# Patient Record
Sex: Female | Born: 1950
Health system: Southern US, Community
[De-identification: ages and names within clinical notes are randomized; demographics above are authoritative.]

## PROBLEM LIST (undated history)

## (undated) DIAGNOSIS — E785 Hyperlipidemia, unspecified: Secondary | ICD-10-CM

## (undated) DIAGNOSIS — M1990 Unspecified osteoarthritis, unspecified site: Secondary | ICD-10-CM

## (undated) DIAGNOSIS — C801 Malignant (primary) neoplasm, unspecified: Secondary | ICD-10-CM

## (undated) DIAGNOSIS — I10 Essential (primary) hypertension: Secondary | ICD-10-CM

## (undated) DIAGNOSIS — R079 Chest pain, unspecified: Principal | ICD-10-CM

## (undated) DIAGNOSIS — J189 Pneumonia, unspecified organism: Secondary | ICD-10-CM

## (undated) HISTORY — PX: ELBOW SURGERY: SHX618

## (undated) HISTORY — DX: Chest pain, unspecified: R07.9

## (undated) HISTORY — PX: BREAST BIOPSY: SHX20

## (undated) HISTORY — PX: APPENDECTOMY: SHX54

## (undated) HISTORY — PX: COLONOSCOPY: SHX174

## (undated) HISTORY — PX: WISDOM TOOTH EXTRACTION: SHX21

## (undated) HISTORY — PX: FOOT SURGERY: SHX648

---
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
## 1998-07-30 ENCOUNTER — Other Ambulatory Visit: Admission: RE | Admit: 1998-07-30 | Discharge: 1998-07-30 | Payer: Self-pay | Admitting: Gynecology

## 1998-08-17 ENCOUNTER — Other Ambulatory Visit: Admission: RE | Admit: 1998-08-17 | Discharge: 1998-08-17 | Payer: Self-pay | Admitting: Gynecology

## 1999-11-19 ENCOUNTER — Other Ambulatory Visit: Admission: RE | Admit: 1999-11-19 | Discharge: 1999-11-19 | Payer: Self-pay | Admitting: Gynecology

## 2000-01-27 ENCOUNTER — Encounter (INDEPENDENT_AMBULATORY_CARE_PROVIDER_SITE_OTHER): Payer: Self-pay | Admitting: *Deleted

## 2000-01-27 ENCOUNTER — Ambulatory Visit (HOSPITAL_BASED_OUTPATIENT_CLINIC_OR_DEPARTMENT_OTHER): Admission: RE | Admit: 2000-01-27 | Discharge: 2000-01-28 | Payer: Self-pay | Admitting: Orthopedic Surgery

## 2000-04-13 ENCOUNTER — Encounter: Admission: RE | Admit: 2000-04-13 | Discharge: 2000-04-13 | Payer: Self-pay | Admitting: Gynecology

## 2000-04-13 ENCOUNTER — Encounter: Payer: Self-pay | Admitting: Gynecology

## 2000-06-01 ENCOUNTER — Other Ambulatory Visit: Admission: RE | Admit: 2000-06-01 | Discharge: 2000-06-01 | Payer: Self-pay | Admitting: Gynecology

## 2001-01-21 ENCOUNTER — Other Ambulatory Visit: Admission: RE | Admit: 2001-01-21 | Discharge: 2001-01-21 | Payer: Self-pay | Admitting: Gynecology

## 2001-02-05 ENCOUNTER — Other Ambulatory Visit: Admission: RE | Admit: 2001-02-05 | Discharge: 2001-02-05 | Payer: Self-pay | Admitting: Gynecology

## 2001-02-05 ENCOUNTER — Encounter (INDEPENDENT_AMBULATORY_CARE_PROVIDER_SITE_OTHER): Payer: Self-pay

## 2001-04-19 ENCOUNTER — Encounter: Payer: Self-pay | Admitting: Gynecology

## 2001-04-19 ENCOUNTER — Encounter: Admission: RE | Admit: 2001-04-19 | Discharge: 2001-04-19 | Payer: Self-pay | Admitting: Gynecology

## 2002-04-20 ENCOUNTER — Encounter: Admission: RE | Admit: 2002-04-20 | Discharge: 2002-04-20 | Payer: Self-pay | Admitting: Gynecology

## 2002-04-20 ENCOUNTER — Encounter: Payer: Self-pay | Admitting: Gynecology

## 2002-05-03 ENCOUNTER — Other Ambulatory Visit: Admission: RE | Admit: 2002-05-03 | Discharge: 2002-05-03 | Payer: Self-pay | Admitting: Gynecology

## 2002-09-22 ENCOUNTER — Ambulatory Visit (HOSPITAL_COMMUNITY): Admission: RE | Admit: 2002-09-22 | Discharge: 2002-09-22 | Payer: Self-pay | Admitting: *Deleted

## 2003-05-09 ENCOUNTER — Encounter: Payer: Self-pay | Admitting: Gynecology

## 2003-05-09 ENCOUNTER — Encounter: Admission: RE | Admit: 2003-05-09 | Discharge: 2003-05-09 | Payer: Self-pay | Admitting: Gynecology

## 2003-07-13 ENCOUNTER — Other Ambulatory Visit: Admission: RE | Admit: 2003-07-13 | Discharge: 2003-07-13 | Payer: Self-pay | Admitting: Gynecology

## 2004-05-27 ENCOUNTER — Encounter: Admission: RE | Admit: 2004-05-27 | Discharge: 2004-05-27 | Payer: Self-pay | Admitting: Gynecology

## 2004-07-30 ENCOUNTER — Other Ambulatory Visit: Admission: RE | Admit: 2004-07-30 | Discharge: 2004-07-30 | Payer: Self-pay | Admitting: Gynecology

## 2005-06-02 ENCOUNTER — Encounter: Admission: RE | Admit: 2005-06-02 | Discharge: 2005-06-02 | Payer: Self-pay | Admitting: Gynecology

## 2005-06-12 ENCOUNTER — Encounter: Admission: RE | Admit: 2005-06-12 | Discharge: 2005-06-12 | Payer: Self-pay | Admitting: Gynecology

## 2006-06-18 ENCOUNTER — Encounter: Admission: RE | Admit: 2006-06-18 | Discharge: 2006-06-18 | Payer: Self-pay | Admitting: Gynecology

## 2006-09-04 ENCOUNTER — Emergency Department (HOSPITAL_COMMUNITY): Admission: EM | Admit: 2006-09-04 | Discharge: 2006-09-04 | Payer: Self-pay | Admitting: *Deleted

## 2007-06-30 ENCOUNTER — Encounter: Admission: RE | Admit: 2007-06-30 | Discharge: 2007-06-30 | Payer: Self-pay | Admitting: Orthopedic Surgery

## 2007-07-20 ENCOUNTER — Encounter: Admission: RE | Admit: 2007-07-20 | Discharge: 2007-07-20 | Payer: Self-pay | Admitting: Gynecology

## 2007-10-11 ENCOUNTER — Encounter: Admission: RE | Admit: 2007-10-11 | Discharge: 2007-10-11 | Payer: Self-pay | Admitting: Family Medicine

## 2007-10-18 ENCOUNTER — Encounter: Admission: RE | Admit: 2007-10-18 | Discharge: 2007-10-18 | Payer: Self-pay | Admitting: Family Medicine

## 2008-01-17 ENCOUNTER — Encounter: Admission: RE | Admit: 2008-01-17 | Discharge: 2008-01-17 | Payer: Self-pay | Admitting: Gastroenterology

## 2008-02-28 ENCOUNTER — Ambulatory Visit (HOSPITAL_COMMUNITY): Admission: RE | Admit: 2008-02-28 | Discharge: 2008-02-28 | Payer: Self-pay | Admitting: General Surgery

## 2008-02-28 ENCOUNTER — Encounter (INDEPENDENT_AMBULATORY_CARE_PROVIDER_SITE_OTHER): Payer: Self-pay | Admitting: General Surgery

## 2008-07-20 ENCOUNTER — Encounter: Admission: RE | Admit: 2008-07-20 | Discharge: 2008-07-20 | Payer: Self-pay | Admitting: Gynecology

## 2009-07-23 ENCOUNTER — Encounter: Admission: RE | Admit: 2009-07-23 | Discharge: 2009-07-23 | Payer: Self-pay | Admitting: Gynecology

## 2010-07-31 ENCOUNTER — Encounter: Admission: RE | Admit: 2010-07-31 | Discharge: 2010-07-31 | Payer: Self-pay | Admitting: Gynecology

## 2011-01-12 ENCOUNTER — Encounter: Payer: Self-pay | Admitting: Gynecology

## 2011-05-06 NOTE — Op Note (Signed)
Wall, Claire                ACCOUNT NO.:  [id number]   MEDICAL RECORD NO.:  [id number]          PATIENT TYPE:  AMB   LOCATION:  SDS                          FACILITY:  [facility]   PHYSICIAN:  Gabrielle Dare. Janee Morn, M.D.DATE OF BIRTH:  [birth year]   DATE OF PROCEDURE:  02/28/2008  DATE OF DISCHARGE:                               OPERATIVE REPORT   PREOPERATIVE DIAGNOSIS:  Appendix mucocele.   POSTOPERATIVE DIAGNOSIS:  Appendix mucocele.   PROCEDURE:  Laparoscopic appendectomy.   SURGEON:  Gabrielle Dare. Janee Morn, M.D.   ANESTHESIA:  General.   HISTORY OF PRESENT ILLNESS:  [name] is a 60 year old female who was  noted to have a cystic mass in her appendix on the CT scan.  This  persisted on a follow-up scan, and recommendation for appendectomy was  made.  She presents today to undergo elective laparoscopic appendectomy.   PROCEDURE IN DETAIL:  Informed consent was obtained.  Patient was  identified in the preoperative holding area, and she received  intravenous antibiotics.  She was brought to the operating room.  General anesthesia was administered.  Her abdomen was prepped and draped  in a sterile fashion.  The infraumbilical region was infiltrated with  0.25% Marcaine with epinephrine.  An infraumbilical incision was made.  Subcutaneous tissues were dissected down, revealing the anterior fascia.  This was divided sharply along the midline, and the peritoneal cavity  was entered under direct vision without difficulty.  A 0 Vicryl purse-  string suture was placed around the fascial opening.  The Hasson trocar  was inserted into the abdomen, and the abdomen was insufflated with  carbon dioxide in a standard fashion.  Under direct vision, a 12 mm left  lower quadrant and a 5 mm right mid abdominal port were placed.  The  0.25% Marcaine with epinephrine was used at all port sites.  Laparoscopic exploration revealed the appendix to not be significantly  inflamed.  The base was dissected  initially and then divided with the  endoscopic GIA stapler with the vascular load, achieving an excellent  closure.  The mesoappendix was then gradually divided with the harmonic  scalpel, achieving excellent hemostasis.  The appendix was quite long  and extended up along the right gutter.  It was carefully dissected away  from surrounding structures, and the mesentery was divided sequentially  with the harmonic scalpel.  The tip was all the way up in the right  upper quadrant.  The appendix was removed in one piece and placed in an  EndoCatch bag and taken out of the abdomen via the left lower quadrant  port site.  It was sent to pathology.  The abdomen was copiously  irrigated.  Hemostasis was insured.  The staple line on the cecum was  inspected, and it appeared completely intact.  The remainder of the  irrigation fluid was evacuated, and it was clear.  No other significant  abnormalities were seen in the right colon.  The ports were then removed  under direct vision.  The pneumoperitoneum was released.  The Hasson  trocar was removed from the abdomen.  The infraumbilical fascia was  closed by time and 0 Vicryl pressuring suture with care not to trap any  intra-abdominal contents.  All three wounds were copiously irrigated.  The left lower quadrant and infraumbilical wound were closed  with a running 4-0 Vicryl subcuticular stitch, then Dermabond was placed  on all three wounds.  Sponge, needle, and instrument counts were all  correct.  The patient tolerated the procedure well without apparent  complication and was taken to the recovery room in stable condition.      Gabrielle Dare Janee Morn, M.D.  Electronically Signed     BET/MEDQ  D:  02/28/2008  T:  02/29/2008  Job:  [id number]   cc:   Shirley Friar, MD  Anna Genre Little, M.D.

## 2011-05-09 NOTE — Op Note (Signed)
. [facility]  Patient:    Claire Wall Visit Number: [id number] MRN: [id number]          Service Type: DSU Location: [facility] Attending Physician:  Marlowe Kays Page Proc. Date: 01/27/00 Admit Date:  01/27/2000                             Operative Report  PREOPERATIVE DIAGNOSIS:  Chronic lateral epicondylitis of left elbow.  POSTOPERATIVE DIAGNOSIS:  Chronic lateral epicondylitis of left elbow.  OPERATION:  Reconstruction of common extensor tendon with partial lateral epicondylectomy, left elbow.  SURGEON:  Illene Labrador. Aplington, M.D.  ASSISTANT:  Nurse.  ANESTHESIA:  General.  JUSTIFICATION FOR PROCEDURE:  She had a similar problem on the right elbow years ago which I repaired, and she has done well with it.  She has had progressive pain over the lateral left elbow, refractory to nonsurgical treatment with x-rays being unremarkable, and she is here today for a similar procedure on the left.  See operative description below for pathology.  DESCRIPTION OF PROCEDURE:  After satisfactory general anesthesia, pneumatic tourniquet and Betadine prep from wrist ________ was draped as a sterile field. I marked out a curved incision midway between the lateral epicondyle and the olecranon.  The incision was carried down through the fascia at this level and he common extensor tendon exposed.  Externally, there did not appear to be anything unusual except for several bleeding vessels consistent with chronic inflammation. First, the cutting cautery and then with a 15 knife blade, I made a horizontal,  U-type incision, dissecting the common extensor tendon off the lateral epicondyle. I got close to the elbow joint.  There was a significant gap in the common extensor tendon with glistening chronic fibrinous changes.  All of this was consistent with a pain pattern.  I stripped the tendon back to the elbow joint which was examined and was found  to be normal.  With sharp dissection, I then dissected out all the chronically damaged common extensor tendon and sent it to pathology, leaving healthy glistening tendon remaining.  With the 5 inch curved osteotome, I then performed a partial lateral epicondylectomy, getting a nice raw bleeding bone surface, to which I could attach the debrided common extensor tendon.  I made two parallel drill holes proximally at the lateral epicondyle which remained, and then brought a #1 Vicryl suture through the top hole posteriorly and proximally, up through the hole through the raw bone, and then rolled it through the common extensor tendon, and then brought it back out through the second inferior two holes.  With some traction with a hemostat on the suture and the elbow flexed, I then brought the common extensor tendon over the raw bone, and repaired the wings of the U with multiple interrupted #1 Vicryl, giving a nice secure reattachment.  I then tied the suture through bone.  The wound was irrigated with sterile saline, as it had been throughout the case, and I then reapproximated the fascia with interrupted 3-0 Vicryl.  All soft tissues were infiltrated with 0.5% plain Marcaine.  She was given 30 mg of Toradol IV.  I then continued the closure subcutaneously with 3-0 Vicryl and the skin with Steri-Strips.  A dry sterile dressing, elbow pad with a long arm splint and cast were applied.  She tolerated the procedure well and was taken to the recovery room in satisfactory condition with no known complications.  Attending Physician:  Joaquin Courts DD:  01/27/00 TD:  01/28/00 Job: [id number]

## 2011-07-09 ENCOUNTER — Other Ambulatory Visit: Payer: Self-pay | Admitting: Gynecology

## 2011-07-09 DIAGNOSIS — Z1231 Encounter for screening mammogram for malignant neoplasm of breast: Secondary | ICD-10-CM

## 2011-08-08 ENCOUNTER — Ambulatory Visit
Admission: RE | Admit: 2011-08-08 | Discharge: 2011-08-08 | Disposition: A | Discharge status: Home / Self Care | Payer: Managed Care, Other (non HMO) | Source: Ambulatory Visit | Attending: Gynecology | Admitting: Gynecology

## 2011-08-08 DIAGNOSIS — Z1231 Encounter for screening mammogram for malignant neoplasm of breast: Secondary | ICD-10-CM

## 2011-09-15 LAB — BASIC METABOLIC PANEL
BUN: 11
CO2: 30
Creatinine, Ser: 0.69
GFR calc non Af Amer: >60
Potassium: 5.1

## 2011-09-15 LAB — CBC
Hemoglobin: 14
MCHC: 34
RBC: 4.69
RDW: 14
WBC: 6.7

## 2011-10-20 ENCOUNTER — Other Ambulatory Visit: Payer: Self-pay | Admitting: Gynecology

## 2012-09-23 ENCOUNTER — Other Ambulatory Visit: Payer: Self-pay | Admitting: Gynecology

## 2012-09-23 DIAGNOSIS — Z1231 Encounter for screening mammogram for malignant neoplasm of breast: Secondary | ICD-10-CM

## 2012-10-18 ENCOUNTER — Ambulatory Visit
Admission: RE | Admit: 2012-10-18 | Discharge: 2012-10-18 | Disposition: A | Discharge status: Home / Self Care | Payer: BC Managed Care – PPO | Source: Ambulatory Visit | Attending: Gynecology | Admitting: Gynecology

## 2012-10-18 DIAGNOSIS — Z1231 Encounter for screening mammogram for malignant neoplasm of breast: Secondary | ICD-10-CM

## 2013-03-21 ENCOUNTER — Other Ambulatory Visit: Payer: Self-pay | Admitting: Gastroenterology

## 2013-11-16 ENCOUNTER — Other Ambulatory Visit: Payer: Self-pay

## 2013-11-16 DIAGNOSIS — Z1231 Encounter for screening mammogram for malignant neoplasm of breast: Secondary | ICD-10-CM

## 2013-12-22 DIAGNOSIS — R079 Chest pain, unspecified: Secondary | ICD-10-CM

## 2013-12-22 HISTORY — DX: Chest pain, unspecified: R07.9

## 2013-12-26 ENCOUNTER — Ambulatory Visit
Admission: RE | Admit: 2013-12-26 | Discharge: 2013-12-26 | Disposition: A | Discharge status: Home / Self Care | Payer: BC Managed Care – PPO | Source: Ambulatory Visit

## 2013-12-26 DIAGNOSIS — Z1231 Encounter for screening mammogram for malignant neoplasm of breast: Secondary | ICD-10-CM

## 2014-02-22 ENCOUNTER — Encounter (HOSPITAL_COMMUNITY): Payer: Self-pay | Admitting: Emergency Medicine

## 2014-02-22 ENCOUNTER — Emergency Department (HOSPITAL_COMMUNITY)
Admission: EM | Admit: 2014-02-22 | Discharge: 2014-02-22 | Disposition: A | Discharge status: Home / Self Care | Payer: BC Managed Care – PPO | Attending: Emergency Medicine | Admitting: Emergency Medicine

## 2014-02-22 ENCOUNTER — Emergency Department (HOSPITAL_COMMUNITY): Payer: BC Managed Care – PPO

## 2014-02-22 DIAGNOSIS — R072 Precordial pain: Secondary | ICD-10-CM | POA: Insufficient documentation

## 2014-02-22 DIAGNOSIS — I10 Essential (primary) hypertension: Secondary | ICD-10-CM | POA: Insufficient documentation

## 2014-02-22 DIAGNOSIS — R51 Headache: Secondary | ICD-10-CM | POA: Insufficient documentation

## 2014-02-22 DIAGNOSIS — R079 Chest pain, unspecified: Secondary | ICD-10-CM

## 2014-02-22 DIAGNOSIS — Z888 Allergy status to other drugs, medicaments and biological substances status: Secondary | ICD-10-CM | POA: Insufficient documentation

## 2014-02-22 DIAGNOSIS — Z79899 Other long term (current) drug therapy: Secondary | ICD-10-CM | POA: Insufficient documentation

## 2014-02-22 DIAGNOSIS — R209 Unspecified disturbances of skin sensation: Secondary | ICD-10-CM | POA: Insufficient documentation

## 2014-02-22 HISTORY — DX: Essential (primary) hypertension: I10

## 2014-02-22 LAB — I-STAT TROPONIN, ED: Troponin i, poc: 0.01 ng/mL (ref 0.00–0.08)

## 2014-02-22 LAB — CBC
HEMATOCRIT: 37.6 % (ref 36.0–46.0)
Hemoglobin: 12.8 g/dL (ref 12.0–15.0)
MCH: 30.3 pg (ref 26.0–34.0)
MCHC: 34 g/dL (ref 30.0–36.0)
MCV: 89.1 fL (ref 78.0–100.0)
Platelets: 194 10*3/uL (ref 150–400)
RBC: 4.22 MIL/uL (ref 3.87–5.11)
RDW: 13.3 % (ref 11.5–15.5)
WBC: 4.4 10*3/uL (ref 4.0–10.5)

## 2014-02-22 LAB — BASIC METABOLIC PANEL
BUN: 13 mg/dL (ref 6–23)
CHLORIDE: 105 meq/L (ref 96–112)
CO2: 24 mEq/L (ref 19–32)
Calcium: 9.5 mg/dL (ref 8.4–10.5)
Creatinine, Ser: 0.55 mg/dL (ref 0.50–1.10)
GFR calc Af Amer: >90 mL/min (ref >90)
GFR calc non Af Amer: >90 mL/min (ref >90)
GLUCOSE: 102 mg/dL — AB (ref 70–99)
POTASSIUM: 4.2 meq/L (ref 3.7–5.3)
SODIUM: 141 meq/L (ref 137–147)

## 2014-02-22 LAB — TROPONIN I

## 2014-02-22 MED ORDER — ONDANSETRON HCL 4 MG/2ML IJ SOLN: 4.0000 mg | Freq: Once | INTRAMUSCULAR | Status: DC

## 2014-02-22 MED ORDER — MORPHINE SULFATE 2 MG/ML IJ SOLN: 2.0000 mg | Freq: Once | INTRAMUSCULAR | Status: DC

## 2014-02-22 MED ORDER — SODIUM CHLORIDE 0.9 % IV BOLUS (SEPSIS)
1000.0000 mL | Freq: Once | INTRAVENOUS | Status: AC
Start: 2014-02-22 — End: 2014-02-22
  Administered 2014-02-22: 1000 mL via INTRAVENOUS

## 2014-02-22 MED ORDER — MORPHINE SULFATE 2 MG/ML IJ SOLN
2.0000 mg | Freq: Once | INTRAMUSCULAR | Status: AC
  Administered 2014-02-22: 2 mg via INTRAVENOUS
  Filled 2014-02-22: qty 1

## 2014-02-22 MED ORDER — NITROGLYCERIN 0.4 MG SL SUBL: 0.4000 mg | SUBLINGUAL_TABLET | SUBLINGUAL | Status: DC | PRN

## 2014-02-22 MED ORDER — ONDANSETRON HCL 4 MG/2ML IJ SOLN
4.0000 mg | Freq: Once | INTRAMUSCULAR | Status: AC
Start: 2014-02-22 — End: 2014-02-22
  Administered 2014-02-22: 4 mg via INTRAVENOUS
  Filled 2014-02-22: qty 2

## 2014-02-22 MED ORDER — ASPIRIN 325 MG PO TABS
325.0000 mg | ORAL_TABLET | ORAL | Status: AC
  Administered 2014-02-22: 325 mg via ORAL
  Filled 2014-02-22: qty 1

## 2014-02-22 NOTE — ED Notes (Signed)
Vital signs stable. 

## 2014-02-22 NOTE — Discharge Instructions (Signed)
Please call your doctor for a followup appointment within 24-48 hours. When you talk to your doctor please let them know that you were seen in the emergency department and have them acquire all of your records so that they can discuss the findings with you and formulate a treatment plan to fully care for your new and ongoing problems. Please call and set-up an appointment with your primary care provider to be re-assessed within the nect 24-48 hours Please call and set-up an appointment with Cardiology - will most likely need to get echocardiogram performed Please rest and stay hydrated Can take baby aspirin, 81 mg, daily Please continue to monitor symptoms closely and if symptoms are to worsen or change (fever greater than 101, chills, chest pain, shortness of breath, difficulty breathing, neck pain, pain radiating down the left arm,, sweating, dizziness, fainting) please report back to emergency apartment immediately   Chest Pain (Nonspecific) It is often hard to give a specific diagnosis for the cause of chest pain. There is always a chance that your pain could be related to something serious, such as a heart attack or a blood clot in the lungs. You need to follow up with your caregiver for further evaluation. CAUSES   Heartburn.  Pneumonia or bronchitis.  Anxiety or stress.  Inflammation around your heart (pericarditis) or lung (pleuritis or pleurisy).  A blood clot in the lung.  A collapsed lung (pneumothorax). It can develop suddenly on its own (spontaneous pneumothorax) or from injury (trauma) to the chest.  Shingles infection (herpes zoster virus). The chest wall is composed of bones, muscles, and cartilage. Any of these can be the source of the pain.  The bones can be bruised by injury.  The muscles or cartilage can be strained by coughing or overwork.  The cartilage can be affected by inflammation and become sore (costochondritis). DIAGNOSIS  Lab tests or other studies, such  as X-rays, electrocardiography, stress testing, or cardiac imaging, may be needed to find the cause of your pain.  TREATMENT   Treatment depends on what may be causing your chest pain. Treatment may include:  Acid blockers for heartburn.  Anti-inflammatory medicine.  Pain medicine for inflammatory conditions.  Antibiotics if an infection is present.  You may be advised to change lifestyle habits. This includes stopping smoking and avoiding alcohol, caffeine, and chocolate.  You may be advised to keep your head raised (elevated) when sleeping. This reduces the chance of acid going backward from your stomach into your esophagus.  Most of the time, nonspecific chest pain will improve within 2 to 3 days with rest and mild pain medicine. HOME CARE INSTRUCTIONS   If antibiotics were prescribed, take your antibiotics as directed. Finish them even if you start to feel better.  For the next few days, avoid physical activities that bring on chest pain. Continue physical activities as directed.  Do not smoke.  Avoid drinking alcohol.  Only take over-the-counter or prescription medicine for pain, discomfort, or fever as directed by your caregiver.  Follow your caregiver's suggestions for further testing if your chest pain does not go away.  Keep any follow-up appointments you made. If you do not go to an appointment, you could develop lasting (chronic) problems with pain. If there is any problem keeping an appointment, you must call to reschedule. SEEK MEDICAL CARE IF:   You think you are having problems from the medicine you are taking. Read your medicine instructions carefully.  Your chest pain does not go away,  even after treatment.  You develop a rash with blisters on your chest. SEEK IMMEDIATE MEDICAL CARE IF:   You have increased chest pain or pain that spreads to your arm, neck, jaw, back, or abdomen.  You develop shortness of breath, an increasing cough, or you are coughing up  blood.  You have severe back or abdominal pain, feel nauseous, or vomit.  You develop severe weakness, fainting, or chills.  You have a fever. THIS IS AN EMERGENCY. Do not wait to see if the pain will go away. Get medical help at once. Call your local emergency services (911 in [place] Do not drive yourself to the hospital. MAKE SURE YOU:   Understand these instructions.  Will watch your condition.  Will get help right away if you are not doing well or get worse. Document Released: 09/17/2005 Document Revised: 03/01/2012 Document Reviewed: 07/13/2008 [facility] Patient Information 2014 [place].

## 2014-02-22 NOTE — ED Notes (Signed)
Marissa, PA at bedside. 

## 2014-02-22 NOTE — ED Provider Notes (Signed)
CSN: [id number]     Arrival date & time 02/22/14  4008 History   First MD Initiated Contact with Patient 02/22/14 [phone]     Chief Complaint  Patient presents with  . Chest Pain     (Consider location/radiation/quality/duration/timing/severity/associated sxs/prior Treatment) The history is provided by the patient. No language interpreter was used.  Claire Wall is a 63 year old female with past medical history of hypertension, breast biopsy of the left breast with negative findings of cancer presenting to the ED with abrupt onset of chest pain at approximately 4:45 AM this morning. Stated that the pain with a stabbing sensation to the Center the chest is constant. Stated that the pain has now been alleviated with the use of aspirin, is now described as a dull sensation that worsens with deep inhalation. Stated that when she takes a deep breath in the pain as a stabbing sensation. Stated that she had a headache with the onset of the chest pain that has now been alleviated. Denied radiation. Stated that she has family history of cardiac issues-father had many cardiac issues with CABG x3. Denied shortness of breath, difficulty breathing, nausea, vomiting, diarrhea, syncope, visual distortions, dizziness, diaphoresis. Denied smoking. Reported history of birth control many years ago. PCP Dr. Rex Kras  Past Medical History  Diagnosis Date  . Hypertension    Past Surgical History  Procedure Laterality Date  . Appendectomy    . Breast biopsy Left   . Elbow surgery     No family history on file. History  Substance Use Topics  . Smoking status: Never Smoker   . Smokeless tobacco: Not on file  . Alcohol Use: Yes   OB History   Grav Para Term Preterm Abortions TAB SAB Ect Mult Living                 Review of Systems  Constitutional: Negative for fever and chills.  Respiratory: Negative for chest tightness and shortness of breath.   Cardiovascular: Positive for chest pain.  Gastrointestinal:  Negative for nausea, vomiting, abdominal pain and diarrhea.  Neurological: Positive for headaches. Negative for syncope and weakness.  All other systems reviewed and are negative.      Allergies  Codeine  Home Medications   Current Outpatient Rx  Name  Route  Sig  Dispense  Refill  . ramipril (ALTACE) 10 MG capsule   Oral   Take 10 mg by mouth daily.          . simvastatin (ZOCOR) 20 MG tablet   Oral   Take 20 mg by mouth daily.           BP 120/74  Pulse 65  Temp(Src) 97.7 F (36.5 C) (Oral)  Resp 17  SpO2 97% Physical Exam  Nursing note and vitals reviewed. Constitutional: She is oriented to person, place, and time. She appears well-developed and well-nourished. No distress.  HENT:  Head: Normocephalic.  Mouth/Throat: Oropharynx is clear and moist. No oropharyngeal exudate.  Eyes: Conjunctivae and EOM are normal. Pupils are equal, round, and reactive to light. Right eye exhibits no discharge. Left eye exhibits no discharge.  Neck: Normal range of motion. Neck supple. No tracheal deviation present.  Cardiovascular: Normal rate, regular rhythm and normal heart sounds.  Exam reveals no friction rub.   No murmur heard. Pulses:      Radial pulses are 2+ on the right side, and 2+ on the left side.       Dorsalis pedis pulses are 2+ on  the right side, and 2+ on the left side.  Negative swelling or pitting edema localized to the lower extremities bilaterally  Pulmonary/Chest: Effort normal and breath sounds normal. No respiratory distress. She has no wheezes. She has no rales. She exhibits tenderness (Pain reproducible upon palpation to the sternum.).    Musculoskeletal: Normal range of motion.  Full ROM to upper and lower extremities without difficulty noted, negative ataxia noted.  Lymphadenopathy:    She has no cervical adenopathy.  Neurological: She is alert and oriented to person, place, and time. No cranial nerve deficit. She exhibits normal muscle tone.  Coordination normal.  Cranial nerves III-XII grossly intact Strength 5+/5+ to upper and lower extremities bilaterally with resistance applied, equal distribution noted.  Skin: Skin is warm and dry. No rash noted. She is not diaphoretic. No erythema.  Psychiatric: She has a normal mood and affect. Her behavior is normal. Thought content normal.    ED Course  Procedures (including critical care time)  11:27 AM This provider re-assessed the patient. Patient reported that she is feeling well and ready to go home. Patient is chest pain free.  Discussed labs, imaging and EKG in great detail with attending physician who agreed to patient being discharged.  Results for orders placed during the hospital encounter of 02/22/14  CBC      Result Value Ref Range   WBC 4.4  4.0 - 10.5 K/uL   RBC 4.22  3.87 - 5.11 MIL/uL   Hemoglobin 12.8  12.0 - 15.0 g/dL   HCT 37.6  36.0 - 46.0 %   MCV 89.1  78.0 - 100.0 fL   MCH 30.3  26.0 - 34.0 pg   MCHC 34.0  30.0 - 36.0 g/dL   RDW 13.3  11.5 - 15.5 %   Platelets 194  150 - 400 K/uL  BASIC METABOLIC PANEL      Result Value Ref Range   Sodium 141  137 - 147 mEq/L   Potassium 4.2  3.7 - 5.3 mEq/L   Chloride 105  96 - 112 mEq/L   CO2 24  19 - 32 mEq/L   Glucose, Bld 102 (*) 70 - 99 mg/dL   BUN 13  6 - 23 mg/dL   Creatinine, Ser 0.55  0.50 - 1.10 mg/dL   Calcium 9.5  8.4 - 10.5 mg/dL   GFR calc non Af Amer >90  >90 mL/min   GFR calc Af Amer >90  >90 mL/min  TROPONIN I      Result Value Ref Range   Troponin I <0.30  <0.30 ng/mL  TROPONIN I      Result Value Ref Range   Troponin I <0.30  <0.30 ng/mL  I-STAT TROPOININ, ED      Result Value Ref Range   Troponin i, poc 0.01  0.00 - 0.08 ng/mL   Comment 3            Dg Chest 2 View  02/22/2014   CLINICAL DATA:  Chest pain and shortness of breath  EXAM: CHEST  2 VIEW  COMPARISON:  09/04/2006  FINDINGS: Normal heart size and mediastinal contours. No acute infiltrate or edema. No effusion or pneumothorax. No  acute osseous findings.  IMPRESSION: No active cardiopulmonary disease.   Electronically Signed   By: Jorje Guild M.D.   On: 02/22/2014 06:48   Labs Review Labs Reviewed  BASIC METABOLIC PANEL - Abnormal; Notable for the following:    Glucose, Bld 102 (*)    All  other components within normal limits  CBC  TROPONIN I  TROPONIN I  I-STAT TROPOININ, ED   Imaging Review Dg Chest 2 View  02/22/2014   CLINICAL DATA:  Chest pain and shortness of breath  EXAM: CHEST  2 VIEW  COMPARISON:  09/04/2006  FINDINGS: Normal heart size and mediastinal contours. No acute infiltrate or edema. No effusion or pneumothorax. No acute osseous findings.  IMPRESSION: No active cardiopulmonary disease.   Electronically Signed   By: Jorje Guild M.D.   On: 02/22/2014 06:48     EKG Interpretation   Date/Time:  Wednesday February 22 2014 06:19:58 EST Ventricular Rate:  68 PR Interval:  148 QRS Duration: 92 QT Interval:  412 QTC Calculation: 438 R Axis:   84 Text Interpretation:  Normal sinus rhythm Normal ECG No significant change  since last tracing Confirmed by HARRISON  MD, FORREST ([id number]) on 02/22/2014  7:05:46 AM      MDM   Final diagnoses:  Chest pain   Medications  aspirin tablet 325 mg (325 mg Oral Given 02/22/14 0702)  sodium chloride 0.9 % bolus 1,000 mL (1,000 mLs Intravenous New Bag/Given 02/22/14 0835)  morphine 2 MG/ML injection 2 mg (2 mg Intravenous Given 02/22/14 0842)  ondansetron (ZOFRAN) injection 4 mg (4 mg Intravenous Given 02/22/14 0843)   Filed Vitals:   02/22/14 0833 02/22/14 0900 02/22/14 0915 02/22/14 1005  BP: 116/76 134/67 127/61 120/74  Pulse: 62 65 59 65  Temp:      TempSrc:      Resp: 12 14 11 17   SpO2: 99% 99% 99% 97%    Patient presenting to the emergency department with abrupt onset of chest pain approximately 4:45 AM this morning. Stated that the pain is localized to the Center of her chest without radiation described as a stabbing sensation that has been  alleviated once aspirin was administered. Stated there is a mild dull aching sensation underlying. Reported pain worse with deep inhalation described as a stabbing sensation. Alert and oriented. GCS 15. Patient found sitting comfortably upright in bed. Heart rate and rhythm normal. Radial and DP pulses 2+ bilaterally. Negative swelling or pitting edema localized to bilateral lower extremities. Lungs clear to auscultation to upper and lower lobes bilaterally. Pain reproducible upon palpation to the sternum of the chest. Full range of motion to upper and lower extremities bilaterally. Strength intact with equal distribution. EKG noted normal sinus rhythm with a heart rate of 60 beats per minute-negative ischemic findings noted. First troponin negative elevation. Second troponin negative elevation. CBC negative findings. BMP negative findings. Chest x-ray negative for acute cardiopulmonary disease. Well's score low - doubt PE. HEART score 2. Pain reproducible upon palpation to the center of the chest - appears to be muscular in nature. Patient chest pain free. Discussed case with attending physician who agreed to discharge plan. Patient stable, afebrile. Discharged patient. Referred patient to primary care provider and cardiology - discussed Holter monitor and echocardiogram. Discussed with patient to rest and stay hydrated. Recommended baby aspirin daily. Discussed with patient to closely monitor symptoms and if symptoms are to worsen or change to report back to the ED - strict return instructions given.  Patient agreed to plan of care, understood, all questions answered.    Jamse Mead, PA-C 02/22/14 2119  Jamse Mead, PA-C 02/22/14 2137

## 2014-02-22 NOTE — ED Notes (Signed)
Pt reports she woke up this morning at 5am with non-radiating chest pain in middle of chest, HA and SOB.

## 2014-02-23 NOTE — ED Provider Notes (Signed)
Medical screening examination/treatment/procedure(s) were performed by non-physician practitioner and as supervising physician I was immediately available for consultation/collaboration.   EKG Interpretation   Date/Time:  Wednesday February 22 2014 06:19:58 EST Ventricular Rate:  68 PR Interval:  148 QRS Duration: 92 QT Interval:  412 QTC Calculation: 438 R Axis:   84 Text Interpretation:  Normal sinus rhythm Normal ECG No significant change  since last tracing Confirmed by HARRISON  MD, FORREST ([id number]) on 02/22/2014  7:05:46 AM      Rolland Porter, MD, Alanson Aly, MD 02/23/14 1536

## 2014-03-15 ENCOUNTER — Ambulatory Visit (INDEPENDENT_AMBULATORY_CARE_PROVIDER_SITE_OTHER): Payer: BC Managed Care – PPO | Admitting: Cardiovascular Disease

## 2014-03-15 ENCOUNTER — Encounter: Payer: Self-pay | Admitting: *Deleted

## 2014-03-15 ENCOUNTER — Telehealth: Payer: Self-pay | Admitting: Cardiology

## 2014-03-15 VITALS — BP 140/82 | HR 68 | Ht 69.0 in | Wt 176.4 lb

## 2014-03-15 DIAGNOSIS — R079 Chest pain, unspecified: Secondary | ICD-10-CM

## 2014-03-15 DIAGNOSIS — I10 Essential (primary) hypertension: Secondary | ICD-10-CM

## 2014-03-15 DIAGNOSIS — R0602 Shortness of breath: Secondary | ICD-10-CM

## 2014-03-15 DIAGNOSIS — M79609 Pain in unspecified limb: Secondary | ICD-10-CM

## 2014-03-15 DIAGNOSIS — M79606 Pain in leg, unspecified: Secondary | ICD-10-CM

## 2014-03-15 LAB — D-DIMER, QUANTITATIVE: D-Dimer, Quant: 0.27 ug/mL-FEU (ref 0.00–0.48)

## 2014-03-15 NOTE — Patient Instructions (Addendum)
Your physician recommends that you schedule a follow-up appointment in: AS NEEDED  Your physician recommends that you continue on your current medications as directed. Please refer to the Current Medication list given to you today. Your physician has requested that you have a stress echocardiogram. For further information please visit [email or web address]. Please follow instruction sheet as given.  Your physician recommends that you return for lab work in:   [facility] has requested that you have a lower or upper extremity venous duplex. This test is an ultrasound of the veins in the legs or arms. It looks at venous blood flow that carries blood from the heart to the legs or arms. Allow one hour for a Lower Venous exam. Allow thirty minutes for an Upper Venous exam. There are no restrictions or special instructions.  LOWER

## 2014-03-15 NOTE — Assessment & Plan Note (Signed)
Well controlled.  Continue current medications and low sodium Dash type diet.    

## 2014-03-15 NOTE — Assessment & Plan Note (Signed)
Etiology not clear.  She travels a lot and had flown to [place] the week before and [facility] after ER visit  Check d dimer and LE venous duplex  F/U stress echo r/o CAD

## 2014-03-15 NOTE — Telephone Encounter (Signed)
Pt's ddimer was normal at 0.257  Pt notified

## 2014-03-15 NOTE — Progress Notes (Signed)
Patient ID: Claire Wall, female   DOB: 1951-02-24, 63 y.o.   MRN: [id number] Claire Wall is a 63 year old female with past medical history of hypertension, breast biopsy of the left breast with negative findings of cancer presenting to the ED on 02/22/14  with abrupt onset of chest pain at approximately 4:45 AM  Stated that the pain with a stabbing sensation to the Center the chest is constant. Stated that the pain has now been alleviated with the use of aspirin, is now described as a dull sensation that worsens with deep inhalation. Stated that when she takes a deep breath in the pain as a stabbing sensation. Stated that she had a headache with the onset of the chest pain that has now been alleviated. Denied radiation. Stated that she has family history of cardiac issues-father had many cardiac issues with CABG x3. Denied shortness of breath, difficulty breathing, nausea, vomiting, diarrhea, syncope, visual distortions, dizziness, diaphoresis. Denied smoking. Reported history of birth control many years ago.  R/O  CXR NAD ECG normal    ROS: Denies fever, malais, weight loss, blurry vision, decreased visual acuity, cough, sputum, SOB, hemoptysis, pleuritic pain, palpitaitons, heartburn, abdominal pain, melena, lower extremity edema, claudication, or rash.  All other systems reviewed and negative  General: Affect appropriate Healthy:  appears stated age 63: normal Neck supple with no adenopathy JVP normal no bruits no thyromegaly Lungs clear with no wheezing and good diaphragmatic motion Heart:  S1/S2 no murmur, no rub, gallop or click PMI normal Abdomen: benighn, BS positve, no tenderness, no AAA no bruit.  No HSM or HJR Distal pulses intact with no bruits No edema Neuro non-focal Skin warm and dry No muscular weakness   Current Outpatient Prescriptions  Medication Sig Dispense Refill  . aspirin 81 MG tablet Take 81 mg by mouth daily.      . Calcium Carbonate-Vit D-Min (CALCIUM  1200 PO) Take by mouth.      . Coenzyme Q10 (CO Q-10 PO) Take by mouth.      . Omega-3 Fatty Acids (FISH OIL TRIPLE STRENGTH) 1400 MG CAPS Take by mouth daily.      . ramipril (ALTACE) 10 MG capsule Take 10 mg by mouth daily.       . simvastatin (ZOCOR) 20 MG tablet Take 20 mg by mouth daily.       . traZODone (DESYREL) 100 MG tablet Take 100 mg by mouth.       . Vitamin D, Ergocalciferol, (DRISDOL) 50000 UNITS CAPS capsule Take 50,000 Units by mouth every 7 (seven) days.       No current facility-administered medications for this visit.    Allergies  Codeine  Electrocardiogram:  3/5  SR rate 68 normal   Assessment and Plan

## 2014-03-16 ENCOUNTER — Ambulatory Visit (HOSPITAL_COMMUNITY): Payer: BC Managed Care – PPO | Attending: Internal Medicine | Admitting: *Deleted

## 2014-03-16 ENCOUNTER — Encounter: Payer: Self-pay | Admitting: Internal Medicine

## 2014-03-16 DIAGNOSIS — R609 Edema, unspecified: Secondary | ICD-10-CM

## 2014-03-16 DIAGNOSIS — M79609 Pain in unspecified limb: Secondary | ICD-10-CM | POA: Insufficient documentation

## 2014-03-16 DIAGNOSIS — M79606 Pain in leg, unspecified: Secondary | ICD-10-CM

## 2014-03-16 NOTE — Progress Notes (Signed)
Lower extremity venous duplex bilateral complete

## 2014-03-20 ENCOUNTER — Telehealth: Payer: Self-pay | Admitting: Cardiovascular Disease

## 2014-03-20 ENCOUNTER — Telehealth: Payer: Self-pay | Admitting: *Deleted

## 2014-03-20 NOTE — Telephone Encounter (Signed)
         Claire Wall ','<More Detail >>       Josue Hector, MD       Sent: Fri March 17, 2014 11:11 PM    To: Richmond Campbell, LPN                   Message     Normal d dimer        ----- Message -----    From: Richmond Campbell, LPN    Sent: 06/10/3558 3:43 PM    To: Josue Hector, MD                                  PT  AWARE OF  D DIMER RESULTS./CY

## 2014-03-20 NOTE — Telephone Encounter (Signed)
New message  ° ° °Returning call back to nurse.  °

## 2014-03-21 NOTE — Telephone Encounter (Signed)
PT  AWARE OF LOWER  EXTREMITY   DUPLEX  RESULTS .Claire Wall

## 2014-04-07 ENCOUNTER — Ambulatory Visit (HOSPITAL_COMMUNITY): Payer: BC Managed Care – PPO | Attending: Cardiology | Admitting: Radiology

## 2014-04-07 DIAGNOSIS — R072 Precordial pain: Secondary | ICD-10-CM

## 2014-04-07 DIAGNOSIS — R079 Chest pain, unspecified: Secondary | ICD-10-CM

## 2014-04-07 NOTE — Progress Notes (Signed)
Stress Echocardiogram performed.  

## 2014-04-20 ENCOUNTER — Telehealth: Payer: Self-pay | Admitting: Cardiovascular Disease

## 2014-04-20 NOTE — Progress Notes (Signed)
Quick Note:  Patient notified of test results from recent Stress Echo. Results per Dr. Johnsie Cancel, "normal stress echo". Patient verbalized understanding and appreciation for call back. ______

## 2014-04-20 NOTE — Telephone Encounter (Signed)
New message     Returning a nurses call to get test results.  She called last week.

## 2014-04-20 NOTE — Telephone Encounter (Signed)
Patient notified of test results from recent Stress Echo. Results per Dr. Johnsie Cancel, "normal stress echo".  Patient verbalized understanding and appreciation for call back.

## 2014-05-17 ENCOUNTER — Ambulatory Visit (INDEPENDENT_AMBULATORY_CARE_PROVIDER_SITE_OTHER): Payer: BC Managed Care – PPO | Admitting: Podiatry

## 2014-05-17 ENCOUNTER — Ambulatory Visit (INDEPENDENT_AMBULATORY_CARE_PROVIDER_SITE_OTHER): Payer: BC Managed Care – PPO

## 2014-05-17 ENCOUNTER — Encounter: Payer: Self-pay | Admitting: Podiatry

## 2014-05-17 VITALS — BP 130/77 | HR 63 | Resp 16 | Ht 70.0 in | Wt 170.0 lb

## 2014-05-17 DIAGNOSIS — M779 Enthesopathy, unspecified: Secondary | ICD-10-CM

## 2014-05-17 DIAGNOSIS — M2040 Other hammer toe(s) (acquired), unspecified foot: Secondary | ICD-10-CM

## 2014-05-17 DIAGNOSIS — M2010 Hallux valgus (acquired), unspecified foot: Secondary | ICD-10-CM

## 2014-05-17 MED ORDER — TRIAMCINOLONE ACETONIDE 10 MG/ML IJ SUSP
10.0000 mg | Freq: Once | INTRAMUSCULAR | Status: AC
  Administered 2014-05-17: 10 mg

## 2014-05-17 NOTE — Progress Notes (Signed)
Subjective:     Patient ID: Claire Wall, female   DOB: 01-27-1951, 63 y.o.   MRN: [id number]  Foot Pain   patient presents stating I have a lot of pain on her my left foot and and [place] and I cannot walk   Review of Systems  All other systems reviewed and are negative.      Objective:   Physical Exam  Nursing note and vitals reviewed. Constitutional: She is oriented to person, place, and time.  Cardiovascular: Intact distal pulses.   Musculoskeletal: Normal range of motion.  Neurological: She is oriented to person, place, and time.  Skin: Skin is warm.   neurovascular status intact with muscle strength adequate and range of motion of the subtalar and midtarsal joint within normal limits. Patient is found to have normal Fill time to the toes and is noted to have a mild diminishment of the arch height. Large hyperostosis medial aspect first metatarsal head is noted with pain and inflammation around the second MPJ of the left foot upon pressure     Assessment:     Capsulitis second MPJ was structural bunion and digital deformity as part of the problem    Plan:     H&P and x-rays reviewed. Today I did a proximal nerve block aspirated the second MPJ left I put a small amount of dexamethasone Kenalog and applied thick plantar pad to take pressure off the joint surface. Reappoint for consideration of orthotics in several weeks

## 2014-05-17 NOTE — Progress Notes (Signed)
   Subjective:    Patient ID: Claire Wall, female    DOB: 1951/08/11, 63 y.o.   MRN: [id number]  HPI Comments: i have pain in my left foot under the toes. i have had this pain since the middle of march 2015. It came up suddenly and its gotten better. It did hurt at night and was painful to walk. i went to [facility] family [facility] and i had some blood test for gout or some other conditions and x-rays and they showed arthritis and i was told to take advil for 2 weeks. Shoes hurt, hurts more at night and it drawls and cramping feeling.  Foot Pain      Review of Systems  Musculoskeletal:       Joint pain  All other systems reviewed and are negative.      Objective:   Physical Exam        Assessment & Plan:

## 2014-06-01 ENCOUNTER — Encounter: Payer: Self-pay | Admitting: Podiatry

## 2014-06-01 ENCOUNTER — Ambulatory Visit (INDEPENDENT_AMBULATORY_CARE_PROVIDER_SITE_OTHER): Payer: BC Managed Care – PPO | Admitting: Podiatry

## 2014-06-01 ENCOUNTER — Ambulatory Visit (INDEPENDENT_AMBULATORY_CARE_PROVIDER_SITE_OTHER): Payer: BC Managed Care – PPO

## 2014-06-01 VITALS — BP 133/69 | HR 66 | Resp 16 | Ht 69.5 in | Wt 172.0 lb

## 2014-06-01 DIAGNOSIS — M2010 Hallux valgus (acquired), unspecified foot: Secondary | ICD-10-CM

## 2014-06-01 DIAGNOSIS — R229 Localized swelling, mass and lump, unspecified: Secondary | ICD-10-CM

## 2014-06-01 DIAGNOSIS — IMO0002 Reserved for concepts with insufficient information to code with codable children: Secondary | ICD-10-CM

## 2014-06-01 NOTE — Progress Notes (Signed)
   Subjective:    Patient ID: Claire Wall, female    DOB: [birth year], 63 y.o.   MRN: [id number]  HPI Comments: Pt states she noticed this firm lump on the anterior foot on 05/25/2014, denies pain.     Review of Systems  All other systems reviewed and are negative.      Objective:   Physical Exam        Assessment & Plan:

## 2014-06-01 NOTE — Progress Notes (Signed)
Subjective:     Patient ID: Claire Wall, female   DOB: 31-May-1951, 63 y.o.   MRN: [id number]  HPI patient presents stating that she developed a lump on her right foot that she wanted to have tracked and her joint filled better on the left but her structural bunion deformity has been bothering her quite a bit more and she knows she's going to to have it fixed   Review of Systems     Objective:   Physical Exam Neurovascular status intact with range of motion adequate subtalar midtarsal joint and hyperostosis with redness first metatarsal head left with pain and I noted on the dorsum of the right foot there is irritation around the ankle with a small nodule which measures about 5 x 5 mm    Assessment:     Structural HAV deformity left present with pain and probable small nodule which may be a cyst formation dorsum right foot    Plan:     For the right foot we will use hot compresses and if it does not go away he may need to consider excision and we discussed bone correction of the left bunion deformity teary at patient wants to have it fixed at this time I allowed her to read a consent form for correction going over all possible complications that can occur with surgery such as this and the fact that total recovery. We'll take 6 months to one year. Patient wants surgery signed consent form after extensive reviewed and is scheduled for outpatient surgery and today I also dispensed air fracture walker with instructions on usage

## 2014-06-01 NOTE — Patient Instructions (Signed)
Pre-Operative Instructions  Congratulations, you have decided to take an important step to improving your quality of life.  You can be assured that the doctors of [facility] will be with you every step of the way.  1. Plan to be at the surgery center/hospital at least 1 (one) hour prior to your scheduled time unless otherwise directed by the surgical center/hospital staff.  You must have a responsible adult accompany you, remain during the surgery and drive you home.  Make sure you have directions to the surgical center/hospital and know how to get there on time. 2. For hospital based surgery you will need to obtain a history and physical form from your family physician within 1 month prior to the date of surgery- we will give you a form for you primary physician.  3. We make every effort to accommodate the date you request for surgery.  There are however, times where surgery dates or times have to be moved.  We will contact you as soon as possible if a change in schedule is required.   4. No Aspirin/Ibuprofen for one week before surgery.  If you are on aspirin, any non-steroidal anti-inflammatory medications (Mobic, Aleve, Ibuprofen) you should stop taking it 7 days prior to your surgery.  You make take Tylenol  For pain prior to surgery.  5. Medications- If you are taking daily heart and blood pressure medications, seizure, reflux, allergy, asthma, anxiety, pain or diabetes medications, make sure the surgery center/hospital is aware before the day of surgery so they may notify you which medications to take or avoid the day of surgery. 6. No food or drink after midnight the night before surgery unless directed otherwise by surgical center/hospital staff. 7. No alcoholic beverages 24 hours prior to surgery.  No smoking 24 hours prior to or 24 hours after surgery. 8. Wear loose pants or shorts- loose enough to fit over bandages, boots, and casts. 9. No slip on shoes, sneakers are best. 10. Bring  your boot with you to the surgery center/hospital.  Also bring crutches or a walker if your physician has prescribed it for you.  If you do not have this equipment, it will be provided for you after surgery. 11. If you have not been contracted by the surgery center/hospital by the day before your surgery, call to confirm the date and time of your surgery. 12. Leave-time from work may vary depending on the type of surgery you have.  Appropriate arrangements should be made prior to surgery with your employer. 13. Prescriptions will be provided immediately following surgery by your doctor.  Have these filled as soon as possible after surgery and take the medication as directed. 14. Remove nail polish on the operative foot. 15. Wash the night before surgery.  The night before surgery wash the foot and leg well with the antibacterial soap provided and water paying special attention to beneath the toenails and in between the toes.  Rinse thoroughly with water and dry well with a towel.  Perform this wash unless told not to do so by your physician.  Enclosed: 1 Ice pack (please put in freezer the night before surgery)   1 Hibiclens skin cleaner   Pre-op Instructions  If you have any questions regarding the instructions, do not hesitate to call our office.  [place]: [address] , [place] [address] [phone]  [place]: [address] [place], [place] [address] [phone]  [place]: [address]  [place],  [address] [phone]  Dr. Richard   Tuchman DPM, Dr. Norman Regal DPM Dr. Richard Sikora DPM, Dr. M. Todd Hyatt DPM, Dr. Kathryn Egerton DPM 

## 2014-07-04 ENCOUNTER — Telehealth: Payer: Self-pay | Admitting: *Deleted

## 2014-07-04 NOTE — Telephone Encounter (Signed)
Calling for 2 reasons.  First reason, I saw him in June.  I have a cyst on top of my foot.  He told me to put heat on it daily.  I been doing so and I see no change in the cyst.  It is painful when I wear a shoes.  Should I schedule an appointment for him to take a look at it or whatever he wants to do.  He mentioned something about if it hasn't gone before my foot surgery on the 28th he may have to do something.  My second reason is I'm scheduled for foot surgery on the 28th.  I don't know the time.  I've completed all the forms on-line.  Give me a call.  I called and left her a message to call and schedule an appointment with Dr. Paulla Dolly in regards to the cyst.  As far as surgery is concerned the surgical center normally calls a day or two before and will let you know exactly what time to get there.  Call me if you have any further questions.

## 2014-07-10 ENCOUNTER — Encounter: Payer: Self-pay | Admitting: Podiatry

## 2014-07-10 ENCOUNTER — Ambulatory Visit (INDEPENDENT_AMBULATORY_CARE_PROVIDER_SITE_OTHER): Payer: BC Managed Care – PPO | Admitting: Podiatry

## 2014-07-10 VITALS — BP 123/68 | HR 65 | Resp 16

## 2014-07-10 DIAGNOSIS — D492 Neoplasm of unspecified behavior of bone, soft tissue, and skin: Secondary | ICD-10-CM

## 2014-07-10 DIAGNOSIS — M6740 Ganglion, unspecified site: Secondary | ICD-10-CM

## 2014-07-10 NOTE — Patient Instructions (Signed)
Pre-Operative Instructions  Congratulations, you have decided to take an important step to improving your quality of life.  You can be assured that the doctors of [facility] will be with you every step of the way.  1. Plan to be at the surgery center/hospital at least 1 (one) hour prior to your scheduled time unless otherwise directed by the surgical center/hospital staff.  You must have a responsible adult accompany you, remain during the surgery and drive you home.  Make sure you have directions to the surgical center/hospital and know how to get there on time. 2. For hospital based surgery you will need to obtain a history and physical form from your family physician within 1 month prior to the date of surgery- we will give you a form for you primary physician.  3. We make every effort to accommodate the date you request for surgery.  There are however, times where surgery dates or times have to be moved.  We will contact you as soon as possible if a change in schedule is required.   4. No Aspirin/Ibuprofen for one week before surgery.  If you are on aspirin, any non-steroidal anti-inflammatory medications (Mobic, Aleve, Ibuprofen) you should stop taking it 7 days prior to your surgery.  You make take Tylenol  For pain prior to surgery.  5. Medications- If you are taking daily heart and blood pressure medications, seizure, reflux, allergy, asthma, anxiety, pain or diabetes medications, make sure the surgery center/hospital is aware before the day of surgery so they may notify you which medications to take or avoid the day of surgery. 6. No food or drink after midnight the night before surgery unless directed otherwise by surgical center/hospital staff. 7. No alcoholic beverages 24 hours prior to surgery.  No smoking 24 hours prior to or 24 hours after surgery. 8. Wear loose pants or shorts- loose enough to fit over bandages, boots, and casts. 9. No slip on shoes, sneakers are best. 10. Bring  your boot with you to the surgery center/hospital.  Also bring crutches or a walker if your physician has prescribed it for you.  If you do not have this equipment, it will be provided for you after surgery. 11. If you have not been contracted by the surgery center/hospital by the day before your surgery, call to confirm the date and time of your surgery. 12. Leave-time from work may vary depending on the type of surgery you have.  Appropriate arrangements should be made prior to surgery with your employer. 13. Prescriptions will be provided immediately following surgery by your doctor.  Have these filled as soon as possible after surgery and take the medication as directed. 14. Remove nail polish on the operative foot. 15. Wash the night before surgery.  The night before surgery wash the foot and leg well with the antibacterial soap provided and water paying special attention to beneath the toenails and in between the toes.  Rinse thoroughly with water and dry well with a towel.  Perform this wash unless told not to do so by your physician.  Enclosed: 1 Ice pack (please put in freezer the night before surgery)   1 Hibiclens skin cleaner   Pre-op Instructions  If you have any questions regarding the instructions, do not hesitate to call our office.  [place]: [address] [place], [place] [address] [phone]  [place]: [address] [place], [place] [address] [phone]  [place]: [address]  [place], [place] [address] [phone]  Dr. Richard   Tuchman DPM, Dr. Norman Regal DPM Dr. Richard Sikora DPM, Dr. M. Todd Hyatt DPM, Dr. Kathryn Egerton DPM 

## 2014-07-12 NOTE — Progress Notes (Signed)
Subjective:     Patient ID: Claire Wall, female   DOB: [birth year], 63 y.o.   MRN: [id number]  HPI patient presents with mass on the dorsum of the right ankle that has been starting to bother her more and is scheduled for structural bunion correction in the next week   Review of Systems     Objective:   Physical Exam Neurovascular status intact with no other health history changes with structural bunion deformity left it's painful and red and is due to be fixed any lesion on the right medial ankle around the anterior tibial tendon it's painful when pressed and appears to be within subcutaneous tissue    Assessment:     Possible ganglionic versus other type of mass dorsal right and HAV deformity left    Plan:     Proximal nerve block accomplished right and I was able to get out a small amount of fluid and then went ahead and I applied compression and a small amount steroid to shrink. If it does not drink and still bothers her we will consider excision when I see her next week for surgery

## 2014-07-18 DIAGNOSIS — M2010 Hallux valgus (acquired), unspecified foot: Secondary | ICD-10-CM

## 2014-07-18 DIAGNOSIS — M6740 Ganglion, unspecified site: Secondary | ICD-10-CM

## 2014-07-21 ENCOUNTER — Telehealth: Payer: Self-pay

## 2014-07-21 NOTE — Telephone Encounter (Signed)
Spoke with pt regarding post operative status. She states that she is doing well, and her pain has improved, she had no questions or concerns, advised to ice and elevated, remain in boot and keep sterile dressing dry

## 2014-07-24 ENCOUNTER — Encounter: Payer: Self-pay | Admitting: Podiatry

## 2014-07-24 ENCOUNTER — Ambulatory Visit (INDEPENDENT_AMBULATORY_CARE_PROVIDER_SITE_OTHER): Payer: BC Managed Care – PPO | Admitting: Podiatry

## 2014-07-24 ENCOUNTER — Ambulatory Visit (INDEPENDENT_AMBULATORY_CARE_PROVIDER_SITE_OTHER): Payer: BC Managed Care – PPO

## 2014-07-24 VITALS — BP 138/76 | HR 78 | Resp 12

## 2014-07-24 DIAGNOSIS — M2012 Hallux valgus (acquired), left foot: Secondary | ICD-10-CM

## 2014-07-24 DIAGNOSIS — M2010 Hallux valgus (acquired), unspecified foot: Secondary | ICD-10-CM

## 2014-07-24 NOTE — Patient Instructions (Signed)
In a seated position okay to get wound site shower wet  Continue wearing boot on left leg foot

## 2014-07-25 NOTE — Progress Notes (Signed)
Patient ID: Claire Wall, female   DOB: 05-Apr-1951, 63 y.o.   MRN: [id number]  Subjective: Patient presents postop left Liane Comber bunionectomy and excision of soft tissue mass right ankle on 07/13/2014. Patient having minimal discomfort at this time. She was complaining of irritation from the surgical shoe on the right ankle area and discontinue wearing surgical shoe on right and is wearing a crock-like shoe on the right. Also, patient stopped wearing the Cam Walker boot on the left and is wearing a surgical shoe on the left.  Objective: Orientated x3 white female No edema or calf tenderness bilaterally The surgical incision right dorsal foot ankle area approximated with minimal edema, no drainage Surgical incision on left approximated with minimal edema, some ecchymosis noted in the forearm her foot area. Satisfactory alignment of left hallux  X-ray examination left foot  [place] bunionectomy postop with retained internal cross K wire fixation in satisfactory alignment  Radiographic impression:  Satisfactory alignment postop left [place] bunionectomy  Assessment: No evidence of DVT bilaterally No clinical sign of infection bilaterally Noncompliance as she stop wearing surgical shoe on the right and Cam Walker boot on the left Satisfactory appearance of surgical sites bilaterally  Plan: Reapply light dry sterile compression dressing bilaterally Patient advised to wear Cam Walker boot on the left at all times when walking Okay to wear soft shoe on theright as the surgical shoe is irritating the surgical wound   Patient allowed to get both surgical sites the shower wet in a seated position only remove boot several times a day and dorsi and plantar flex left foot ankle when seated  Report x2 weeks for followup with Dr. Paulla Dolly or sooner if patient has concern

## 2014-08-07 ENCOUNTER — Ambulatory Visit (INDEPENDENT_AMBULATORY_CARE_PROVIDER_SITE_OTHER): Payer: BC Managed Care – PPO

## 2014-08-07 ENCOUNTER — Ambulatory Visit (INDEPENDENT_AMBULATORY_CARE_PROVIDER_SITE_OTHER): Payer: BC Managed Care – PPO | Admitting: Podiatry

## 2014-08-07 ENCOUNTER — Encounter: Payer: BC Managed Care – PPO | Admitting: Podiatry

## 2014-08-07 VITALS — BP 122/78 | HR 75 | Resp 17

## 2014-08-07 DIAGNOSIS — Z9889 Other specified postprocedural states: Secondary | ICD-10-CM

## 2014-08-07 NOTE — Progress Notes (Signed)
   Subjective:    Patient ID: Claire Wall, female    DOB: 06/28/1951, 63 y.o.   MRN: [id number]  HPI Pt presents as routine post op left [facility] 07/18/14, xrays done, c/o minor pain, noted both surgical wound aligned and approximated, some erythema and mild swelling on right and left foot. Right foot had cyst removed.   Review of Systems     Objective:   Physical Exam        Assessment & Plan:

## 2014-08-07 NOTE — Progress Notes (Signed)
Subjective:     Patient ID: Claire Wall, female   DOB: 16-Nov-1951, 63 y.o.   MRN: [id number]  HPI patient presents stating my left foot is doing well my right foot is doing well with minimal swelling edema and able to walk distances   Review of Systems     Objective:   Physical Exam Neurovascular status intact negative Homans sign noted with well-healing surgical site first metatarsal left and, right foot    Assessment:     Well-healing HAV site left and well-healing ganglionic cyst site right    Plan:     H&P reviewed x-ray reviewed and allow patient to return to normal activity. Patient will be seen back for [place] to recheck again in 6 weeks earlier if necessary

## 2014-08-24 ENCOUNTER — Encounter: Payer: Self-pay | Admitting: Podiatry

## 2014-09-18 ENCOUNTER — Encounter: Payer: BC Managed Care – PPO | Admitting: Podiatry

## 2014-09-20 ENCOUNTER — Ambulatory Visit (INDEPENDENT_AMBULATORY_CARE_PROVIDER_SITE_OTHER): Payer: BC Managed Care – PPO

## 2014-09-20 ENCOUNTER — Ambulatory Visit (INDEPENDENT_AMBULATORY_CARE_PROVIDER_SITE_OTHER): Payer: BC Managed Care – PPO | Admitting: Podiatry

## 2014-09-20 VITALS — BP 128/69 | HR 68 | Resp 16

## 2014-09-20 DIAGNOSIS — M21619 Bunion of unspecified foot: Secondary | ICD-10-CM

## 2014-09-20 DIAGNOSIS — M21612 Bunion of left foot: Secondary | ICD-10-CM

## 2014-09-20 NOTE — Progress Notes (Signed)
Subjective:     Patient ID: Claire Wall, female   DOB: [birth year], 63 y.o.   MRN: [id number]  HPI I just wanted to make sure my left foot was doing okay.    Review of Systems     Objective:   Physical Exam Neurovascular status intact negative Homans sign was noted and a very light discoloration occurring in the forefoot left that is not specific to one area with no drainage the incision site wound edges well coapted and good alignment of the hallux second toe    Assessment:     Appears to be more of a bruising pattern left foot versus any other issue    Plan:     Instructed if any drainage should occur any increase in redness swelling or pain to let us know immediately if not keep regular appointment in several weeks and also reviewed x-rays

## 2014-10-30 ENCOUNTER — Ambulatory Visit (INDEPENDENT_AMBULATORY_CARE_PROVIDER_SITE_OTHER): Payer: BC Managed Care – PPO | Admitting: Podiatry

## 2014-10-30 ENCOUNTER — Ambulatory Visit (INDEPENDENT_AMBULATORY_CARE_PROVIDER_SITE_OTHER): Payer: BC Managed Care – PPO

## 2014-10-30 VITALS — BP 143/74 | HR 77 | Resp 16

## 2014-10-30 DIAGNOSIS — Z9889 Other specified postprocedural states: Secondary | ICD-10-CM

## 2014-10-30 NOTE — Progress Notes (Signed)
Subjective:     Patient ID: Claire Wall, female   DOB: 26-Nov-1951, 63 y.o.   MRN: [id number]  HPIpatient states I'm doing very well with my foot and walking without discomfort   Review of Systems     Objective:   Physical Exam Neurovascular status intact with well healing surgical site left with good range of motion first MPJ and no crepitus    Assessment:     Doing well postoperatively left foot    Plan:     Reviewed x-ray and allow patient to return to normal activities at this time

## 2014-12-20 ENCOUNTER — Other Ambulatory Visit: Payer: Self-pay

## 2014-12-20 DIAGNOSIS — Z1231 Encounter for screening mammogram for malignant neoplasm of breast: Secondary | ICD-10-CM

## 2014-12-28 ENCOUNTER — Ambulatory Visit
Admission: RE | Admit: 2014-12-28 | Discharge: 2014-12-28 | Disposition: A | Discharge status: Home / Self Care | Payer: 59 | Source: Ambulatory Visit

## 2014-12-28 DIAGNOSIS — Z1231 Encounter for screening mammogram for malignant neoplasm of breast: Secondary | ICD-10-CM

## 2015-12-25 ENCOUNTER — Ambulatory Visit (INDEPENDENT_AMBULATORY_CARE_PROVIDER_SITE_OTHER): Payer: Managed Care, Other (non HMO) | Admitting: Podiatry

## 2015-12-25 ENCOUNTER — Ambulatory Visit (INDEPENDENT_AMBULATORY_CARE_PROVIDER_SITE_OTHER): Payer: Managed Care, Other (non HMO)

## 2015-12-25 ENCOUNTER — Encounter: Payer: Self-pay | Admitting: Podiatry

## 2015-12-25 VITALS — BP 125/68 | HR 81 | Resp 16

## 2015-12-25 DIAGNOSIS — M79672 Pain in left foot: Secondary | ICD-10-CM

## 2015-12-25 DIAGNOSIS — T148XXA Other injury of unspecified body region, initial encounter: Secondary | ICD-10-CM

## 2015-12-25 DIAGNOSIS — T148 Other injury of unspecified body region: Secondary | ICD-10-CM | POA: Diagnosis not present

## 2016-01-11 ENCOUNTER — Encounter: Payer: Self-pay | Admitting: Podiatry

## 2016-01-11 ENCOUNTER — Ambulatory Visit (INDEPENDENT_AMBULATORY_CARE_PROVIDER_SITE_OTHER): Payer: Managed Care, Other (non HMO) | Admitting: Podiatry

## 2016-01-11 VITALS — BP 119/77 | HR 74 | Resp 12

## 2016-01-11 DIAGNOSIS — T148 Other injury of unspecified body region: Secondary | ICD-10-CM | POA: Diagnosis not present

## 2016-01-11 DIAGNOSIS — M722 Plantar fascial fibromatosis: Secondary | ICD-10-CM | POA: Diagnosis not present

## 2016-01-11 DIAGNOSIS — T148XXA Other injury of unspecified body region, initial encounter: Secondary | ICD-10-CM

## 2016-01-11 MED ORDER — TRIAMCINOLONE ACETONIDE 10 MG/ML IJ SUSP
10.0000 mg | Freq: Once | INTRAMUSCULAR | Status: AC
  Administered 2016-01-11: 10 mg

## 2016-01-11 NOTE — Progress Notes (Signed)
Subjective:     Patient ID: Claire Wall, female   DOB: 1951/04/17, 65 y.o.   MRN: [id number]  HPI patient states my heel seems to be still quite sore and the boot has helped some in the arch but I'm getting a lot of discomfort in the heel   Review of Systems     Objective:   Physical Exam  Neurovascular status intact no other health history changes and negative Homans sign noted with what appears to be an intact plantar tendon with possible disruption of several fibers but for the most part intact with inflammation and pain around the medial band and into the calcaneus    Assessment:     Probable plantar fasciitis left acute in nature with possible tear of a small group fibers of the plantar fascia    Plan:     Explained treatment and condition and at this time did careful medial injection at the insertion 3 mg Kenalog 5 mg Xylocaine and dispensed fascial brace with instructions. Reappoint 3 weeks or earlier and patient will have reduced activity for that period of time

## 2016-01-14 NOTE — Progress Notes (Signed)
Subjective:     Patient ID: Claire Wall, female   DOB: February 23, 1951, 64 y.o.   MRN: [id number]  HPI patient states that I heard a popping noise in my left heel and arch and I was concerned I may have torn something   Review of Systems     Objective:   Physical Exam Neurovascular status intact no changes in health history with discomfort in the plantar arch left and plantar heel with possible disruption of medial fibers upon palpation    Assessment:     Possibility for modest tear of the plantar fascial left versus possibility for acute plantar fasciitis    Plan:     Explain both conditions and at this time I went ahead and I applied an air fracture walker to completely immobilize. Reappoint to recheck

## 2016-01-23 ENCOUNTER — Encounter: Payer: Self-pay | Admitting: Podiatry

## 2016-01-23 ENCOUNTER — Ambulatory Visit (INDEPENDENT_AMBULATORY_CARE_PROVIDER_SITE_OTHER): Payer: Managed Care, Other (non HMO) | Admitting: Podiatry

## 2016-01-23 VITALS — BP 141/80 | HR 66 | Resp 16

## 2016-01-23 DIAGNOSIS — M722 Plantar fascial fibromatosis: Secondary | ICD-10-CM | POA: Diagnosis not present

## 2016-01-23 DIAGNOSIS — T148 Other injury of unspecified body region: Secondary | ICD-10-CM | POA: Diagnosis not present

## 2016-01-23 DIAGNOSIS — T148XXA Other injury of unspecified body region, initial encounter: Secondary | ICD-10-CM

## 2016-01-23 NOTE — Progress Notes (Signed)
Subjective:     Patient ID: Claire Wall, female   DOB: 10-14-1951, 65 y.o.   MRN: [id number]  HPI patient states I'm doing better but I admit I have not been wearing my boot   Review of Systems     Objective:   Physical Exam  her vascular status intact with possible disruption of some fibers left plantar fascia with mild to moderate discomfort distal to the insertion to the calcaneus    Assessment:      possibility for tear of the plantar fascial left    Plan:      went ahead today and I advised on wearing the boot for the next several weeks with ice therapy and reappoint to recheck as needed

## 2016-01-29 ENCOUNTER — Other Ambulatory Visit: Payer: Self-pay

## 2016-01-29 DIAGNOSIS — Z1231 Encounter for screening mammogram for malignant neoplasm of breast: Secondary | ICD-10-CM

## 2016-02-19 ENCOUNTER — Ambulatory Visit: Payer: 59

## 2016-02-19 ENCOUNTER — Ambulatory Visit
Admission: RE | Admit: 2016-02-19 | Discharge: 2016-02-19 | Disposition: A | Discharge status: Home / Self Care | Payer: 59 | Source: Ambulatory Visit

## 2016-02-19 DIAGNOSIS — Z1231 Encounter for screening mammogram for malignant neoplasm of breast: Secondary | ICD-10-CM

## 2016-03-24 DIAGNOSIS — R7301 Impaired fasting glucose: Secondary | ICD-10-CM | POA: Diagnosis not present

## 2016-03-24 DIAGNOSIS — J329 Chronic sinusitis, unspecified: Secondary | ICD-10-CM | POA: Diagnosis not present

## 2016-03-24 DIAGNOSIS — Z0000 Encounter for general adult medical examination without abnormal findings: Secondary | ICD-10-CM | POA: Diagnosis not present

## 2016-03-24 DIAGNOSIS — I10 Essential (primary) hypertension: Secondary | ICD-10-CM | POA: Diagnosis not present

## 2016-03-24 DIAGNOSIS — Z86010 Personal history of colonic polyps: Secondary | ICD-10-CM | POA: Diagnosis not present

## 2016-03-24 DIAGNOSIS — E785 Hyperlipidemia, unspecified: Secondary | ICD-10-CM | POA: Diagnosis not present

## 2016-03-24 DIAGNOSIS — R899 Unspecified abnormal finding in specimens from other organs, systems and tissues: Secondary | ICD-10-CM | POA: Diagnosis not present

## 2016-06-05 DIAGNOSIS — L438 Other lichen planus: Secondary | ICD-10-CM | POA: Diagnosis not present

## 2016-06-05 DIAGNOSIS — L570 Actinic keratosis: Secondary | ICD-10-CM | POA: Diagnosis not present

## 2016-06-05 DIAGNOSIS — L812 Freckles: Secondary | ICD-10-CM | POA: Diagnosis not present

## 2016-06-05 DIAGNOSIS — D1801 Hemangioma of skin and subcutaneous tissue: Secondary | ICD-10-CM | POA: Diagnosis not present

## 2016-06-05 DIAGNOSIS — D485 Neoplasm of uncertain behavior of skin: Secondary | ICD-10-CM | POA: Diagnosis not present

## 2016-06-05 DIAGNOSIS — C44629 Squamous cell carcinoma of skin of left upper limb, including shoulder: Secondary | ICD-10-CM | POA: Diagnosis not present

## 2016-06-05 DIAGNOSIS — Z85828 Personal history of other malignant neoplasm of skin: Secondary | ICD-10-CM | POA: Diagnosis not present

## 2016-06-05 DIAGNOSIS — C44729 Squamous cell carcinoma of skin of left lower limb, including hip: Secondary | ICD-10-CM | POA: Diagnosis not present

## 2016-06-05 DIAGNOSIS — L821 Other seborrheic keratosis: Secondary | ICD-10-CM | POA: Diagnosis not present

## 2016-07-04 DIAGNOSIS — M5412 Radiculopathy, cervical region: Secondary | ICD-10-CM | POA: Diagnosis not present

## 2016-07-04 DIAGNOSIS — M9901 Segmental and somatic dysfunction of cervical region: Secondary | ICD-10-CM | POA: Diagnosis not present

## 2016-07-15 DIAGNOSIS — M9901 Segmental and somatic dysfunction of cervical region: Secondary | ICD-10-CM | POA: Diagnosis not present

## 2016-07-15 DIAGNOSIS — M5412 Radiculopathy, cervical region: Secondary | ICD-10-CM | POA: Diagnosis not present

## 2016-07-22 DIAGNOSIS — M9901 Segmental and somatic dysfunction of cervical region: Secondary | ICD-10-CM | POA: Diagnosis not present

## 2016-07-22 DIAGNOSIS — M5412 Radiculopathy, cervical region: Secondary | ICD-10-CM | POA: Diagnosis not present

## 2016-08-14 DIAGNOSIS — R7301 Impaired fasting glucose: Secondary | ICD-10-CM | POA: Diagnosis not present

## 2016-12-12 DIAGNOSIS — Z85828 Personal history of other malignant neoplasm of skin: Secondary | ICD-10-CM | POA: Diagnosis not present

## 2016-12-12 DIAGNOSIS — L820 Inflamed seborrheic keratosis: Secondary | ICD-10-CM | POA: Diagnosis not present

## 2016-12-12 DIAGNOSIS — D1801 Hemangioma of skin and subcutaneous tissue: Secondary | ICD-10-CM | POA: Diagnosis not present

## 2016-12-12 DIAGNOSIS — L812 Freckles: Secondary | ICD-10-CM | POA: Diagnosis not present

## 2016-12-12 DIAGNOSIS — L853 Xerosis cutis: Secondary | ICD-10-CM | POA: Diagnosis not present

## 2016-12-12 DIAGNOSIS — L821 Other seborrheic keratosis: Secondary | ICD-10-CM | POA: Diagnosis not present

## 2016-12-12 DIAGNOSIS — L570 Actinic keratosis: Secondary | ICD-10-CM | POA: Diagnosis not present

## 2016-12-12 DIAGNOSIS — L309 Dermatitis, unspecified: Secondary | ICD-10-CM | POA: Diagnosis not present

## 2016-12-22 HISTORY — PX: OTHER SURGICAL HISTORY: SHX169

## 2017-02-13 ENCOUNTER — Other Ambulatory Visit: Payer: Self-pay | Admitting: Family Medicine

## 2017-02-13 DIAGNOSIS — Z1231 Encounter for screening mammogram for malignant neoplasm of breast: Secondary | ICD-10-CM

## 2017-02-23 DIAGNOSIS — I10 Essential (primary) hypertension: Secondary | ICD-10-CM | POA: Diagnosis not present

## 2017-02-26 DIAGNOSIS — Z23 Encounter for immunization: Secondary | ICD-10-CM | POA: Diagnosis not present

## 2017-02-26 DIAGNOSIS — Z86010 Personal history of colonic polyps: Secondary | ICD-10-CM | POA: Diagnosis not present

## 2017-02-26 DIAGNOSIS — R7301 Impaired fasting glucose: Secondary | ICD-10-CM | POA: Diagnosis not present

## 2017-02-26 DIAGNOSIS — E785 Hyperlipidemia, unspecified: Secondary | ICD-10-CM | POA: Diagnosis not present

## 2017-02-26 DIAGNOSIS — Z0000 Encounter for general adult medical examination without abnormal findings: Secondary | ICD-10-CM | POA: Diagnosis not present

## 2017-02-26 DIAGNOSIS — I10 Essential (primary) hypertension: Secondary | ICD-10-CM | POA: Diagnosis not present

## 2017-03-10 ENCOUNTER — Ambulatory Visit
Admission: RE | Admit: 2017-03-10 | Discharge: 2017-03-10 | Disposition: A | Discharge status: Home / Self Care | Payer: PPO | Source: Ambulatory Visit | Attending: Family Medicine | Admitting: Family Medicine

## 2017-03-10 DIAGNOSIS — Z1231 Encounter for screening mammogram for malignant neoplasm of breast: Secondary | ICD-10-CM

## 2017-03-25 ENCOUNTER — Other Ambulatory Visit: Payer: Self-pay | Admitting: Obstetrics & Gynecology

## 2017-03-25 DIAGNOSIS — Z01419 Encounter for gynecological examination (general) (routine) without abnormal findings: Secondary | ICD-10-CM | POA: Diagnosis not present

## 2017-03-25 DIAGNOSIS — E785 Hyperlipidemia, unspecified: Secondary | ICD-10-CM | POA: Insufficient documentation

## 2017-03-25 DIAGNOSIS — Z124 Encounter for screening for malignant neoplasm of cervix: Secondary | ICD-10-CM | POA: Diagnosis not present

## 2017-03-25 DIAGNOSIS — Z6824 Body mass index (BMI) 24.0-24.9, adult: Secondary | ICD-10-CM | POA: Diagnosis not present

## 2017-03-29 LAB — CYTOLOGY - PAP

## 2017-06-10 DIAGNOSIS — N393 Stress incontinence (female) (male): Secondary | ICD-10-CM | POA: Diagnosis not present

## 2017-06-10 DIAGNOSIS — N952 Postmenopausal atrophic vaginitis: Secondary | ICD-10-CM | POA: Diagnosis not present

## 2017-06-12 DIAGNOSIS — L812 Freckles: Secondary | ICD-10-CM | POA: Diagnosis not present

## 2017-06-12 DIAGNOSIS — D1801 Hemangioma of skin and subcutaneous tissue: Secondary | ICD-10-CM | POA: Diagnosis not present

## 2017-06-12 DIAGNOSIS — M791 Myalgia: Secondary | ICD-10-CM | POA: Diagnosis not present

## 2017-06-12 DIAGNOSIS — Z85828 Personal history of other malignant neoplasm of skin: Secondary | ICD-10-CM | POA: Diagnosis not present

## 2017-06-12 DIAGNOSIS — M9905 Segmental and somatic dysfunction of pelvic region: Secondary | ICD-10-CM | POA: Diagnosis not present

## 2017-06-12 DIAGNOSIS — L821 Other seborrheic keratosis: Secondary | ICD-10-CM | POA: Diagnosis not present

## 2017-06-12 DIAGNOSIS — L720 Epidermal cyst: Secondary | ICD-10-CM | POA: Diagnosis not present

## 2017-06-12 DIAGNOSIS — D2272 Melanocytic nevi of left lower limb, including hip: Secondary | ICD-10-CM | POA: Diagnosis not present

## 2017-06-12 DIAGNOSIS — D2271 Melanocytic nevi of right lower limb, including hip: Secondary | ICD-10-CM | POA: Diagnosis not present

## 2017-06-12 DIAGNOSIS — L918 Other hypertrophic disorders of the skin: Secondary | ICD-10-CM | POA: Diagnosis not present

## 2017-06-12 DIAGNOSIS — M9902 Segmental and somatic dysfunction of thoracic region: Secondary | ICD-10-CM | POA: Diagnosis not present

## 2017-06-12 DIAGNOSIS — M9903 Segmental and somatic dysfunction of lumbar region: Secondary | ICD-10-CM | POA: Diagnosis not present

## 2017-06-18 DIAGNOSIS — M9902 Segmental and somatic dysfunction of thoracic region: Secondary | ICD-10-CM | POA: Diagnosis not present

## 2017-06-18 DIAGNOSIS — M9905 Segmental and somatic dysfunction of pelvic region: Secondary | ICD-10-CM | POA: Diagnosis not present

## 2017-06-18 DIAGNOSIS — M791 Myalgia: Secondary | ICD-10-CM | POA: Diagnosis not present

## 2017-06-18 DIAGNOSIS — M9903 Segmental and somatic dysfunction of lumbar region: Secondary | ICD-10-CM | POA: Diagnosis not present

## 2017-07-02 DIAGNOSIS — M9903 Segmental and somatic dysfunction of lumbar region: Secondary | ICD-10-CM | POA: Diagnosis not present

## 2017-07-02 DIAGNOSIS — M9905 Segmental and somatic dysfunction of pelvic region: Secondary | ICD-10-CM | POA: Diagnosis not present

## 2017-07-02 DIAGNOSIS — M9902 Segmental and somatic dysfunction of thoracic region: Secondary | ICD-10-CM | POA: Diagnosis not present

## 2017-07-02 DIAGNOSIS — M791 Myalgia: Secondary | ICD-10-CM | POA: Diagnosis not present

## 2017-07-21 DIAGNOSIS — M9905 Segmental and somatic dysfunction of pelvic region: Secondary | ICD-10-CM | POA: Diagnosis not present

## 2017-07-21 DIAGNOSIS — M9902 Segmental and somatic dysfunction of thoracic region: Secondary | ICD-10-CM | POA: Diagnosis not present

## 2017-07-21 DIAGNOSIS — M791 Myalgia: Secondary | ICD-10-CM | POA: Diagnosis not present

## 2017-07-21 DIAGNOSIS — M9903 Segmental and somatic dysfunction of lumbar region: Secondary | ICD-10-CM | POA: Diagnosis not present

## 2017-08-04 DIAGNOSIS — M791 Myalgia: Secondary | ICD-10-CM | POA: Diagnosis not present

## 2017-08-04 DIAGNOSIS — M9902 Segmental and somatic dysfunction of thoracic region: Secondary | ICD-10-CM | POA: Diagnosis not present

## 2017-08-04 DIAGNOSIS — M9905 Segmental and somatic dysfunction of pelvic region: Secondary | ICD-10-CM | POA: Diagnosis not present

## 2017-08-04 DIAGNOSIS — M9903 Segmental and somatic dysfunction of lumbar region: Secondary | ICD-10-CM | POA: Diagnosis not present

## 2017-08-25 DIAGNOSIS — M791 Myalgia: Secondary | ICD-10-CM | POA: Diagnosis not present

## 2017-08-25 DIAGNOSIS — M9902 Segmental and somatic dysfunction of thoracic region: Secondary | ICD-10-CM | POA: Diagnosis not present

## 2017-08-25 DIAGNOSIS — M9903 Segmental and somatic dysfunction of lumbar region: Secondary | ICD-10-CM | POA: Diagnosis not present

## 2017-08-25 DIAGNOSIS — M9905 Segmental and somatic dysfunction of pelvic region: Secondary | ICD-10-CM | POA: Diagnosis not present

## 2017-09-08 DIAGNOSIS — C44729 Squamous cell carcinoma of skin of left lower limb, including hip: Secondary | ICD-10-CM | POA: Diagnosis not present

## 2017-09-08 DIAGNOSIS — D485 Neoplasm of uncertain behavior of skin: Secondary | ICD-10-CM | POA: Diagnosis not present

## 2017-09-08 DIAGNOSIS — Z85828 Personal history of other malignant neoplasm of skin: Secondary | ICD-10-CM | POA: Diagnosis not present

## 2017-10-01 DIAGNOSIS — M9905 Segmental and somatic dysfunction of pelvic region: Secondary | ICD-10-CM | POA: Diagnosis not present

## 2017-10-01 DIAGNOSIS — M791 Myalgia, unspecified site: Secondary | ICD-10-CM | POA: Diagnosis not present

## 2017-10-01 DIAGNOSIS — M9902 Segmental and somatic dysfunction of thoracic region: Secondary | ICD-10-CM | POA: Diagnosis not present

## 2017-10-01 DIAGNOSIS — M9903 Segmental and somatic dysfunction of lumbar region: Secondary | ICD-10-CM | POA: Diagnosis not present

## 2017-10-08 DIAGNOSIS — N393 Stress incontinence (female) (male): Secondary | ICD-10-CM | POA: Diagnosis not present

## 2017-10-08 DIAGNOSIS — I10 Essential (primary) hypertension: Secondary | ICD-10-CM | POA: Diagnosis not present

## 2017-10-08 DIAGNOSIS — Z79899 Other long term (current) drug therapy: Secondary | ICD-10-CM | POA: Diagnosis not present

## 2017-10-08 DIAGNOSIS — E785 Hyperlipidemia, unspecified: Secondary | ICD-10-CM | POA: Diagnosis not present

## 2017-10-08 DIAGNOSIS — Z7982 Long term (current) use of aspirin: Secondary | ICD-10-CM | POA: Diagnosis not present

## 2017-10-08 DIAGNOSIS — Z888 Allergy status to other drugs, medicaments and biological substances status: Secondary | ICD-10-CM | POA: Diagnosis not present

## 2017-10-08 DIAGNOSIS — E7800 Pure hypercholesterolemia, unspecified: Secondary | ICD-10-CM | POA: Diagnosis not present

## 2017-10-08 DIAGNOSIS — Z885 Allergy status to narcotic agent status: Secondary | ICD-10-CM | POA: Diagnosis not present

## 2017-10-08 DIAGNOSIS — Z85828 Personal history of other malignant neoplasm of skin: Secondary | ICD-10-CM | POA: Diagnosis not present

## 2017-10-08 DIAGNOSIS — N342 Other urethritis: Secondary | ICD-10-CM | POA: Diagnosis not present

## 2017-10-08 DIAGNOSIS — N898 Other specified noninflammatory disorders of vagina: Secondary | ICD-10-CM | POA: Diagnosis not present

## 2017-10-08 DIAGNOSIS — N952 Postmenopausal atrophic vaginitis: Secondary | ICD-10-CM | POA: Diagnosis not present

## 2017-10-08 DIAGNOSIS — N3642 Intrinsic sphincter deficiency (ISD): Secondary | ICD-10-CM | POA: Diagnosis not present

## 2017-10-29 DIAGNOSIS — M9902 Segmental and somatic dysfunction of thoracic region: Secondary | ICD-10-CM | POA: Diagnosis not present

## 2017-10-29 DIAGNOSIS — M791 Myalgia, unspecified site: Secondary | ICD-10-CM | POA: Diagnosis not present

## 2017-10-29 DIAGNOSIS — M9905 Segmental and somatic dysfunction of pelvic region: Secondary | ICD-10-CM | POA: Diagnosis not present

## 2017-10-29 DIAGNOSIS — M9903 Segmental and somatic dysfunction of lumbar region: Secondary | ICD-10-CM | POA: Diagnosis not present

## 2017-11-18 DIAGNOSIS — Z9889 Other specified postprocedural states: Secondary | ICD-10-CM | POA: Diagnosis not present

## 2017-12-01 DIAGNOSIS — M25511 Pain in right shoulder: Secondary | ICD-10-CM | POA: Diagnosis not present

## 2017-12-03 DIAGNOSIS — M9903 Segmental and somatic dysfunction of lumbar region: Secondary | ICD-10-CM | POA: Diagnosis not present

## 2017-12-03 DIAGNOSIS — M9905 Segmental and somatic dysfunction of pelvic region: Secondary | ICD-10-CM | POA: Diagnosis not present

## 2017-12-03 DIAGNOSIS — M791 Myalgia, unspecified site: Secondary | ICD-10-CM | POA: Diagnosis not present

## 2017-12-03 DIAGNOSIS — M9902 Segmental and somatic dysfunction of thoracic region: Secondary | ICD-10-CM | POA: Diagnosis not present

## 2017-12-08 DIAGNOSIS — M25611 Stiffness of right shoulder, not elsewhere classified: Secondary | ICD-10-CM | POA: Diagnosis not present

## 2017-12-08 DIAGNOSIS — M6281 Muscle weakness (generalized): Secondary | ICD-10-CM | POA: Diagnosis not present

## 2017-12-08 DIAGNOSIS — M25511 Pain in right shoulder: Secondary | ICD-10-CM | POA: Diagnosis not present

## 2017-12-10 DIAGNOSIS — M25611 Stiffness of right shoulder, not elsewhere classified: Secondary | ICD-10-CM | POA: Diagnosis not present

## 2017-12-10 DIAGNOSIS — M25511 Pain in right shoulder: Secondary | ICD-10-CM | POA: Diagnosis not present

## 2017-12-10 DIAGNOSIS — M6281 Muscle weakness (generalized): Secondary | ICD-10-CM | POA: Diagnosis not present

## 2017-12-16 DIAGNOSIS — M25511 Pain in right shoulder: Secondary | ICD-10-CM | POA: Diagnosis not present

## 2017-12-16 DIAGNOSIS — M6281 Muscle weakness (generalized): Secondary | ICD-10-CM | POA: Diagnosis not present

## 2017-12-16 DIAGNOSIS — M25611 Stiffness of right shoulder, not elsewhere classified: Secondary | ICD-10-CM | POA: Diagnosis not present

## 2017-12-18 DIAGNOSIS — M25511 Pain in right shoulder: Secondary | ICD-10-CM | POA: Diagnosis not present

## 2017-12-18 DIAGNOSIS — M25611 Stiffness of right shoulder, not elsewhere classified: Secondary | ICD-10-CM | POA: Diagnosis not present

## 2017-12-18 DIAGNOSIS — L570 Actinic keratosis: Secondary | ICD-10-CM | POA: Diagnosis not present

## 2017-12-18 DIAGNOSIS — L821 Other seborrheic keratosis: Secondary | ICD-10-CM | POA: Diagnosis not present

## 2017-12-18 DIAGNOSIS — Z85828 Personal history of other malignant neoplasm of skin: Secondary | ICD-10-CM | POA: Diagnosis not present

## 2017-12-18 DIAGNOSIS — D225 Melanocytic nevi of trunk: Secondary | ICD-10-CM | POA: Diagnosis not present

## 2017-12-18 DIAGNOSIS — L812 Freckles: Secondary | ICD-10-CM | POA: Diagnosis not present

## 2017-12-18 DIAGNOSIS — M6281 Muscle weakness (generalized): Secondary | ICD-10-CM | POA: Diagnosis not present

## 2017-12-21 DIAGNOSIS — M25511 Pain in right shoulder: Secondary | ICD-10-CM | POA: Diagnosis not present

## 2017-12-21 DIAGNOSIS — M6281 Muscle weakness (generalized): Secondary | ICD-10-CM | POA: Diagnosis not present

## 2017-12-21 DIAGNOSIS — M25611 Stiffness of right shoulder, not elsewhere classified: Secondary | ICD-10-CM | POA: Diagnosis not present

## 2017-12-24 DIAGNOSIS — M6281 Muscle weakness (generalized): Secondary | ICD-10-CM | POA: Diagnosis not present

## 2017-12-24 DIAGNOSIS — M25511 Pain in right shoulder: Secondary | ICD-10-CM | POA: Diagnosis not present

## 2017-12-24 DIAGNOSIS — M25611 Stiffness of right shoulder, not elsewhere classified: Secondary | ICD-10-CM | POA: Diagnosis not present

## 2017-12-28 DIAGNOSIS — M6281 Muscle weakness (generalized): Secondary | ICD-10-CM | POA: Diagnosis not present

## 2017-12-28 DIAGNOSIS — M25511 Pain in right shoulder: Secondary | ICD-10-CM | POA: Diagnosis not present

## 2017-12-28 DIAGNOSIS — M25611 Stiffness of right shoulder, not elsewhere classified: Secondary | ICD-10-CM | POA: Diagnosis not present

## 2017-12-29 DIAGNOSIS — M25511 Pain in right shoulder: Secondary | ICD-10-CM | POA: Diagnosis not present

## 2017-12-30 DIAGNOSIS — M25511 Pain in right shoulder: Secondary | ICD-10-CM | POA: Diagnosis not present

## 2017-12-30 DIAGNOSIS — M25611 Stiffness of right shoulder, not elsewhere classified: Secondary | ICD-10-CM | POA: Diagnosis not present

## 2017-12-30 DIAGNOSIS — M6281 Muscle weakness (generalized): Secondary | ICD-10-CM | POA: Diagnosis not present

## 2017-12-31 DIAGNOSIS — M9905 Segmental and somatic dysfunction of pelvic region: Secondary | ICD-10-CM | POA: Diagnosis not present

## 2017-12-31 DIAGNOSIS — M791 Myalgia, unspecified site: Secondary | ICD-10-CM | POA: Diagnosis not present

## 2017-12-31 DIAGNOSIS — M9902 Segmental and somatic dysfunction of thoracic region: Secondary | ICD-10-CM | POA: Diagnosis not present

## 2017-12-31 DIAGNOSIS — M9903 Segmental and somatic dysfunction of lumbar region: Secondary | ICD-10-CM | POA: Diagnosis not present

## 2018-01-04 DIAGNOSIS — M6281 Muscle weakness (generalized): Secondary | ICD-10-CM | POA: Diagnosis not present

## 2018-01-04 DIAGNOSIS — M25611 Stiffness of right shoulder, not elsewhere classified: Secondary | ICD-10-CM | POA: Diagnosis not present

## 2018-01-04 DIAGNOSIS — M25511 Pain in right shoulder: Secondary | ICD-10-CM | POA: Diagnosis not present

## 2018-01-07 DIAGNOSIS — M6281 Muscle weakness (generalized): Secondary | ICD-10-CM | POA: Diagnosis not present

## 2018-01-07 DIAGNOSIS — M25611 Stiffness of right shoulder, not elsewhere classified: Secondary | ICD-10-CM | POA: Diagnosis not present

## 2018-01-07 DIAGNOSIS — M25511 Pain in right shoulder: Secondary | ICD-10-CM | POA: Diagnosis not present

## 2018-01-18 DIAGNOSIS — M25611 Stiffness of right shoulder, not elsewhere classified: Secondary | ICD-10-CM | POA: Diagnosis not present

## 2018-01-18 DIAGNOSIS — M25511 Pain in right shoulder: Secondary | ICD-10-CM | POA: Diagnosis not present

## 2018-01-18 DIAGNOSIS — M6281 Muscle weakness (generalized): Secondary | ICD-10-CM | POA: Diagnosis not present

## 2018-02-15 DIAGNOSIS — M9903 Segmental and somatic dysfunction of lumbar region: Secondary | ICD-10-CM | POA: Diagnosis not present

## 2018-02-15 DIAGNOSIS — M9905 Segmental and somatic dysfunction of pelvic region: Secondary | ICD-10-CM | POA: Diagnosis not present

## 2018-02-15 DIAGNOSIS — M9902 Segmental and somatic dysfunction of thoracic region: Secondary | ICD-10-CM | POA: Diagnosis not present

## 2018-02-15 DIAGNOSIS — M791 Myalgia, unspecified site: Secondary | ICD-10-CM | POA: Diagnosis not present

## 2018-02-23 ENCOUNTER — Other Ambulatory Visit: Payer: Self-pay | Admitting: Family Medicine

## 2018-02-23 DIAGNOSIS — Z1231 Encounter for screening mammogram for malignant neoplasm of breast: Secondary | ICD-10-CM

## 2018-03-08 DIAGNOSIS — I10 Essential (primary) hypertension: Secondary | ICD-10-CM | POA: Diagnosis not present

## 2018-03-08 DIAGNOSIS — E785 Hyperlipidemia, unspecified: Secondary | ICD-10-CM | POA: Diagnosis not present

## 2018-03-11 DIAGNOSIS — E785 Hyperlipidemia, unspecified: Secondary | ICD-10-CM | POA: Diagnosis not present

## 2018-03-11 DIAGNOSIS — I10 Essential (primary) hypertension: Secondary | ICD-10-CM | POA: Diagnosis not present

## 2018-03-11 DIAGNOSIS — Z86010 Personal history of colonic polyps: Secondary | ICD-10-CM | POA: Diagnosis not present

## 2018-03-11 DIAGNOSIS — R899 Unspecified abnormal finding in specimens from other organs, systems and tissues: Secondary | ICD-10-CM | POA: Diagnosis not present

## 2018-03-11 DIAGNOSIS — Z23 Encounter for immunization: Secondary | ICD-10-CM | POA: Diagnosis not present

## 2018-03-11 DIAGNOSIS — R7301 Impaired fasting glucose: Secondary | ICD-10-CM | POA: Diagnosis not present

## 2018-03-11 DIAGNOSIS — Z0000 Encounter for general adult medical examination without abnormal findings: Secondary | ICD-10-CM | POA: Diagnosis not present

## 2018-03-12 ENCOUNTER — Ambulatory Visit: Payer: PPO

## 2018-03-29 DIAGNOSIS — M9902 Segmental and somatic dysfunction of thoracic region: Secondary | ICD-10-CM | POA: Diagnosis not present

## 2018-03-29 DIAGNOSIS — M9903 Segmental and somatic dysfunction of lumbar region: Secondary | ICD-10-CM | POA: Diagnosis not present

## 2018-03-29 DIAGNOSIS — M9905 Segmental and somatic dysfunction of pelvic region: Secondary | ICD-10-CM | POA: Diagnosis not present

## 2018-03-29 DIAGNOSIS — M791 Myalgia, unspecified site: Secondary | ICD-10-CM | POA: Diagnosis not present

## 2018-03-30 ENCOUNTER — Ambulatory Visit: Payer: PPO

## 2018-04-01 DIAGNOSIS — M9903 Segmental and somatic dysfunction of lumbar region: Secondary | ICD-10-CM | POA: Diagnosis not present

## 2018-04-01 DIAGNOSIS — M9902 Segmental and somatic dysfunction of thoracic region: Secondary | ICD-10-CM | POA: Diagnosis not present

## 2018-04-01 DIAGNOSIS — M9905 Segmental and somatic dysfunction of pelvic region: Secondary | ICD-10-CM | POA: Diagnosis not present

## 2018-04-01 DIAGNOSIS — M791 Myalgia, unspecified site: Secondary | ICD-10-CM | POA: Diagnosis not present

## 2018-04-02 ENCOUNTER — Ambulatory Visit
Admission: RE | Admit: 2018-04-02 | Discharge: 2018-04-02 | Disposition: A | Discharge status: Home / Self Care | Payer: PPO | Source: Ambulatory Visit | Attending: Family Medicine | Admitting: Family Medicine

## 2018-04-02 DIAGNOSIS — Z1231 Encounter for screening mammogram for malignant neoplasm of breast: Secondary | ICD-10-CM | POA: Diagnosis not present

## 2018-04-06 DIAGNOSIS — M9902 Segmental and somatic dysfunction of thoracic region: Secondary | ICD-10-CM | POA: Diagnosis not present

## 2018-04-06 DIAGNOSIS — M9905 Segmental and somatic dysfunction of pelvic region: Secondary | ICD-10-CM | POA: Diagnosis not present

## 2018-04-06 DIAGNOSIS — M791 Myalgia, unspecified site: Secondary | ICD-10-CM | POA: Diagnosis not present

## 2018-04-06 DIAGNOSIS — M9903 Segmental and somatic dysfunction of lumbar region: Secondary | ICD-10-CM | POA: Diagnosis not present

## 2018-04-19 DIAGNOSIS — M9903 Segmental and somatic dysfunction of lumbar region: Secondary | ICD-10-CM | POA: Diagnosis not present

## 2018-04-19 DIAGNOSIS — M9902 Segmental and somatic dysfunction of thoracic region: Secondary | ICD-10-CM | POA: Diagnosis not present

## 2018-04-19 DIAGNOSIS — M791 Myalgia, unspecified site: Secondary | ICD-10-CM | POA: Diagnosis not present

## 2018-04-19 DIAGNOSIS — M9905 Segmental and somatic dysfunction of pelvic region: Secondary | ICD-10-CM | POA: Diagnosis not present

## 2018-05-04 DIAGNOSIS — M9903 Segmental and somatic dysfunction of lumbar region: Secondary | ICD-10-CM | POA: Diagnosis not present

## 2018-05-04 DIAGNOSIS — M9902 Segmental and somatic dysfunction of thoracic region: Secondary | ICD-10-CM | POA: Diagnosis not present

## 2018-05-04 DIAGNOSIS — M791 Myalgia, unspecified site: Secondary | ICD-10-CM | POA: Diagnosis not present

## 2018-05-04 DIAGNOSIS — M9905 Segmental and somatic dysfunction of pelvic region: Secondary | ICD-10-CM | POA: Diagnosis not present

## 2018-05-20 DIAGNOSIS — K5730 Diverticulosis of large intestine without perforation or abscess without bleeding: Secondary | ICD-10-CM | POA: Diagnosis not present

## 2018-05-20 DIAGNOSIS — K640 First degree hemorrhoids: Secondary | ICD-10-CM | POA: Diagnosis not present

## 2018-05-20 DIAGNOSIS — Z86010 Personal history of colonic polyps: Secondary | ICD-10-CM | POA: Diagnosis not present

## 2018-05-31 DIAGNOSIS — M9902 Segmental and somatic dysfunction of thoracic region: Secondary | ICD-10-CM | POA: Diagnosis not present

## 2018-05-31 DIAGNOSIS — M9903 Segmental and somatic dysfunction of lumbar region: Secondary | ICD-10-CM | POA: Diagnosis not present

## 2018-05-31 DIAGNOSIS — M791 Myalgia, unspecified site: Secondary | ICD-10-CM | POA: Diagnosis not present

## 2018-05-31 DIAGNOSIS — M9905 Segmental and somatic dysfunction of pelvic region: Secondary | ICD-10-CM | POA: Diagnosis not present

## 2018-06-01 DIAGNOSIS — E785 Hyperlipidemia, unspecified: Secondary | ICD-10-CM | POA: Diagnosis not present

## 2018-06-01 DIAGNOSIS — Z0000 Encounter for general adult medical examination without abnormal findings: Secondary | ICD-10-CM | POA: Diagnosis not present

## 2018-06-21 DIAGNOSIS — D2272 Melanocytic nevi of left lower limb, including hip: Secondary | ICD-10-CM | POA: Diagnosis not present

## 2018-06-21 DIAGNOSIS — D2271 Melanocytic nevi of right lower limb, including hip: Secondary | ICD-10-CM | POA: Diagnosis not present

## 2018-06-21 DIAGNOSIS — D224 Melanocytic nevi of scalp and neck: Secondary | ICD-10-CM | POA: Diagnosis not present

## 2018-06-21 DIAGNOSIS — D1801 Hemangioma of skin and subcutaneous tissue: Secondary | ICD-10-CM | POA: Diagnosis not present

## 2018-06-21 DIAGNOSIS — Z85828 Personal history of other malignant neoplasm of skin: Secondary | ICD-10-CM | POA: Diagnosis not present

## 2018-06-21 DIAGNOSIS — D2262 Melanocytic nevi of left upper limb, including shoulder: Secondary | ICD-10-CM | POA: Diagnosis not present

## 2018-06-21 DIAGNOSIS — L812 Freckles: Secondary | ICD-10-CM | POA: Diagnosis not present

## 2018-06-21 DIAGNOSIS — D2261 Melanocytic nevi of right upper limb, including shoulder: Secondary | ICD-10-CM | POA: Diagnosis not present

## 2018-06-21 DIAGNOSIS — D225 Melanocytic nevi of trunk: Secondary | ICD-10-CM | POA: Diagnosis not present

## 2018-06-21 DIAGNOSIS — L570 Actinic keratosis: Secondary | ICD-10-CM | POA: Diagnosis not present

## 2018-06-28 DIAGNOSIS — M9905 Segmental and somatic dysfunction of pelvic region: Secondary | ICD-10-CM | POA: Diagnosis not present

## 2018-06-28 DIAGNOSIS — M791 Myalgia, unspecified site: Secondary | ICD-10-CM | POA: Diagnosis not present

## 2018-06-28 DIAGNOSIS — M9902 Segmental and somatic dysfunction of thoracic region: Secondary | ICD-10-CM | POA: Diagnosis not present

## 2018-06-28 DIAGNOSIS — M9903 Segmental and somatic dysfunction of lumbar region: Secondary | ICD-10-CM | POA: Diagnosis not present

## 2018-07-26 DIAGNOSIS — M791 Myalgia, unspecified site: Secondary | ICD-10-CM | POA: Diagnosis not present

## 2018-07-26 DIAGNOSIS — M9905 Segmental and somatic dysfunction of pelvic region: Secondary | ICD-10-CM | POA: Diagnosis not present

## 2018-07-26 DIAGNOSIS — M9902 Segmental and somatic dysfunction of thoracic region: Secondary | ICD-10-CM | POA: Diagnosis not present

## 2018-07-26 DIAGNOSIS — M9903 Segmental and somatic dysfunction of lumbar region: Secondary | ICD-10-CM | POA: Diagnosis not present

## 2018-08-24 DIAGNOSIS — M791 Myalgia, unspecified site: Secondary | ICD-10-CM | POA: Diagnosis not present

## 2018-08-24 DIAGNOSIS — M9903 Segmental and somatic dysfunction of lumbar region: Secondary | ICD-10-CM | POA: Diagnosis not present

## 2018-08-24 DIAGNOSIS — M9902 Segmental and somatic dysfunction of thoracic region: Secondary | ICD-10-CM | POA: Diagnosis not present

## 2018-08-24 DIAGNOSIS — M9905 Segmental and somatic dysfunction of pelvic region: Secondary | ICD-10-CM | POA: Diagnosis not present

## 2018-09-20 DIAGNOSIS — M791 Myalgia, unspecified site: Secondary | ICD-10-CM | POA: Diagnosis not present

## 2018-09-20 DIAGNOSIS — M9905 Segmental and somatic dysfunction of pelvic region: Secondary | ICD-10-CM | POA: Diagnosis not present

## 2018-09-20 DIAGNOSIS — M9903 Segmental and somatic dysfunction of lumbar region: Secondary | ICD-10-CM | POA: Diagnosis not present

## 2018-09-20 DIAGNOSIS — M9902 Segmental and somatic dysfunction of thoracic region: Secondary | ICD-10-CM | POA: Diagnosis not present

## 2018-10-18 DIAGNOSIS — M9905 Segmental and somatic dysfunction of pelvic region: Secondary | ICD-10-CM | POA: Diagnosis not present

## 2018-10-18 DIAGNOSIS — M9903 Segmental and somatic dysfunction of lumbar region: Secondary | ICD-10-CM | POA: Diagnosis not present

## 2018-10-18 DIAGNOSIS — M791 Myalgia, unspecified site: Secondary | ICD-10-CM | POA: Diagnosis not present

## 2018-10-18 DIAGNOSIS — M9902 Segmental and somatic dysfunction of thoracic region: Secondary | ICD-10-CM | POA: Diagnosis not present

## 2018-11-15 DIAGNOSIS — M9902 Segmental and somatic dysfunction of thoracic region: Secondary | ICD-10-CM | POA: Diagnosis not present

## 2018-11-15 DIAGNOSIS — M9903 Segmental and somatic dysfunction of lumbar region: Secondary | ICD-10-CM | POA: Diagnosis not present

## 2018-11-15 DIAGNOSIS — M791 Myalgia, unspecified site: Secondary | ICD-10-CM | POA: Diagnosis not present

## 2018-11-15 DIAGNOSIS — M9905 Segmental and somatic dysfunction of pelvic region: Secondary | ICD-10-CM | POA: Diagnosis not present

## 2018-11-24 DIAGNOSIS — N952 Postmenopausal atrophic vaginitis: Secondary | ICD-10-CM | POA: Diagnosis not present

## 2018-12-31 DIAGNOSIS — D1801 Hemangioma of skin and subcutaneous tissue: Secondary | ICD-10-CM | POA: Diagnosis not present

## 2018-12-31 DIAGNOSIS — D2272 Melanocytic nevi of left lower limb, including hip: Secondary | ICD-10-CM | POA: Diagnosis not present

## 2018-12-31 DIAGNOSIS — L821 Other seborrheic keratosis: Secondary | ICD-10-CM | POA: Diagnosis not present

## 2018-12-31 DIAGNOSIS — Z85828 Personal history of other malignant neoplasm of skin: Secondary | ICD-10-CM | POA: Diagnosis not present

## 2018-12-31 DIAGNOSIS — D225 Melanocytic nevi of trunk: Secondary | ICD-10-CM | POA: Diagnosis not present

## 2018-12-31 DIAGNOSIS — D2271 Melanocytic nevi of right lower limb, including hip: Secondary | ICD-10-CM | POA: Diagnosis not present

## 2018-12-31 DIAGNOSIS — L570 Actinic keratosis: Secondary | ICD-10-CM | POA: Diagnosis not present

## 2019-01-03 DIAGNOSIS — M9905 Segmental and somatic dysfunction of pelvic region: Secondary | ICD-10-CM | POA: Diagnosis not present

## 2019-01-03 DIAGNOSIS — M9903 Segmental and somatic dysfunction of lumbar region: Secondary | ICD-10-CM | POA: Diagnosis not present

## 2019-01-03 DIAGNOSIS — M9902 Segmental and somatic dysfunction of thoracic region: Secondary | ICD-10-CM | POA: Diagnosis not present

## 2019-01-03 DIAGNOSIS — M791 Myalgia, unspecified site: Secondary | ICD-10-CM | POA: Diagnosis not present

## 2019-01-16 DIAGNOSIS — R05 Cough: Secondary | ICD-10-CM | POA: Diagnosis not present

## 2019-01-16 DIAGNOSIS — J069 Acute upper respiratory infection, unspecified: Secondary | ICD-10-CM | POA: Diagnosis not present

## 2019-01-16 DIAGNOSIS — J209 Acute bronchitis, unspecified: Secondary | ICD-10-CM | POA: Diagnosis not present

## 2019-02-09 DIAGNOSIS — M9902 Segmental and somatic dysfunction of thoracic region: Secondary | ICD-10-CM | POA: Diagnosis not present

## 2019-02-09 DIAGNOSIS — M9903 Segmental and somatic dysfunction of lumbar region: Secondary | ICD-10-CM | POA: Diagnosis not present

## 2019-02-09 DIAGNOSIS — M9905 Segmental and somatic dysfunction of pelvic region: Secondary | ICD-10-CM | POA: Diagnosis not present

## 2019-02-09 DIAGNOSIS — M791 Myalgia, unspecified site: Secondary | ICD-10-CM | POA: Diagnosis not present

## 2019-03-07 ENCOUNTER — Other Ambulatory Visit: Payer: Self-pay | Admitting: Family Medicine

## 2019-03-07 DIAGNOSIS — M9903 Segmental and somatic dysfunction of lumbar region: Secondary | ICD-10-CM | POA: Diagnosis not present

## 2019-03-07 DIAGNOSIS — M9905 Segmental and somatic dysfunction of pelvic region: Secondary | ICD-10-CM | POA: Diagnosis not present

## 2019-03-07 DIAGNOSIS — M9902 Segmental and somatic dysfunction of thoracic region: Secondary | ICD-10-CM | POA: Diagnosis not present

## 2019-03-07 DIAGNOSIS — M791 Myalgia, unspecified site: Secondary | ICD-10-CM | POA: Diagnosis not present

## 2019-03-07 DIAGNOSIS — Z1231 Encounter for screening mammogram for malignant neoplasm of breast: Secondary | ICD-10-CM

## 2019-03-16 DIAGNOSIS — E785 Hyperlipidemia, unspecified: Secondary | ICD-10-CM | POA: Diagnosis not present

## 2019-03-16 DIAGNOSIS — R7301 Impaired fasting glucose: Secondary | ICD-10-CM | POA: Diagnosis not present

## 2019-03-16 DIAGNOSIS — R8290 Unspecified abnormal findings in urine: Secondary | ICD-10-CM | POA: Diagnosis not present

## 2019-03-16 DIAGNOSIS — Z79899 Other long term (current) drug therapy: Secondary | ICD-10-CM | POA: Diagnosis not present

## 2019-03-16 DIAGNOSIS — R3121 Asymptomatic microscopic hematuria: Secondary | ICD-10-CM | POA: Diagnosis not present

## 2019-03-16 DIAGNOSIS — Z86010 Personal history of colonic polyps: Secondary | ICD-10-CM | POA: Diagnosis not present

## 2019-03-16 DIAGNOSIS — Z0000 Encounter for general adult medical examination without abnormal findings: Secondary | ICD-10-CM | POA: Diagnosis not present

## 2019-04-28 DIAGNOSIS — D485 Neoplasm of uncertain behavior of skin: Secondary | ICD-10-CM | POA: Diagnosis not present

## 2019-04-28 DIAGNOSIS — C44722 Squamous cell carcinoma of skin of right lower limb, including hip: Secondary | ICD-10-CM | POA: Diagnosis not present

## 2019-04-28 DIAGNOSIS — Z85828 Personal history of other malignant neoplasm of skin: Secondary | ICD-10-CM | POA: Diagnosis not present

## 2019-05-02 DIAGNOSIS — M791 Myalgia, unspecified site: Secondary | ICD-10-CM | POA: Diagnosis not present

## 2019-05-02 DIAGNOSIS — M9902 Segmental and somatic dysfunction of thoracic region: Secondary | ICD-10-CM | POA: Diagnosis not present

## 2019-05-02 DIAGNOSIS — M9903 Segmental and somatic dysfunction of lumbar region: Secondary | ICD-10-CM | POA: Diagnosis not present

## 2019-05-02 DIAGNOSIS — M9905 Segmental and somatic dysfunction of pelvic region: Secondary | ICD-10-CM | POA: Diagnosis not present

## 2019-05-09 ENCOUNTER — Other Ambulatory Visit: Payer: Self-pay

## 2019-05-09 ENCOUNTER — Ambulatory Visit
Admission: RE | Admit: 2019-05-09 | Discharge: 2019-05-09 | Disposition: A | Discharge status: Home / Self Care | Payer: PPO | Source: Ambulatory Visit | Attending: Family Medicine | Admitting: Family Medicine

## 2019-05-09 DIAGNOSIS — Z1231 Encounter for screening mammogram for malignant neoplasm of breast: Secondary | ICD-10-CM

## 2019-05-17 ENCOUNTER — Ambulatory Visit: Payer: PPO

## 2019-05-17 DIAGNOSIS — Z6823 Body mass index (BMI) 23.0-23.9, adult: Secondary | ICD-10-CM | POA: Diagnosis not present

## 2019-05-17 DIAGNOSIS — Z01419 Encounter for gynecological examination (general) (routine) without abnormal findings: Secondary | ICD-10-CM | POA: Diagnosis not present

## 2019-05-17 DIAGNOSIS — Z124 Encounter for screening for malignant neoplasm of cervix: Secondary | ICD-10-CM | POA: Diagnosis not present

## 2019-05-18 DIAGNOSIS — M9903 Segmental and somatic dysfunction of lumbar region: Secondary | ICD-10-CM | POA: Diagnosis not present

## 2019-05-18 DIAGNOSIS — M9905 Segmental and somatic dysfunction of pelvic region: Secondary | ICD-10-CM | POA: Diagnosis not present

## 2019-05-18 DIAGNOSIS — M9902 Segmental and somatic dysfunction of thoracic region: Secondary | ICD-10-CM | POA: Diagnosis not present

## 2019-05-18 DIAGNOSIS — M791 Myalgia, unspecified site: Secondary | ICD-10-CM | POA: Diagnosis not present

## 2019-06-21 DIAGNOSIS — R8290 Unspecified abnormal findings in urine: Secondary | ICD-10-CM | POA: Diagnosis not present

## 2019-06-21 DIAGNOSIS — E785 Hyperlipidemia, unspecified: Secondary | ICD-10-CM | POA: Diagnosis not present

## 2019-06-21 DIAGNOSIS — Z0000 Encounter for general adult medical examination without abnormal findings: Secondary | ICD-10-CM | POA: Diagnosis not present

## 2019-06-21 DIAGNOSIS — Z86010 Personal history of colonic polyps: Secondary | ICD-10-CM | POA: Diagnosis not present

## 2019-06-21 DIAGNOSIS — R7301 Impaired fasting glucose: Secondary | ICD-10-CM | POA: Diagnosis not present

## 2019-06-21 DIAGNOSIS — Z79899 Other long term (current) drug therapy: Secondary | ICD-10-CM | POA: Diagnosis not present

## 2019-06-27 DIAGNOSIS — M9903 Segmental and somatic dysfunction of lumbar region: Secondary | ICD-10-CM | POA: Diagnosis not present

## 2019-06-27 DIAGNOSIS — M9902 Segmental and somatic dysfunction of thoracic region: Secondary | ICD-10-CM | POA: Diagnosis not present

## 2019-06-27 DIAGNOSIS — M791 Myalgia, unspecified site: Secondary | ICD-10-CM | POA: Diagnosis not present

## 2019-06-27 DIAGNOSIS — M9905 Segmental and somatic dysfunction of pelvic region: Secondary | ICD-10-CM | POA: Diagnosis not present

## 2019-07-25 DIAGNOSIS — M791 Myalgia, unspecified site: Secondary | ICD-10-CM | POA: Diagnosis not present

## 2019-07-25 DIAGNOSIS — M9902 Segmental and somatic dysfunction of thoracic region: Secondary | ICD-10-CM | POA: Diagnosis not present

## 2019-07-25 DIAGNOSIS — M9903 Segmental and somatic dysfunction of lumbar region: Secondary | ICD-10-CM | POA: Diagnosis not present

## 2019-07-25 DIAGNOSIS — M9905 Segmental and somatic dysfunction of pelvic region: Secondary | ICD-10-CM | POA: Diagnosis not present

## 2019-07-27 DIAGNOSIS — H5202 Hypermetropia, left eye: Secondary | ICD-10-CM | POA: Diagnosis not present

## 2019-07-27 DIAGNOSIS — H524 Presbyopia: Secondary | ICD-10-CM | POA: Diagnosis not present

## 2019-07-27 DIAGNOSIS — H52222 Regular astigmatism, left eye: Secondary | ICD-10-CM | POA: Diagnosis not present

## 2019-07-27 DIAGNOSIS — H5211 Myopia, right eye: Secondary | ICD-10-CM | POA: Diagnosis not present

## 2019-08-23 DIAGNOSIS — M9905 Segmental and somatic dysfunction of pelvic region: Secondary | ICD-10-CM | POA: Diagnosis not present

## 2019-08-23 DIAGNOSIS — C801 Malignant (primary) neoplasm, unspecified: Secondary | ICD-10-CM

## 2019-08-23 DIAGNOSIS — M9902 Segmental and somatic dysfunction of thoracic region: Secondary | ICD-10-CM | POA: Diagnosis not present

## 2019-08-23 DIAGNOSIS — M9903 Segmental and somatic dysfunction of lumbar region: Secondary | ICD-10-CM | POA: Diagnosis not present

## 2019-08-23 DIAGNOSIS — M791 Myalgia, unspecified site: Secondary | ICD-10-CM | POA: Diagnosis not present

## 2019-08-23 HISTORY — DX: Malignant (primary) neoplasm, unspecified: C80.1

## 2019-09-19 DIAGNOSIS — L820 Inflamed seborrheic keratosis: Secondary | ICD-10-CM | POA: Diagnosis not present

## 2019-09-19 DIAGNOSIS — Z85828 Personal history of other malignant neoplasm of skin: Secondary | ICD-10-CM | POA: Diagnosis not present

## 2019-09-19 DIAGNOSIS — C4371 Malignant melanoma of right lower limb, including hip: Secondary | ICD-10-CM | POA: Diagnosis not present

## 2019-09-19 DIAGNOSIS — L821 Other seborrheic keratosis: Secondary | ICD-10-CM | POA: Diagnosis not present

## 2019-09-19 DIAGNOSIS — D485 Neoplasm of uncertain behavior of skin: Secondary | ICD-10-CM | POA: Diagnosis not present

## 2019-09-27 DIAGNOSIS — M791 Myalgia, unspecified site: Secondary | ICD-10-CM | POA: Diagnosis not present

## 2019-09-27 DIAGNOSIS — M9903 Segmental and somatic dysfunction of lumbar region: Secondary | ICD-10-CM | POA: Diagnosis not present

## 2019-09-27 DIAGNOSIS — M9905 Segmental and somatic dysfunction of pelvic region: Secondary | ICD-10-CM | POA: Diagnosis not present

## 2019-09-27 DIAGNOSIS — M9902 Segmental and somatic dysfunction of thoracic region: Secondary | ICD-10-CM | POA: Diagnosis not present

## 2019-10-03 ENCOUNTER — Other Ambulatory Visit: Payer: Self-pay | Admitting: General Surgery

## 2019-10-03 DIAGNOSIS — C4371 Malignant melanoma of right lower limb, including hip: Secondary | ICD-10-CM | POA: Diagnosis not present

## 2019-10-07 NOTE — H&P (Signed)
LOCHLYNN KUSEK Documented: 10/03/2019 2:12 PM Location: [facility] Patient #: [id number] DOB: 12-Aug-1951 Married / Language: English / Race: White Female   History of Present Illness Stark Klein MD; 10/03/2019 3:24 PM) The patient is a 68 year old female who presents with malignant melanoma. Pt is a 68 yo F who is referred by Dr. Jarome Matin for new dx of melanoma right calf 09/19/2019. She noted a mole there in late june. It started to change and she was going to have it looked at in July, but had a family emergency and had to move her appointment. By the time she saw Dr. Ronnald Ramp, the lesion had become more raised and it had multiple colors in it (black, brown, red). It also itched a little. Dr. Ronnald Ramp biopsied it and it was seen to be a superficial spreading melanoma 1.375 mm. Clarks IV. peripheral margins positive for MIS. deep margins positive for invasive melanoma. Mitoses were 8/mm2. No LVI or neurotropism was seen. NO satellitosis was seen. TIL were present.   She has no other cancer history other than squamous cell cancer. She is up to date wtih colonoscopy and mammogram. She has no family history of cancer.    Past Surgical History Nance Pew, CMA; 10/03/2019 2:12 PM) Appendectomy  Breast Biopsy  Left. Colon Polyp Removal - Colonoscopy  Foot Surgery  Left.  Diagnostic Studies History Nance Pew, CMA; 10/03/2019 2:12 PM) Colonoscopy  within last year Mammogram  within last year Pap Smear  1-5 years ago  Allergies Nance Pew, CMA; 10/03/2019 2:13 PM) Codeine/Codeine Derivatives  Allergies Reconciled   Medication History Nance Pew, CMA; 10/03/2019 2:14 PM) Simvastatin (40MG  Tablet, Oral) Active. Meloxicam (7.5MG  Tablet, Oral) Active. Fish Oil (875MG  Capsule, Oral) Active. Glucosamine Chondr 500 Complex (Oral) Active. Vital-D Rx (1MG  Tablet, Oral) Active. Medications Reconciled  Social History Gabriel Cirri Rugby, CMA;  10/03/2019 2:12 PM) Alcohol use  Occasional alcohol use. No caffeine use  No drug use  Tobacco use  Never smoker.  Family History Nance Pew, [place]; 10/03/2019 2:12 PM) Arthritis  Father, Mother. Cerebrovascular Accident  Father, Mother. Diabetes Mellitus  Mother. Heart Disease  Father. Heart disease in female family member before age 40  Heart disease in female family member before age 44  Hypertension  Brother, Father, Mother, Sister.  Pregnancy / Birth History Nance Pew, [place]; 10/03/2019 2:12 PM) Age at menarche  67 years. Age of menopause  58-60 Contraceptive History  Oral contraceptives. Gravida  3 Irregular periods  Length (months) of breastfeeding  3-6 Maternal age  9-25 Para  74  Other Problems Nance Pew, CMA; 10/03/2019 2:12 PM) Arthritis  Bladder Problems  High blood pressure  Hypercholesterolemia  Lump In Breast     Review of Systems (Sabrina Canty CMA; 10/03/2019 2:12 PM) General Not Present- Appetite Loss, Chills, Fatigue, Fever, Night Sweats, Weight Gain and Weight Loss. Skin Present- Change in Wart/Mole. Not Present- Dryness, Hives, Jaundice, New Lesions, Non-Healing Wounds, Rash and Ulcer. HEENT Present- Hearing Loss and Wears glasses/contact lenses. Not Present- Earache, Hoarseness, Nose Bleed, Oral Ulcers, Ringing in the Ears, Seasonal Allergies, Sinus Pain, Sore Throat, Visual Disturbances and Yellow Eyes. Breast Not Present- Breast Mass, Breast Pain, Nipple Discharge and Skin Changes. Cardiovascular Not Present- Chest Pain, Difficulty Breathing Lying Down, Leg Cramps, Palpitations, Rapid Heart Rate, Shortness of Breath and Swelling of Extremities. Gastrointestinal Not Present- Abdominal Pain, Bloating, Bloody Stool, Change in Bowel Habits, Chronic diarrhea, Constipation, Difficulty Swallowing, Excessive gas, Gets full quickly at meals, Hemorrhoids, Indigestion, Nausea,  Rectal Pain and Vomiting. Female Genitourinary Not  Present- Frequency, Nocturia, Painful Urination, Pelvic Pain and Urgency. Musculoskeletal Not Present- Back Pain, Joint Pain, Joint Stiffness, Muscle Pain, Muscle Weakness and Swelling of Extremities. Neurological Not Present- Decreased Memory, Fainting, Headaches, Numbness, Seizures, Tingling, Tremor, Trouble walking and Weakness. Psychiatric Not Present- Anxiety, Bipolar, Change in Sleep Pattern, Depression, Fearful and Frequent crying. Endocrine Not Present- Cold Intolerance, Excessive Hunger, Hair Changes, Heat Intolerance, Hot flashes and New Diabetes. Hematology Not Present- Blood Thinners, Easy Bruising, Excessive bleeding, Gland problems, HIV and Persistent Infections.  Vitals (Sabrina Canty CMA; 10/03/2019 2:15 PM) 10/03/2019 2:14 PM Weight: 166 lb Height: 69in Body Surface Area: 1.91 m Body Mass Index: 24.51 kg/m  Temp.: 59F (Temporal)  Pulse: 71 (Regular)  BP: 148/84(Sitting, Left Arm, Standard)       Physical Exam Stark Klein MD; 10/03/2019 3:25 PM) General Mental Status-Alert. General Appearance-Consistent with stated age. Hydration-Well hydrated. Voice-Normal.  Integumentary Note: posteromedial thigh has healing biopsy scar.   Head and Neck Head-normocephalic, atraumatic with no lesions or palpable masses. Trachea-midline. Thyroid Gland Characteristics - normal size and consistency.  Eye Eyeball - Bilateral-Extraocular movements intact. Sclera/Conjunctiva - Bilateral-No scleral icterus.  Chest and Lung Exam Chest and lung exam reveals -quiet, even and easy respiratory effort with no use of accessory muscles and on auscultation, normal breath sounds, no adventitious sounds and normal vocal resonance. Inspection Chest Wall - Normal. Back - normal.  Cardiovascular Cardiovascular examination reveals -normal heart sounds, regular rate and rhythm with no murmurs and normal pedal pulses  bilaterally.  Abdomen Inspection Inspection of the abdomen reveals - No Hernias. Palpation/Percussion Palpation and Percussion of the abdomen reveal - Soft, Non Tender, No Rebound tenderness, No Rigidity (guarding) and No hepatosplenomegaly. Auscultation Auscultation of the abdomen reveals - Bowel sounds normal.  Neurologic Neurologic evaluation reveals -alert and oriented x 3 with no impairment of recent or remote memory. Mental Status-Normal.  Musculoskeletal Global Assessment -Note: no gross deformities.  Normal Exam - Left-Upper Extremity Strength Normal and Lower Extremity Strength Normal. Normal Exam - Right-Upper Extremity Strength Normal and Lower Extremity Strength Normal.  Lymphatic Head & Neck  General Head & Neck Lymphatics: Bilateral - Description - Normal. Axillary  General Axillary Region: Bilateral - Description - Normal. Tenderness - Non Tender. Femoral & Inguinal  Generalized Femoral & Inguinal Lymphatics: Bilateral - Description - No Generalized lymphadenopathy.    Assessment & Plan Stark Klein MD; 10/03/2019 3:28 PM)  MELANOMA OF RIGHT POSTERIOR CALF (C43.71) Impression: Will plan wide local excision 1-2 cm margins and SLN mapping/biopsy.  Discussed risks of surgery including numbness, wound infection/dehiscence, seroma, pain, recurrence, bleeding, size of scar.  Reviewed surgery and post op care/recovery/restrictions. Pt informed that we would need additional surgery if margins were positive. We discussed that if nodes were positive that we would get PET scan and refer to oncology. Patient would like to proceed.  Current Plans You are being scheduled for surgery- Our schedulers will call you.  You should hear from our office's scheduling department within 5 working days about the location, date, and time of surgery. We try to make accommodations for patient's preferences in scheduling surgery, but sometimes the OR schedule or the  surgeon's schedule prevents [place] from making those accommodations.  If you have not heard from our office [phone]) in 5 working days, call the office and ask for your surgeon's nurse.  If you have other questions about your diagnosis, plan, or surgery, call the office and ask for your surgeon's  nurse.  Pt Education - Melanoma: skin cancer   Signed by Stark Klein, MD (10/03/2019 3:28 PM)

## 2019-10-10 ENCOUNTER — Other Ambulatory Visit: Payer: Self-pay

## 2019-10-10 ENCOUNTER — Encounter (HOSPITAL_COMMUNITY): Payer: Self-pay

## 2019-10-10 ENCOUNTER — Other Ambulatory Visit (HOSPITAL_COMMUNITY)
Admission: RE | Admit: 2019-10-10 | Discharge: 2019-10-10 | Disposition: A | Discharge status: Home / Self Care | Payer: PPO | Source: Ambulatory Visit | Attending: General Surgery | Admitting: General Surgery

## 2019-10-10 ENCOUNTER — Encounter (HOSPITAL_COMMUNITY)
Admission: RE | Admit: 2019-10-10 | Discharge: 2019-10-10 | Disposition: A | Discharge status: Home / Self Care | Payer: PPO | Source: Ambulatory Visit | Attending: General Surgery | Admitting: General Surgery

## 2019-10-10 DIAGNOSIS — C4371 Malignant melanoma of right lower limb, including hip: Secondary | ICD-10-CM | POA: Insufficient documentation

## 2019-10-10 DIAGNOSIS — Z79899 Other long term (current) drug therapy: Secondary | ICD-10-CM | POA: Insufficient documentation

## 2019-10-10 DIAGNOSIS — Z01812 Encounter for preprocedural laboratory examination: Secondary | ICD-10-CM | POA: Insufficient documentation

## 2019-10-10 DIAGNOSIS — Z01818 Encounter for other preprocedural examination: Secondary | ICD-10-CM | POA: Diagnosis not present

## 2019-10-10 DIAGNOSIS — Z20828 Contact with and (suspected) exposure to other viral communicable diseases: Secondary | ICD-10-CM | POA: Insufficient documentation

## 2019-10-10 DIAGNOSIS — I10 Essential (primary) hypertension: Secondary | ICD-10-CM | POA: Insufficient documentation

## 2019-10-10 HISTORY — DX: Pneumonia, unspecified organism: J18.9

## 2019-10-10 HISTORY — DX: Unspecified osteoarthritis, unspecified site: M19.90

## 2019-10-10 HISTORY — DX: Hyperlipidemia, unspecified: E78.5

## 2019-10-10 LAB — COMPREHENSIVE METABOLIC PANEL
ALT: 24 U/L (ref 0–44)
AST: 29 U/L (ref 15–41)
Albumin: 4.3 g/dL (ref 3.5–5.0)
Alkaline Phosphatase: 71 U/L (ref 38–126)
Anion gap: 10 (ref 5–15)
BUN: 22 mg/dL (ref 8–23)
CO2: 24 mmol/L (ref 22–32)
Calcium: 9.8 mg/dL (ref 8.9–10.3)
Chloride: 103 mmol/L (ref 98–111)
Creatinine, Ser: 0.9 mg/dL (ref 0.44–1.00)
GFR calc Af Amer: >60 mL/min (ref >60)
GFR calc non Af Amer: >60 mL/min (ref >60)
Glucose, Bld: 94 mg/dL (ref 70–99)
Potassium: 3.5 mmol/L (ref 3.5–5.1)
Sodium: 137 mmol/L (ref 135–145)
Total Bilirubin: 1.1 mg/dL (ref 0.3–1.2)
Total Protein: 7.5 g/dL (ref 6.5–8.1)

## 2019-10-10 LAB — CBC WITH DIFFERENTIAL/PLATELET
Abs Immature Granulocytes: 0.01 10*3/uL (ref 0.00–0.07)
Basophils Absolute: 0.1 10*3/uL (ref 0.0–0.1)
Basophils Relative: 1 %
Eosinophils Absolute: 0 10*3/uL (ref 0.0–0.5)
Eosinophils Relative: 0 %
HCT: 41 % (ref 36.0–46.0)
Hemoglobin: 13.6 g/dL (ref 12.0–15.0)
Immature Granulocytes: 0 %
Lymphocytes Relative: 23 %
Lymphs Abs: 1.6 10*3/uL (ref 0.7–4.0)
MCH: 30.5 pg (ref 26.0–34.0)
MCHC: 33.2 g/dL (ref 30.0–36.0)
MCV: 91.9 fL (ref 80.0–100.0)
Monocytes Absolute: 0.5 10*3/uL (ref 0.1–1.0)
Monocytes Relative: 7 %
Neutro Abs: 4.8 10*3/uL (ref 1.7–7.7)
Neutrophils Relative %: 69 %
Platelets: 209 10*3/uL (ref 150–400)
RBC: 4.46 MIL/uL (ref 3.87–5.11)
RDW: 13 % (ref 11.5–15.5)
WBC: 6.9 10*3/uL (ref 4.0–10.5)
nRBC: 0 % (ref 0.0–0.2)

## 2019-10-10 NOTE — Progress Notes (Signed)
Patient denies shortness of breath, fever, cough and chest pain.  PCP - Dr Hulan Fess Cardiologist - Denies  Chest x-ray - Denies EKG - 10/10/19 Stress Test - 04/07/14 ECHO - 04/07/14 Cardiac Cath - Denies  ERAS: Clears til 4:30 am DOS, no drink  Anesthesia review: No  Coronavirus Screening Have you or experienced the following symptoms:  Cough yes/no: No Fever (>100.49F)  yes/no: No Runny nose yes/no: No Sore throat yes/no: No Difficulty breathing/shortness of breath  yes/no: No  Have you or  traveled in the last 14 days and where? yes/no: No  Covid 10/10/19 test pending.

## 2019-10-10 NOTE — Progress Notes (Signed)
[facility] [place], [address] AT [address] [place] [address] Phone: [phone] Fax: [phone]      Your procedure is scheduled on Thursday, 10/22.  Report to [facility] Main Entrance "A" at 5:30 A.M., and check in at the Admitting office.  Call this number if you have problems the morning of surgery:  [phone]  Call [phone] if you have any questions prior to your surgery date Monday-Friday 8am-4pm    Remember:  Do not eat after midnight the night before your surgery - Wed.  You may drink clear liquids until 4:30 am the morning of your surgery.   Clear liquids allowed are: Water, Non-Citrus Juices (without pulp), Carbonated Beverages, Clear Tea, Black Coffee Only, and Gatorade    Take these medicines the morning of surgery with A SIP OF WATER : simvastatin (ZOCOR)   STOP now taking any Aspirin (unless otherwise instructed by your surgeon), Aleve, Naproxen, Ibuprofen, Motrin, Advil, Goody's, BC's, all herbal medications, fish oil, and all vitamins.    The Morning of Surgery  Do not wear jewelry, make-up or nail polish.  Do not wear lotions, powders,  perfumes or deodorant  Do not shave 48 hours prior to surgery.    Do not bring valuables to the hospital.  [facility] is not responsible for any belongings or valuables.  If you are a smoker, DO NOT Smoke 24 hours prior to surgery IF you wear a CPAP at night please bring your mask, tubing, and machine the morning of surgery   Remember that you must have someone to transport you home after your surgery, and remain with you for 24 hours if you are discharged the same day.  Contacts, glasses, hearing aids, dentures or bridgework may not be worn into surgery.   Leave your suitcase in the car.  After surgery it may be brought to your room.  For patients admitted to the hospital, discharge time will be determined by your treatment  team.  Patients discharged the day of surgery will not be allowed to drive home.    Special instructions:   [place]- Preparing For Surgery  Before surgery, you can play an important role. Because skin is not sterile, your skin needs to be as free of germs as possible. You can reduce the number of germs on your skin by washing with CHG (chlorahexidine gluconate) Soap before surgery.  CHG is an antiseptic cleaner which kills germs and bonds with the skin to continue killing germs even after washing.    Oral Hygiene is also important to reduce your risk of infection.  Remember - BRUSH YOUR TEETH THE MORNING OF SURGERY WITH YOUR REGULAR TOOTHPASTE  Please do not use if you have an allergy to CHG or antibacterial soaps. If your skin becomes reddened/irritated stop using the CHG.  Do not shave (including legs and underarms) for at least 48 hours prior to first CHG shower. It is OK to shave your face.  Please follow these instructions carefully.   1. Shower the NIGHT BEFORE SURGERY and the MORNING OF SURGERY with CHG Soap.   2. If you chose to wash your hair, wash your hair first as usual with your normal shampoo.  3. After you shampoo, rinse your hair and body thoroughly to remove the shampoo.  4. Use CHG as you would any other liquid soap. You can apply CHG directly to the skin and wash gently with a  scrungie or a clean washcloth.   5. Apply the CHG Soap to your body ONLY FROM THE NECK DOWN.  Do not use on open wounds or open sores. Avoid contact with your eyes, ears, mouth and genitals (private parts). Wash Face and genitals (private parts)  with your normal soap.   6. Wash thoroughly, paying special attention to the area where your surgery will be performed.  7. Thoroughly rinse your body with warm water from the neck down.  8. DO NOT shower/wash with your normal soap after using and rinsing off the CHG Soap.  9. Pat yourself dry with a CLEAN TOWEL.  10. Wear CLEAN PAJAMAS to bed  the night before surgery, wear comfortable clothes the morning of surgery  11. Place CLEAN SHEETS on your bed the night of your first shower and DO NOT SLEEP WITH PETS.    Day of Surgery:  Do not apply any deodorants/lotions. Please shower the morning of surgery with the CHG soap  Please wear clean clothes to the hospital/surgery center.   Remember to brush your teeth WITH YOUR REGULAR TOOTHPASTE.   Please read over the following fact sheets that you were given.

## 2019-10-11 LAB — NOVEL CORONAVIRUS, NAA (HOSP ORDER, SEND-OUT TO REF LAB; TAT 18-24 HRS): SARS-CoV-2, NAA: NOT DETECTED

## 2019-10-12 NOTE — Anesthesia Preprocedure Evaluation (Addendum)
Anesthesia Evaluation  Patient identified by MRN, date of birth, ID band Patient awake    Reviewed: Allergy & Precautions, H&P , NPO status , Patient's Chart, lab work & pertinent test results  Airway Mallampati: II  TM Distance: >3 FB Neck ROM: Full    Dental no notable dental hx. (+) Teeth Intact, Dental Advisory Given   Pulmonary neg pulmonary ROS,    Pulmonary exam normal breath sounds clear to auscultation       Cardiovascular Exercise Tolerance: Good hypertension, negative cardio ROS Normal cardiovascular exam Rhythm:Regular Rate:Normal  ECHO 4/15 LV size was normal.  - LV global systolic function was normal. The estimated LV  ejection fraction was 60%.  - Normal wall motion; no LV regional wall motion  abnormalities.    Neuro/Psych negative neurological ROS  negative psych ROS   GI/Hepatic negative GI ROS, Neg liver ROS,   Endo/Other  negative endocrine ROS  Renal/GU negative Renal ROS  negative genitourinary   Musculoskeletal negative musculoskeletal ROS (+)   Abdominal   Peds negative pediatric ROS (+)  Hematology negative hematology ROS (+)   Anesthesia Other Findings   Reproductive/Obstetrics negative OB ROS                            Anesthesia Physical Anesthesia Plan  ASA: II  Anesthesia Plan: General   Post-op Pain Management:    Induction: Intravenous  PONV Risk Score and Plan: 3 and Dexamethasone, Ondansetron and Treatment may vary due to age or medical condition  Airway Management Planned: Oral ETT and LMA  Additional Equipment:   Intra-op Plan:   Post-operative Plan: Extubation in OR  Informed Consent: I have reviewed the patients History and Physical, chart, labs and discussed the procedure including the risks, benefits and alternatives for the proposed anesthesia with the patient or authorized representative who has indicated his/her understanding  and acceptance.       Plan Discussed with: CRNA, Anesthesiologist and Surgeon  Anesthesia Plan Comments: (  )        Anesthesia Quick Evaluation

## 2019-10-13 ENCOUNTER — Encounter (HOSPITAL_COMMUNITY)
Admission: RE | Admit: 2019-10-13 | Discharge: 2019-10-13 | Disposition: A | Discharge status: Home / Self Care | Payer: PPO | Source: Ambulatory Visit | Attending: General Surgery | Admitting: General Surgery

## 2019-10-13 ENCOUNTER — Ambulatory Visit (HOSPITAL_COMMUNITY): Payer: PPO | Admitting: Certified Registered"

## 2019-10-13 ENCOUNTER — Other Ambulatory Visit: Payer: Self-pay

## 2019-10-13 ENCOUNTER — Ambulatory Visit (HOSPITAL_COMMUNITY): Payer: PPO | Admitting: Vascular Surgery

## 2019-10-13 ENCOUNTER — Encounter (HOSPITAL_COMMUNITY): Payer: Self-pay

## 2019-10-13 ENCOUNTER — Ambulatory Visit (HOSPITAL_COMMUNITY)
Admission: RE | Admit: 2019-10-13 | Discharge: 2019-10-13 | Disposition: A | Discharge status: Home / Self Care | Payer: PPO | Attending: General Surgery | Admitting: General Surgery

## 2019-10-13 ENCOUNTER — Encounter (HOSPITAL_COMMUNITY)
Admission: RE | Disposition: A | Discharge status: Home / Self Care | Payer: Self-pay | Source: Home / Self Care | Attending: General Surgery

## 2019-10-13 DIAGNOSIS — E7800 Pure hypercholesterolemia, unspecified: Secondary | ICD-10-CM | POA: Insufficient documentation

## 2019-10-13 DIAGNOSIS — Z79899 Other long term (current) drug therapy: Secondary | ICD-10-CM | POA: Insufficient documentation

## 2019-10-13 DIAGNOSIS — Z885 Allergy status to narcotic agent status: Secondary | ICD-10-CM | POA: Insufficient documentation

## 2019-10-13 DIAGNOSIS — L989 Disorder of the skin and subcutaneous tissue, unspecified: Secondary | ICD-10-CM | POA: Diagnosis not present

## 2019-10-13 DIAGNOSIS — I10 Essential (primary) hypertension: Secondary | ICD-10-CM | POA: Insufficient documentation

## 2019-10-13 DIAGNOSIS — Z791 Long term (current) use of non-steroidal anti-inflammatories (NSAID): Secondary | ICD-10-CM | POA: Diagnosis not present

## 2019-10-13 DIAGNOSIS — Z85828 Personal history of other malignant neoplasm of skin: Secondary | ICD-10-CM | POA: Insufficient documentation

## 2019-10-13 DIAGNOSIS — C4371 Malignant melanoma of right lower limb, including hip: Secondary | ICD-10-CM

## 2019-10-13 HISTORY — PX: MELANOMA EXCISION WITH SENTINEL LYMPH NODE BIOPSY: SHX5267

## 2019-10-13 SURGERY — MELANOMA EXCISION WITH SENTINEL LYMPH NODE BIOPSY
Anesthesia: General | Site: Leg Lower | Laterality: Right

## 2019-10-13 MED ORDER — METHYLENE BLUE 0.5 % INJ SOLN
INTRAVENOUS | Status: AC
  Filled 2019-10-13: qty 10

## 2019-10-13 MED ORDER — METHYLENE BLUE 1 % INJ SOLN
INTRAMUSCULAR | Status: DC | PRN
  Administered 2019-10-13: 1 mL

## 2019-10-13 MED ORDER — CEFAZOLIN SODIUM-DEXTROSE 2-4 GM/100ML-% IV SOLN
INTRAVENOUS | Status: AC
  Filled 2019-10-13: qty 100

## 2019-10-13 MED ORDER — ACETAMINOPHEN 500 MG PO TABS
1000.0000 mg | ORAL_TABLET | ORAL | Status: AC
  Administered 2019-10-13: 06:00:00 1000 mg via ORAL

## 2019-10-13 MED ORDER — LIDOCAINE HCL 1 % IJ SOLN
INTRAMUSCULAR | Status: DC | PRN
  Administered 2019-10-13: 23 mL via INTRAMUSCULAR

## 2019-10-13 MED ORDER — 0.9 % SODIUM CHLORIDE (POUR BTL) OPTIME
TOPICAL | Status: DC | PRN
  Administered 2019-10-13: 08:00:00 1000 mL

## 2019-10-13 MED ORDER — ACETAMINOPHEN 160 MG/5ML PO SOLN: 325.0000 mg | ORAL | Status: DC | PRN

## 2019-10-13 MED ORDER — CHLORHEXIDINE GLUCONATE CLOTH 2 % EX PADS: 6.0000 | MEDICATED_PAD | Freq: Once | CUTANEOUS | Status: DC

## 2019-10-13 MED ORDER — TECHNETIUM TC 99M SULFUR COLLOID FILTERED
0.5000 | Freq: Once | INTRAVENOUS | Status: AC | PRN
  Administered 2019-10-13: 0.5 via INTRADERMAL

## 2019-10-13 MED ORDER — PROPOFOL 10 MG/ML IV BOLUS
INTRAVENOUS | Status: DC | PRN
  Administered 2019-10-13: 160 mg via INTRAVENOUS

## 2019-10-13 MED ORDER — ACETAMINOPHEN 500 MG PO TABS
ORAL_TABLET | ORAL | Status: AC
  Administered 2019-10-13: 1000 mg via ORAL
  Filled 2019-10-13: qty 2

## 2019-10-13 MED ORDER — ACETAMINOPHEN 325 MG PO TABS
ORAL_TABLET | ORAL | Status: AC
  Filled 2019-10-13: qty 2

## 2019-10-13 MED ORDER — MIDAZOLAM HCL 2 MG/2ML IJ SOLN
INTRAMUSCULAR | Status: AC
  Filled 2019-10-13: qty 2

## 2019-10-13 MED ORDER — BUPIVACAINE-EPINEPHRINE (PF) 0.25% -1:200000 IJ SOLN
INTRAMUSCULAR | Status: AC
  Filled 2019-10-13: qty 30

## 2019-10-13 MED ORDER — ONDANSETRON HCL 4 MG/2ML IJ SOLN
INTRAMUSCULAR | Status: AC
  Filled 2019-10-13: qty 2

## 2019-10-13 MED ORDER — OXYCODONE HCL 5 MG/5ML PO SOLN: 5.0000 mg | Freq: Once | ORAL | Status: AC | PRN

## 2019-10-13 MED ORDER — PHENYLEPHRINE 40 MCG/ML (10ML) SYRINGE FOR IV PUSH (FOR BLOOD PRESSURE SUPPORT)
PREFILLED_SYRINGE | INTRAVENOUS | Status: AC
  Filled 2019-10-13: qty 10

## 2019-10-13 MED ORDER — MIDAZOLAM HCL 5 MG/5ML IJ SOLN
INTRAMUSCULAR | Status: DC | PRN
  Administered 2019-10-13: 2 mg via INTRAVENOUS

## 2019-10-13 MED ORDER — LACTATED RINGERS IV SOLN
INTRAVENOUS | Status: DC | PRN
  Administered 2019-10-13: 07:00:00 via INTRAVENOUS

## 2019-10-13 MED ORDER — OXYCODONE HCL 5 MG PO TABS
ORAL_TABLET | ORAL | Status: AC
  Filled 2019-10-13: qty 1

## 2019-10-13 MED ORDER — EPHEDRINE SULFATE-NACL 50-0.9 MG/10ML-% IV SOSY
PREFILLED_SYRINGE | INTRAVENOUS | Status: DC | PRN
  Administered 2019-10-13 (×2): 10 mg via INTRAVENOUS
  Administered 2019-10-13 (×2): 5 mg via INTRAVENOUS

## 2019-10-13 MED ORDER — ACETAMINOPHEN 325 MG PO TABS
325.0000 mg | ORAL_TABLET | ORAL | Status: DC | PRN
  Administered 2019-10-13: 650 mg via ORAL

## 2019-10-13 MED ORDER — SODIUM CHLORIDE (PF) 0.9 % IJ SOLN
INTRAMUSCULAR | Status: AC
  Filled 2019-10-13: qty 10

## 2019-10-13 MED ORDER — PROPOFOL 10 MG/ML IV BOLUS
INTRAVENOUS | Status: AC
  Filled 2019-10-13: qty 40

## 2019-10-13 MED ORDER — LIDOCAINE HCL (PF) 1 % IJ SOLN
INTRAMUSCULAR | Status: AC
  Filled 2019-10-13: qty 30

## 2019-10-13 MED ORDER — CEFAZOLIN SODIUM-DEXTROSE 2-4 GM/100ML-% IV SOLN
2.0000 g | INTRAVENOUS | Status: AC
  Administered 2019-10-13: 2 g via INTRAVENOUS

## 2019-10-13 MED ORDER — LIDOCAINE 2% (20 MG/ML) 5 ML SYRINGE
INTRAMUSCULAR | Status: DC | PRN
  Administered 2019-10-13: 40 mg via INTRAVENOUS

## 2019-10-13 MED ORDER — PHENYLEPHRINE 40 MCG/ML (10ML) SYRINGE FOR IV PUSH (FOR BLOOD PRESSURE SUPPORT)
PREFILLED_SYRINGE | INTRAVENOUS | Status: DC | PRN
  Administered 2019-10-13: 40 ug via INTRAVENOUS

## 2019-10-13 MED ORDER — ONDANSETRON HCL 4 MG/2ML IJ SOLN: 4.0000 mg | Freq: Once | INTRAMUSCULAR | Status: DC | PRN

## 2019-10-13 MED ORDER — LIDOCAINE 2% (20 MG/ML) 5 ML SYRINGE
INTRAMUSCULAR | Status: AC
  Filled 2019-10-13: qty 5

## 2019-10-13 MED ORDER — FENTANYL CITRATE (PF) 100 MCG/2ML IJ SOLN: 25.0000 ug | INTRAMUSCULAR | Status: DC | PRN

## 2019-10-13 MED ORDER — ONDANSETRON HCL 4 MG/2ML IJ SOLN
INTRAMUSCULAR | Status: DC | PRN
  Administered 2019-10-13: 4 mg via INTRAVENOUS

## 2019-10-13 MED ORDER — OXYCODONE HCL 5 MG PO TABS: 2.5000 mg | ORAL_TABLET | Freq: Four times a day (QID) | ORAL | 0 refills | Status: DC | PRN

## 2019-10-13 MED ORDER — MEPERIDINE HCL 25 MG/ML IJ SOLN: 6.2500 mg | INTRAMUSCULAR | Status: DC | PRN

## 2019-10-13 MED ORDER — DEXAMETHASONE SODIUM PHOSPHATE 10 MG/ML IJ SOLN
INTRAMUSCULAR | Status: DC | PRN
  Administered 2019-10-13: 4 mg via INTRAVENOUS

## 2019-10-13 MED ORDER — FENTANYL CITRATE (PF) 250 MCG/5ML IJ SOLN
INTRAMUSCULAR | Status: AC
  Filled 2019-10-13: qty 5

## 2019-10-13 MED ORDER — STERILE WATER FOR IRRIGATION IR SOLN
Status: DC | PRN
  Administered 2019-10-13: 1000 mL

## 2019-10-13 MED ORDER — DEXAMETHASONE SODIUM PHOSPHATE 10 MG/ML IJ SOLN
INTRAMUSCULAR | Status: AC
  Filled 2019-10-13: qty 1

## 2019-10-13 MED ORDER — OXYCODONE HCL 5 MG PO TABS
5.0000 mg | ORAL_TABLET | Freq: Once | ORAL | Status: AC | PRN
  Administered 2019-10-13: 5 mg via ORAL

## 2019-10-13 MED ORDER — FENTANYL CITRATE (PF) 100 MCG/2ML IJ SOLN
INTRAMUSCULAR | Status: DC | PRN
  Administered 2019-10-13: 50 ug via INTRAVENOUS
  Administered 2019-10-13: 25 ug via INTRAVENOUS

## 2019-10-13 SURGICAL SUPPLY — 65 items
ADH SKN CLS APL DERMABOND .7 (GAUZE/BANDAGES/DRESSINGS) ×1
APL PRP STRL LF DISP 70% ISPRP (MISCELLANEOUS) ×2
BLADE SURG 10 STRL SS (BLADE) ×3 IMPLANT
BNDG COHESIVE 3X5 TAN STRL LF (GAUZE/BANDAGES/DRESSINGS) ×2 IMPLANT
BNDG COHESIVE 4X5 TAN STRL (GAUZE/BANDAGES/DRESSINGS) ×2 IMPLANT
BNDG GAUZE ELAST 4 BULKY (GAUZE/BANDAGES/DRESSINGS) ×3 IMPLANT
CANISTER SUCT 3000ML PPV (MISCELLANEOUS) ×3 IMPLANT
CHLORAPREP W/TINT 26 (MISCELLANEOUS) ×5 IMPLANT
CLIP VESOCCLUDE MED 24/CT (CLIP) ×3 IMPLANT
CLIP VESOCCLUDE SM WIDE 24/CT (CLIP) ×3 IMPLANT
CLOSURE STERI-STRIP 1/4X4 (GAUZE/BANDAGES/DRESSINGS) ×2 IMPLANT
CLOSURE WOUND 1/2 X4 (GAUZE/BANDAGES/DRESSINGS) ×1
CONT SPEC 4OZ CLIKSEAL STRL BL (MISCELLANEOUS) ×9 IMPLANT
COVER MAYO STAND STRL (DRAPES) IMPLANT
COVER PROBE W GEL 5X96 (DRAPES) ×3 IMPLANT
COVER SURGICAL LIGHT HANDLE (MISCELLANEOUS) ×3 IMPLANT
COVER WAND RF STERILE (DRAPES) ×1 IMPLANT
DECANTER SPIKE VIAL GLASS SM (MISCELLANEOUS) ×3 IMPLANT
DERMABOND ADVANCED (GAUZE/BANDAGES/DRESSINGS) ×2
DERMABOND ADVANCED .7 DNX12 (GAUZE/BANDAGES/DRESSINGS) IMPLANT
DRAPE HALF SHEET 40X57 (DRAPES) ×3 IMPLANT
DRAPE LAPAROSCOPIC ABDOMINAL (DRAPES) ×1 IMPLANT
DRSG TEGADERM 4X4.75 (GAUZE/BANDAGES/DRESSINGS) ×2 IMPLANT
DRSG XEROFORM 1X8 (GAUZE/BANDAGES/DRESSINGS) ×2 IMPLANT
ELECT REM PT RETURN 9FT ADLT (ELECTROSURGICAL) ×3
ELECTRODE REM PT RTRN 9FT ADLT (ELECTROSURGICAL) ×1 IMPLANT
GAUZE SPONGE 2X2 8PLY STRL LF (GAUZE/BANDAGES/DRESSINGS) ×1 IMPLANT
GAUZE SPONGE 4X4 12PLY STRL (GAUZE/BANDAGES/DRESSINGS) ×2 IMPLANT
GLOVE BIO SURGEON STRL SZ 6 (GLOVE) ×3 IMPLANT
GLOVE BIOGEL PI IND STRL 6.5 (GLOVE) IMPLANT
GLOVE BIOGEL PI INDICATOR 6.5 (GLOVE) ×2
GLOVE INDICATOR 6.5 STRL GRN (GLOVE) ×3 IMPLANT
GOWN STRL REUS W/ TWL LRG LVL3 (GOWN DISPOSABLE) ×2 IMPLANT
GOWN STRL REUS W/TWL 2XL LVL3 (GOWN DISPOSABLE) ×4 IMPLANT
GOWN STRL REUS W/TWL LRG LVL3 (GOWN DISPOSABLE) ×9
KIT BASIN OR (CUSTOM PROCEDURE TRAY) ×3 IMPLANT
KIT TURNOVER KIT B (KITS) ×3 IMPLANT
MARKER SKIN DUAL TIP RULER LAB (MISCELLANEOUS) ×3 IMPLANT
NDL 18GX1X1/2 (RX/OR ONLY) (NEEDLE) ×1 IMPLANT
NDL FILTER BLUNT 18X1 1/2 (NEEDLE) IMPLANT
NDL HYPO 25GX1X1/2 BEV (NEEDLE) ×1 IMPLANT
NEEDLE 18GX1X1/2 (RX/OR ONLY) (NEEDLE) ×3 IMPLANT
NEEDLE 22X1 1/2 (OR ONLY) (NEEDLE) ×3 IMPLANT
NEEDLE FILTER BLUNT 18X 1/2SAF (NEEDLE) ×2
NEEDLE FILTER BLUNT 18X1 1/2 (NEEDLE) ×1 IMPLANT
NEEDLE HYPO 25GX1X1/2 BEV (NEEDLE) ×3 IMPLANT
NS IRRIG 1000ML POUR BTL (IV SOLUTION) ×3 IMPLANT
PACK GENERAL/GYN (CUSTOM PROCEDURE TRAY) ×3 IMPLANT
PACK UNIVERSAL I (CUSTOM PROCEDURE TRAY) IMPLANT
PAD ARMBOARD 7.5X6 YLW CONV (MISCELLANEOUS) ×6 IMPLANT
PENCIL SMOKE EVACUATOR (MISCELLANEOUS) ×3 IMPLANT
SPECIMEN JAR MEDIUM (MISCELLANEOUS) ×3 IMPLANT
SPONGE GAUZE 2X2 STER 10/PKG (GAUZE/BANDAGES/DRESSINGS)
STOCKINETTE IMPERVIOUS 9X36 MD (GAUZE/BANDAGES/DRESSINGS) ×3 IMPLANT
STRIP CLOSURE SKIN 1/2X4 (GAUZE/BANDAGES/DRESSINGS) ×2 IMPLANT
SUT ETHILON 2 0 FS 18 (SUTURE) ×3 IMPLANT
SUT MNCRL AB 4-0 PS2 18 (SUTURE) ×3 IMPLANT
SUT SILK 2 0 PERMA HAND 18 BK (SUTURE) ×3 IMPLANT
SUT VIC AB 2-0 SH 27 (SUTURE) ×6
SUT VIC AB 2-0 SH 27XBRD (SUTURE) ×2 IMPLANT
SUT VIC AB 3-0 SH 27 (SUTURE) ×6
SUT VIC AB 3-0 SH 27X BRD (SUTURE) ×2 IMPLANT
SYR CONTROL 10ML LL (SYRINGE) ×6 IMPLANT
TOWEL GREEN STERILE (TOWEL DISPOSABLE) ×3 IMPLANT
TOWEL GREEN STERILE FF (TOWEL DISPOSABLE) ×3 IMPLANT

## 2019-10-13 NOTE — Transfer of Care (Signed)
Immediate Anesthesia Transfer of Care Note  Patient: Claire Wall  Procedure(s) Performed: WIDE LOCAL EXCISION WITH ADVANCEMENT FLAP CLOSURE AND SENTINEL LYMPH NODE BIOPSY (Right Leg Lower)  Patient Location: PACU  Anesthesia Type:General  Level of Consciousness: drowsy and patient cooperative  Airway & Oxygen Therapy: Patient Spontanous Breathing and Patient connected to nasal cannula oxygen  Post-op Assessment: Report given to RN and Post -op Vital signs reviewed and stable  Post vital signs: Reviewed and stable  Last Vitals:  Vitals Value Taken Time  BP 134/71 10/13/19 0925  Temp    Pulse 66 10/13/19 0928  Resp 14 10/13/19 0928  SpO2 99 % 10/13/19 0928  Vitals shown include unvalidated device data.  Last Pain:  Vitals:   10/13/19 0925  TempSrc:   PainSc: (P) 0-No pain      Patients Stated Pain Goal: 4 ([id number])  Complications: No apparent anesthesia complications

## 2019-10-13 NOTE — Anesthesia Procedure Notes (Signed)
Procedure Name: LMA Insertion Date/Time: 10/13/2019 7:46 AM Performed by: Colin Benton, CRNA Pre-anesthesia Checklist: Patient identified, Emergency Drugs available, Suction available and Patient being monitored Patient Re-evaluated:Patient Re-evaluated prior to induction Oxygen Delivery Method: Circle system utilized Preoxygenation: Pre-oxygenation with 100% oxygen Induction Type: IV induction Ventilation: Mask ventilation without difficulty LMA: LMA inserted LMA Size: 4.0 Number of attempts: 1 Placement Confirmation: positive ETCO2 and breath sounds checked- equal and bilateral Tube secured with: Tape Dental Injury: Teeth and Oropharynx as per pre-operative assessment

## 2019-10-13 NOTE — Progress Notes (Signed)
Dr. Ambrose Pancoast made aware of patient's elevated BP.  No new orders received.  Will treat during surgery if needed.

## 2019-10-13 NOTE — Discharge Instructions (Signed)
[place] Office Phone Number [phone]   POST OP INSTRUCTIONS  Always review your discharge instruction sheet given to you by the facility where your surgery was performed.  IF YOU HAVE DISABILITY OR FAMILY LEAVE FORMS, YOU MUST BRING THEM TO THE OFFICE FOR PROCESSING.  DO NOT GIVE THEM TO YOUR DOCTOR.  1. A prescription for pain medication may be given to you upon discharge.  Take your pain medication as prescribed, if needed.  If narcotic pain medicine is not needed, then you may take acetaminophen (Tylenol) or ibuprofen (Advil) as needed. 2. Take your usually prescribed medications unless otherwise directed 3. If you need a refill on your pain medication, please contact your pharmacy.  They will contact our office to request authorization.  Prescriptions will not be filled after 5pm or on week-ends. 4. You should eat very light the first 24 hours after surgery, such as soup, crackers, pudding, etc.  Resume your normal diet the day after surgery 5. It is common to experience some constipation if taking pain medication after surgery.  Increasing fluid intake and taking a stool softener will usually help or prevent this problem from occurring.  A mild laxative (Milk of Magnesia or Miralax) should be taken according to package directions if there are no bowel movements after 48 hours. 6. You may shower in 48 hours.  The surgical glue will flake off in 2-3 weeks.   7. ACTIVITIES:  No strenuous activity or heavy lifting for 1 week.   a. You may drive when you no longer are taking prescription pain medication, you can comfortably wear a seatbelt, and you can safely maneuver your car and apply brakes. b. RETURN TO WORK:  [id number] weeks if applicable.  _______________ Claire Wall should see your doctor in the office for a follow-up appointment approximately three-four weeks after your surgery.    WHEN TO CALL YOUR DOCTOR: 1. Fever over 101.0 2. Nausea and/or vomiting. 3. Extreme  swelling or bruising. 4. Continued bleeding from incision. 5. Increased pain, redness, or drainage from the incision.  The clinic staff is available to answer your questions during regular business hours.  Please dont hesitate to call and ask to speak to one of the nurses for clinical concerns.  If you have a medical emergency, go to the nearest emergency room or call 911.  A surgeon from [facility] is always on call at the hospital.  For further questions, please visit [email or web address]

## 2019-10-13 NOTE — Interval H&P Note (Signed)
History and Physical Interval Note:  10/13/2019 7:30 AM  Claire Wall  has presented today for surgery, with the diagnosis of MELANOMA RIGHT CALF.  The various methods of treatment have been discussed with the patient and family. After consideration of risks, benefits and other options for treatment, the patient has consented to  Procedure(s): WIDE LOCAL EXCISION WITH ADVANCEMENT FLAP CLOSURE AND SENTINEL LYMPH NODE BIOPSY (Right) as a surgical intervention.  The patient's history has been reviewed, patient examined, no change in status, stable for surgery.  I have reviewed the patient's chart and labs.  Questions were answered to the patient's satisfaction.     Stark Klein

## 2019-10-13 NOTE — Op Note (Signed)
PRE-OPERATIVE DIAGNOSIS: cT2N0 right lower leg melanoma  POST-OPERATIVE DIAGNOSIS:  Same  PROCEDURE:  Procedure(s): Wide local excision 1-2 cm margins, advancement flap closure for defect 3.5 cm x 7.1 cm, right inguinal sentinel lymph node mapping and biopsy  SURGEON:  Surgeon(s): Stark Klein, MD  ASSIST:  April Green, RNFA  ANESTHESIA:   local and general  DRAINS: none   LOCAL MEDICATIONS USED:  MARCAINE    and XYLOCAINE   SPECIMEN:  Source of Specimen:  two right inguinal sentinel lymph nodes, wide local excision right calf melanoma   FINDINGS:  No residual melanoma seen visibly.  SLN #1 hot, cps 293; SLn #2 cps 26; background count 0  DISPOSITION OF SPECIMEN:  PATHOLOGY  COUNTS:  YES  PLAN OF CARE: Discharge to home after PACU  PATIENT DISPOSITION:  PACU - hemodynamically stable.    PROCEDURE:   Pt was identified in the holding area, taken to the OR, and placed supine on the OR table.  General anesthesia was induced.  Time out was performed according to the surgical safety checklist.  When all was correct, we continued.  One mL methylene blue was injected intradermally around the melanoma biopsy site.    The patient was placed into the supine frogleg position.  The right extremity and groin was prepped and draped in sterile fashion.  The melanoma was identified and 1-2 cm margins were marked out.  Local was administered under the melanoma and the adjacent tissue.  A #10 blade was used to incise the skin around the melanoma.  The cautery was used to take the dissection down to the fascia.  The skin was marked in situ with orientation sutures.  The cautery was used to take the specimen off the fascia, and it was passed off the table.    Skin hooks were used to elevate the edges of the incision and the skin was freed up in all directions.  This was pulled together in a longitudinal orientation. The skin was pulled together to check the tension. . Deep interrupted 2-0 vicryl  sutures were placed to relieve tension.  The skin was then reapproximated with 3-0 interrupted vicryl deep dermal sutures and 4-0 monocryl running subcuticular sutures.  Three 2-0 nylon horizontal mattress sutures were placed as well.    The point of maximum signal intensity in the groin was identified with the neoprobe.  A 4 cm incision was made with a #15 blade.  The subcutaneous tissues were divided with the cautery.  A Weitlaner retractor was used to assist with visualization.  The tonsil clamp was used to bluntly dissect the fat pad.  Two sentinel lymph nodes were identified as described above.  The lymphovascular channels were clipped with hemoclips.  The nodes were passed off as specimens.  Hemostasis was achieved with the cautery.  The axilla was irrigated and closed with 3-0 Vicryl deep dermal interrupted sutures and 4-0 Monocryl running subcuticular suture.  The groin was dressed with dermabond.    The melanoma site was cleaned, dried, and dressed with Benzoin, steristrips, gauze, and tegaderm.    Needle, sponge, and instrument counts were correct.  The patient was awakened from anesthesia and taken to the PACU in stable condition.

## 2019-10-14 ENCOUNTER — Encounter (HOSPITAL_COMMUNITY): Payer: Self-pay | Admitting: General Surgery

## 2019-10-14 NOTE — Anesthesia Postprocedure Evaluation (Signed)
Anesthesia Post Note  Patient: Genevra B Ginther  Procedure(s) Performed: WIDE LOCAL EXCISION WITH ADVANCEMENT FLAP CLOSURE AND SENTINEL LYMPH NODE BIOPSY (Right Leg Lower)     Patient location during evaluation: PACU Anesthesia Type: General Level of consciousness: awake and alert Pain management: pain level controlled Vital Signs Assessment: post-procedure vital signs reviewed and stable Respiratory status: spontaneous breathing, nonlabored ventilation, respiratory function stable and patient connected to nasal cannula oxygen Cardiovascular status: blood pressure returned to baseline and stable Postop Assessment: no apparent nausea or vomiting Anesthetic complications: no    Last Vitals:  Vitals:   10/13/19 0940 10/13/19 0955  BP: 136/72 129/80  Pulse: 67 62  Resp: 13 15  Temp:    SpO2: 97% 100%    Last Pain:  Vitals:   10/13/19 0940  TempSrc:   PainSc: 5                  Helane Briceno

## 2019-10-19 LAB — SURGICAL PATHOLOGY

## 2019-10-19 NOTE — Progress Notes (Signed)
Please let patient know margins and lymph nodes are negative.

## 2019-11-23 DIAGNOSIS — M9902 Segmental and somatic dysfunction of thoracic region: Secondary | ICD-10-CM | POA: Diagnosis not present

## 2019-11-23 DIAGNOSIS — M791 Myalgia, unspecified site: Secondary | ICD-10-CM | POA: Diagnosis not present

## 2019-11-23 DIAGNOSIS — M9903 Segmental and somatic dysfunction of lumbar region: Secondary | ICD-10-CM | POA: Diagnosis not present

## 2019-11-23 DIAGNOSIS — M9905 Segmental and somatic dysfunction of pelvic region: Secondary | ICD-10-CM | POA: Diagnosis not present

## 2019-12-26 DIAGNOSIS — M9902 Segmental and somatic dysfunction of thoracic region: Secondary | ICD-10-CM | POA: Diagnosis not present

## 2019-12-26 DIAGNOSIS — M9903 Segmental and somatic dysfunction of lumbar region: Secondary | ICD-10-CM | POA: Diagnosis not present

## 2019-12-26 DIAGNOSIS — M791 Myalgia, unspecified site: Secondary | ICD-10-CM | POA: Diagnosis not present

## 2019-12-26 DIAGNOSIS — M9905 Segmental and somatic dysfunction of pelvic region: Secondary | ICD-10-CM | POA: Diagnosis not present

## 2020-01-09 DIAGNOSIS — L821 Other seborrheic keratosis: Secondary | ICD-10-CM | POA: Diagnosis not present

## 2020-01-09 DIAGNOSIS — D2272 Melanocytic nevi of left lower limb, including hip: Secondary | ICD-10-CM | POA: Diagnosis not present

## 2020-01-09 DIAGNOSIS — Z85820 Personal history of malignant melanoma of skin: Secondary | ICD-10-CM | POA: Diagnosis not present

## 2020-01-09 DIAGNOSIS — D1801 Hemangioma of skin and subcutaneous tissue: Secondary | ICD-10-CM | POA: Diagnosis not present

## 2020-01-09 DIAGNOSIS — D485 Neoplasm of uncertain behavior of skin: Secondary | ICD-10-CM | POA: Diagnosis not present

## 2020-01-09 DIAGNOSIS — D225 Melanocytic nevi of trunk: Secondary | ICD-10-CM | POA: Diagnosis not present

## 2020-01-09 DIAGNOSIS — L820 Inflamed seborrheic keratosis: Secondary | ICD-10-CM | POA: Diagnosis not present

## 2020-01-09 DIAGNOSIS — Z85828 Personal history of other malignant neoplasm of skin: Secondary | ICD-10-CM | POA: Diagnosis not present

## 2020-01-09 DIAGNOSIS — L812 Freckles: Secondary | ICD-10-CM | POA: Diagnosis not present

## 2020-01-12 ENCOUNTER — Ambulatory Visit: Payer: PPO | Attending: Internal Medicine

## 2020-01-12 ENCOUNTER — Ambulatory Visit: Payer: PPO

## 2020-01-12 DIAGNOSIS — Z23 Encounter for immunization: Secondary | ICD-10-CM | POA: Insufficient documentation

## 2020-01-12 NOTE — Progress Notes (Signed)
   Covid-19 Vaccination Clinic  Name:  CHRYL WAYSON    MRN: [id number] DOB: [birth year]  01/12/2020  Ms. Himes was observed post Covid-19 immunization for 15 minutes without incidence. She was provided with Vaccine Information Sheet and instruction to access the V-Safe system.   Ms. Eggebrecht was instructed to call 911 with any severe reactions post vaccine: Marland Kitchen Difficulty breathing  . Swelling of your face and throat  . A fast heartbeat  . A bad rash all over your body  . Dizziness and weakness    Immunizations Administered    Name Date Dose VIS Date Route   Pfizer COVID-19 Vaccine 01/12/2020  5:21 PM 0.3 mL 12/02/2019 Intramuscular   Manufacturer: [employer]   Lot: [id number]   [place]: [id number]

## 2020-01-27 DIAGNOSIS — M9903 Segmental and somatic dysfunction of lumbar region: Secondary | ICD-10-CM | POA: Diagnosis not present

## 2020-01-27 DIAGNOSIS — M791 Myalgia, unspecified site: Secondary | ICD-10-CM | POA: Diagnosis not present

## 2020-01-27 DIAGNOSIS — M9905 Segmental and somatic dysfunction of pelvic region: Secondary | ICD-10-CM | POA: Diagnosis not present

## 2020-01-27 DIAGNOSIS — M9902 Segmental and somatic dysfunction of thoracic region: Secondary | ICD-10-CM | POA: Diagnosis not present

## 2020-01-28 ENCOUNTER — Inpatient Hospital Stay (HOSPITAL_COMMUNITY): Admission: RE | Admit: 2020-01-28 | Payer: PPO | Source: Ambulatory Visit

## 2020-02-01 ENCOUNTER — Ambulatory Visit (HOSPITAL_BASED_OUTPATIENT_CLINIC_OR_DEPARTMENT_OTHER): Admit: 2020-02-01 | Payer: PPO | Admitting: General Surgery

## 2020-02-01 ENCOUNTER — Encounter (HOSPITAL_BASED_OUTPATIENT_CLINIC_OR_DEPARTMENT_OTHER): Payer: Self-pay

## 2020-02-01 SURGERY — DEBRIDEMENT, WOUND
Anesthesia: General | Laterality: Right

## 2020-02-02 ENCOUNTER — Ambulatory Visit: Payer: PPO | Attending: Internal Medicine

## 2020-02-02 DIAGNOSIS — Z23 Encounter for immunization: Secondary | ICD-10-CM | POA: Insufficient documentation

## 2020-02-02 NOTE — Progress Notes (Signed)
   Covid-19 Vaccination Clinic  Name:  Claire Wall    MRN: [id number] DOB: 1951/11/30  02/02/2020  [name] was observed post Covid-19 immunization for 30 minutes based on pre-vaccination screening without incidence. She was provided with Vaccine Information Sheet and instruction to access the V-Safe system.   [name] was instructed to call 911 with any severe reactions post vaccine: Marland Kitchen Difficulty breathing  . Swelling of your face and throat  . A fast heartbeat  . A bad rash all over your body  . Dizziness and weakness    Immunizations Administered    Name Date Dose VIS Date Route   Pfizer COVID-19 Vaccine 02/02/2020  1:28 AM 0.3 mL 12/02/2019 Intramuscular   Manufacturer: [employer], [employer]   Lot: [id number]   [place]: [id number]

## 2020-02-14 ENCOUNTER — Ambulatory Visit: Payer: PPO

## 2020-02-28 DIAGNOSIS — M9903 Segmental and somatic dysfunction of lumbar region: Secondary | ICD-10-CM | POA: Diagnosis not present

## 2020-02-28 DIAGNOSIS — M791 Myalgia, unspecified site: Secondary | ICD-10-CM | POA: Diagnosis not present

## 2020-02-28 DIAGNOSIS — M9902 Segmental and somatic dysfunction of thoracic region: Secondary | ICD-10-CM | POA: Diagnosis not present

## 2020-02-28 DIAGNOSIS — M9905 Segmental and somatic dysfunction of pelvic region: Secondary | ICD-10-CM | POA: Diagnosis not present

## 2020-03-21 DIAGNOSIS — M9903 Segmental and somatic dysfunction of lumbar region: Secondary | ICD-10-CM | POA: Diagnosis not present

## 2020-03-21 DIAGNOSIS — M791 Myalgia, unspecified site: Secondary | ICD-10-CM | POA: Diagnosis not present

## 2020-03-21 DIAGNOSIS — M9902 Segmental and somatic dysfunction of thoracic region: Secondary | ICD-10-CM | POA: Diagnosis not present

## 2020-03-21 DIAGNOSIS — M9905 Segmental and somatic dysfunction of pelvic region: Secondary | ICD-10-CM | POA: Diagnosis not present

## 2020-04-09 DIAGNOSIS — D2271 Melanocytic nevi of right lower limb, including hip: Secondary | ICD-10-CM | POA: Diagnosis not present

## 2020-04-09 DIAGNOSIS — Z85820 Personal history of malignant melanoma of skin: Secondary | ICD-10-CM | POA: Diagnosis not present

## 2020-04-09 DIAGNOSIS — L570 Actinic keratosis: Secondary | ICD-10-CM | POA: Diagnosis not present

## 2020-04-09 DIAGNOSIS — L821 Other seborrheic keratosis: Secondary | ICD-10-CM | POA: Diagnosis not present

## 2020-04-09 DIAGNOSIS — L812 Freckles: Secondary | ICD-10-CM | POA: Diagnosis not present

## 2020-04-09 DIAGNOSIS — D2272 Melanocytic nevi of left lower limb, including hip: Secondary | ICD-10-CM | POA: Diagnosis not present

## 2020-04-09 DIAGNOSIS — Z85828 Personal history of other malignant neoplasm of skin: Secondary | ICD-10-CM | POA: Diagnosis not present

## 2020-04-12 DIAGNOSIS — Z86010 Personal history of colonic polyps: Secondary | ICD-10-CM | POA: Diagnosis not present

## 2020-04-12 DIAGNOSIS — R8290 Unspecified abnormal findings in urine: Secondary | ICD-10-CM | POA: Diagnosis not present

## 2020-04-12 DIAGNOSIS — Z0000 Encounter for general adult medical examination without abnormal findings: Secondary | ICD-10-CM | POA: Diagnosis not present

## 2020-04-12 DIAGNOSIS — E785 Hyperlipidemia, unspecified: Secondary | ICD-10-CM | POA: Diagnosis not present

## 2020-04-12 DIAGNOSIS — Z13820 Encounter for screening for osteoporosis: Secondary | ICD-10-CM | POA: Diagnosis not present

## 2020-04-12 DIAGNOSIS — R3129 Other microscopic hematuria: Secondary | ICD-10-CM | POA: Diagnosis not present

## 2020-04-12 DIAGNOSIS — R7301 Impaired fasting glucose: Secondary | ICD-10-CM | POA: Diagnosis not present

## 2020-04-12 DIAGNOSIS — I10 Essential (primary) hypertension: Secondary | ICD-10-CM | POA: Diagnosis not present

## 2020-04-12 DIAGNOSIS — Z85820 Personal history of malignant melanoma of skin: Secondary | ICD-10-CM | POA: Diagnosis not present

## 2020-04-12 DIAGNOSIS — Z1159 Encounter for screening for other viral diseases: Secondary | ICD-10-CM | POA: Diagnosis not present

## 2020-04-16 ENCOUNTER — Other Ambulatory Visit: Payer: Self-pay | Admitting: Family Medicine

## 2020-04-16 DIAGNOSIS — Z13820 Encounter for screening for osteoporosis: Secondary | ICD-10-CM

## 2020-04-16 DIAGNOSIS — E2839 Other primary ovarian failure: Secondary | ICD-10-CM

## 2020-04-17 ENCOUNTER — Other Ambulatory Visit: Payer: Self-pay | Admitting: Family Medicine

## 2020-04-17 DIAGNOSIS — Z1231 Encounter for screening mammogram for malignant neoplasm of breast: Secondary | ICD-10-CM

## 2020-04-25 DIAGNOSIS — M9903 Segmental and somatic dysfunction of lumbar region: Secondary | ICD-10-CM | POA: Diagnosis not present

## 2020-04-25 DIAGNOSIS — M791 Myalgia, unspecified site: Secondary | ICD-10-CM | POA: Diagnosis not present

## 2020-04-25 DIAGNOSIS — M9902 Segmental and somatic dysfunction of thoracic region: Secondary | ICD-10-CM | POA: Diagnosis not present

## 2020-04-25 DIAGNOSIS — M9905 Segmental and somatic dysfunction of pelvic region: Secondary | ICD-10-CM | POA: Diagnosis not present

## 2020-05-14 ENCOUNTER — Ambulatory Visit
Admission: RE | Admit: 2020-05-14 | Discharge: 2020-05-14 | Disposition: A | Discharge status: Home / Self Care | Payer: PPO | Source: Ambulatory Visit

## 2020-05-14 ENCOUNTER — Other Ambulatory Visit: Payer: Self-pay

## 2020-05-14 DIAGNOSIS — Z1231 Encounter for screening mammogram for malignant neoplasm of breast: Secondary | ICD-10-CM | POA: Diagnosis not present

## 2020-05-22 DIAGNOSIS — M791 Myalgia, unspecified site: Secondary | ICD-10-CM | POA: Diagnosis not present

## 2020-05-22 DIAGNOSIS — M9905 Segmental and somatic dysfunction of pelvic region: Secondary | ICD-10-CM | POA: Diagnosis not present

## 2020-05-22 DIAGNOSIS — M9902 Segmental and somatic dysfunction of thoracic region: Secondary | ICD-10-CM | POA: Diagnosis not present

## 2020-05-22 DIAGNOSIS — M9903 Segmental and somatic dysfunction of lumbar region: Secondary | ICD-10-CM | POA: Diagnosis not present

## 2020-06-18 DIAGNOSIS — M9905 Segmental and somatic dysfunction of pelvic region: Secondary | ICD-10-CM | POA: Diagnosis not present

## 2020-06-18 DIAGNOSIS — M9903 Segmental and somatic dysfunction of lumbar region: Secondary | ICD-10-CM | POA: Diagnosis not present

## 2020-06-18 DIAGNOSIS — M9902 Segmental and somatic dysfunction of thoracic region: Secondary | ICD-10-CM | POA: Diagnosis not present

## 2020-06-18 DIAGNOSIS — M791 Myalgia, unspecified site: Secondary | ICD-10-CM | POA: Diagnosis not present

## 2020-06-19 ENCOUNTER — Ambulatory Visit
Admission: RE | Admit: 2020-06-19 | Discharge: 2020-06-19 | Disposition: A | Discharge status: Home / Self Care | Payer: PPO | Source: Ambulatory Visit | Attending: Family Medicine | Admitting: Family Medicine

## 2020-06-19 ENCOUNTER — Other Ambulatory Visit: Payer: Self-pay

## 2020-06-19 DIAGNOSIS — Z780 Asymptomatic menopausal state: Secondary | ICD-10-CM | POA: Diagnosis not present

## 2020-06-19 DIAGNOSIS — M85852 Other specified disorders of bone density and structure, left thigh: Secondary | ICD-10-CM | POA: Diagnosis not present

## 2020-06-19 DIAGNOSIS — E2839 Other primary ovarian failure: Secondary | ICD-10-CM

## 2020-06-19 DIAGNOSIS — Z13820 Encounter for screening for osteoporosis: Secondary | ICD-10-CM

## 2020-06-28 DIAGNOSIS — Z85820 Personal history of malignant melanoma of skin: Secondary | ICD-10-CM | POA: Diagnosis not present

## 2020-06-28 DIAGNOSIS — R8290 Unspecified abnormal findings in urine: Secondary | ICD-10-CM | POA: Diagnosis not present

## 2020-06-28 DIAGNOSIS — Z0000 Encounter for general adult medical examination without abnormal findings: Secondary | ICD-10-CM | POA: Diagnosis not present

## 2020-06-28 DIAGNOSIS — Z86010 Personal history of colonic polyps: Secondary | ICD-10-CM | POA: Diagnosis not present

## 2020-06-28 DIAGNOSIS — Z13820 Encounter for screening for osteoporosis: Secondary | ICD-10-CM | POA: Diagnosis not present

## 2020-06-28 DIAGNOSIS — R3129 Other microscopic hematuria: Secondary | ICD-10-CM | POA: Diagnosis not present

## 2020-06-28 DIAGNOSIS — E785 Hyperlipidemia, unspecified: Secondary | ICD-10-CM | POA: Diagnosis not present

## 2020-06-28 DIAGNOSIS — I10 Essential (primary) hypertension: Secondary | ICD-10-CM | POA: Diagnosis not present

## 2020-06-28 DIAGNOSIS — R7301 Impaired fasting glucose: Secondary | ICD-10-CM | POA: Diagnosis not present

## 2020-06-28 DIAGNOSIS — Z1159 Encounter for screening for other viral diseases: Secondary | ICD-10-CM | POA: Diagnosis not present

## 2020-07-10 DIAGNOSIS — Z85828 Personal history of other malignant neoplasm of skin: Secondary | ICD-10-CM | POA: Diagnosis not present

## 2020-07-10 DIAGNOSIS — D1801 Hemangioma of skin and subcutaneous tissue: Secondary | ICD-10-CM | POA: Diagnosis not present

## 2020-07-10 DIAGNOSIS — D2272 Melanocytic nevi of left lower limb, including hip: Secondary | ICD-10-CM | POA: Diagnosis not present

## 2020-07-10 DIAGNOSIS — L821 Other seborrheic keratosis: Secondary | ICD-10-CM | POA: Diagnosis not present

## 2020-07-10 DIAGNOSIS — D2271 Melanocytic nevi of right lower limb, including hip: Secondary | ICD-10-CM | POA: Diagnosis not present

## 2020-07-10 DIAGNOSIS — L812 Freckles: Secondary | ICD-10-CM | POA: Diagnosis not present

## 2020-07-10 DIAGNOSIS — Z85820 Personal history of malignant melanoma of skin: Secondary | ICD-10-CM | POA: Diagnosis not present

## 2020-07-10 DIAGNOSIS — D485 Neoplasm of uncertain behavior of skin: Secondary | ICD-10-CM | POA: Diagnosis not present

## 2020-07-10 DIAGNOSIS — D225 Melanocytic nevi of trunk: Secondary | ICD-10-CM | POA: Diagnosis not present

## 2020-07-10 DIAGNOSIS — C44519 Basal cell carcinoma of skin of other part of trunk: Secondary | ICD-10-CM | POA: Diagnosis not present

## 2020-07-16 DIAGNOSIS — M9903 Segmental and somatic dysfunction of lumbar region: Secondary | ICD-10-CM | POA: Diagnosis not present

## 2020-07-16 DIAGNOSIS — M791 Myalgia, unspecified site: Secondary | ICD-10-CM | POA: Diagnosis not present

## 2020-07-16 DIAGNOSIS — M9905 Segmental and somatic dysfunction of pelvic region: Secondary | ICD-10-CM | POA: Diagnosis not present

## 2020-07-16 DIAGNOSIS — M9902 Segmental and somatic dysfunction of thoracic region: Secondary | ICD-10-CM | POA: Diagnosis not present

## 2020-08-20 DIAGNOSIS — M791 Myalgia, unspecified site: Secondary | ICD-10-CM | POA: Diagnosis not present

## 2020-08-20 DIAGNOSIS — M9902 Segmental and somatic dysfunction of thoracic region: Secondary | ICD-10-CM | POA: Diagnosis not present

## 2020-08-20 DIAGNOSIS — Z85820 Personal history of malignant melanoma of skin: Secondary | ICD-10-CM | POA: Diagnosis not present

## 2020-08-20 DIAGNOSIS — C4371 Malignant melanoma of right lower limb, including hip: Secondary | ICD-10-CM | POA: Diagnosis not present

## 2020-08-20 DIAGNOSIS — Z85828 Personal history of other malignant neoplasm of skin: Secondary | ICD-10-CM | POA: Diagnosis not present

## 2020-08-20 DIAGNOSIS — M9905 Segmental and somatic dysfunction of pelvic region: Secondary | ICD-10-CM | POA: Diagnosis not present

## 2020-08-20 DIAGNOSIS — M9903 Segmental and somatic dysfunction of lumbar region: Secondary | ICD-10-CM | POA: Diagnosis not present

## 2020-08-20 DIAGNOSIS — L820 Inflamed seborrheic keratosis: Secondary | ICD-10-CM | POA: Diagnosis not present

## 2020-08-28 ENCOUNTER — Other Ambulatory Visit: Payer: Self-pay | Admitting: General Surgery

## 2020-08-28 DIAGNOSIS — C439 Malignant melanoma of skin, unspecified: Secondary | ICD-10-CM | POA: Diagnosis not present

## 2020-08-29 ENCOUNTER — Other Ambulatory Visit: Payer: Self-pay | Admitting: General Surgery

## 2020-08-29 ENCOUNTER — Other Ambulatory Visit (HOSPITAL_COMMUNITY): Payer: Self-pay | Admitting: General Surgery

## 2020-08-29 DIAGNOSIS — C439 Malignant melanoma of skin, unspecified: Secondary | ICD-10-CM

## 2020-08-30 ENCOUNTER — Telehealth: Payer: Self-pay | Admitting: Oncology

## 2020-08-30 NOTE — Telephone Encounter (Signed)
Claire Wall cld to confirm her new pt appt on 9/30 w/Dr. Alen Blew

## 2020-08-30 NOTE — Telephone Encounter (Signed)
Received a new pt referral from Dr. Barry Dienes for a new dx of melanoma. Ms. Schrom has been scheduled to see Dr. Alen Blew on 9/30 at 2pm. I was unable to reach the pt. Letter mailed.

## 2020-09-05 ENCOUNTER — Other Ambulatory Visit: Payer: Self-pay | Admitting: General Surgery

## 2020-09-05 ENCOUNTER — Other Ambulatory Visit (HOSPITAL_COMMUNITY): Payer: Self-pay | Admitting: General Surgery

## 2020-09-05 DIAGNOSIS — C439 Malignant melanoma of skin, unspecified: Secondary | ICD-10-CM

## 2020-09-11 ENCOUNTER — Other Ambulatory Visit: Payer: Self-pay

## 2020-09-11 ENCOUNTER — Ambulatory Visit (HOSPITAL_COMMUNITY)
Admission: RE | Admit: 2020-09-11 | Discharge: 2020-09-11 | Disposition: A | Discharge status: Home / Self Care | Payer: PPO | Source: Ambulatory Visit | Attending: General Surgery | Admitting: General Surgery

## 2020-09-11 DIAGNOSIS — I700 Atherosclerosis of aorta: Secondary | ICD-10-CM | POA: Insufficient documentation

## 2020-09-11 DIAGNOSIS — C439 Malignant melanoma of skin, unspecified: Secondary | ICD-10-CM | POA: Diagnosis not present

## 2020-09-11 DIAGNOSIS — K5730 Diverticulosis of large intestine without perforation or abscess without bleeding: Secondary | ICD-10-CM | POA: Diagnosis not present

## 2020-09-11 DIAGNOSIS — C4371 Malignant melanoma of right lower limb, including hip: Secondary | ICD-10-CM | POA: Diagnosis not present

## 2020-09-11 LAB — GLUCOSE, CAPILLARY: Glucose-Capillary: 85 mg/dL (ref 70–99)

## 2020-09-11 MED ORDER — FLUDEOXYGLUCOSE F - 18 (FDG) INJECTION
8.2000 | Freq: Once | INTRAVENOUS | Status: AC | PRN
  Administered 2020-09-11: 8.3 via INTRAVENOUS

## 2020-09-11 NOTE — Progress Notes (Signed)
[facility] [place], [address] [place] [address] Phone: [phone] Fax: [phone]      Your procedure is scheduled on September 28  Report to [facility] Main Entrance "A" at 0530 A.M., and check in at the Admitting office.  Call this number if you have problems the morning of surgery:  [phone]  Call [phone] if you have any questions prior to your surgery date Monday-Friday 8am-4pm    Remember:  Do not eat after midnight the night before your surgery  You may drink clear liquids until 0430 the morning of your surgery.   Clear liquids allowed are: Water, Non-Citrus Juices (without pulp), Carbonated Beverages, Clear Tea, Black Coffee Only, and Gatorade    Take these medicines the morning of surgery with A SIP OF WATER  simvastatin (ZOCOR)   As of today, STOP taking any Aspirin (unless otherwise instructed by your surgeon) Aleve, Naproxen, Ibuprofen, Motrin, Advil, Goody's, BC's, all herbal medications, fish oil, and all vitamins.meloxicam (MOBIC                      Do not wear jewelry, make up, or nail polish            Do not wear lotions, powders, perfumes, or deodorant.            Do not shave 48 hours prior to surgery.             Do not bring valuables to the hospital.            [facility] is not responsible for any belongings or valuables.  Do NOT Smoke (Tobacco/Vaping) or drink Alcohol 24 hours prior to your procedure If you use a CPAP at night, you may bring all equipment for your overnight stay.   Contacts, glasses, dentures or bridgework may not be worn into surgery.      For patients admitted to the hospital, discharge time will be determined by your treatment team.   Patients discharged the day of surgery will not be allowed to drive home, and someone needs to stay with them for 24 hours.    Special instructions:   [place]- Preparing For  Surgery  Before surgery, you can play an important role. Because skin is not sterile, your skin needs to be as free of germs as possible. You can reduce the number of germs on your skin by washing with CHG (chlorahexidine gluconate) Soap before surgery.  CHG is an antiseptic cleaner which kills germs and bonds with the skin to continue killing germs even after washing.    Oral Hygiene is also important to reduce your risk of infection.  Remember - BRUSH YOUR TEETH THE MORNING OF SURGERY WITH YOUR REGULAR TOOTHPASTE  Please do not use if you have an allergy to CHG or antibacterial soaps. If your skin becomes reddened/irritated stop using the CHG.  Do not shave (including legs and underarms) for at least 48 hours prior to first CHG shower. It is OK to shave your face.  Please follow these instructions carefully.   1. Shower the NIGHT BEFORE SURGERY and the MORNING OF SURGERY with CHG Soap.   2. If you chose to wash your hair, wash your hair first as usual with your normal shampoo.  3. After you shampoo, rinse your hair and body thoroughly to remove the shampoo.  4. Use CHG as you would  any other liquid soap. You can apply CHG directly to the skin and wash gently with a scrungie or a clean washcloth.   5. Apply the CHG Soap to your body ONLY FROM THE NECK DOWN.  Do not use on open wounds or open sores. Avoid contact with your eyes, ears, mouth and genitals (private parts). Wash Face and genitals (private parts)  with your normal soap.   6. Wash thoroughly, paying special attention to the area where your surgery will be performed.  7. Thoroughly rinse your body with warm water from the neck down.  8. DO NOT shower/wash with your normal soap after using and rinsing off the CHG Soap.  9. Pat yourself dry with a CLEAN TOWEL.  10. Wear CLEAN PAJAMAS to bed the night before surgery  11. Place CLEAN SHEETS on your bed the night of your first shower and DO NOT SLEEP WITH PETS.   Day of  Surgery: Wear Clean/Comfortable clothing the morning of surgery Do not apply any deodorants/lotions.   Remember to brush your teeth WITH YOUR REGULAR TOOTHPASTE.   Please read over the following fact sheets that you were given.

## 2020-09-12 ENCOUNTER — Encounter (HOSPITAL_COMMUNITY)
Admission: RE | Admit: 2020-09-12 | Discharge: 2020-09-12 | Disposition: A | Discharge status: Home / Self Care | Payer: PPO | Source: Ambulatory Visit | Attending: General Surgery | Admitting: General Surgery

## 2020-09-12 ENCOUNTER — Other Ambulatory Visit: Payer: Self-pay

## 2020-09-12 ENCOUNTER — Encounter (HOSPITAL_COMMUNITY): Payer: Self-pay

## 2020-09-12 DIAGNOSIS — Z01812 Encounter for preprocedural laboratory examination: Secondary | ICD-10-CM | POA: Insufficient documentation

## 2020-09-12 LAB — CBC
HCT: 42.4 % (ref 36.0–46.0)
Hemoglobin: 13.4 g/dL (ref 12.0–15.0)
MCH: 29.1 pg (ref 26.0–34.0)
MCHC: 31.6 g/dL (ref 30.0–36.0)
MCV: 92.2 fL (ref 80.0–100.0)
Platelets: 235 10*3/uL (ref 150–400)
RBC: 4.6 MIL/uL (ref 3.87–5.11)
RDW: 13.3 % (ref 11.5–15.5)
WBC: 5.5 10*3/uL (ref 4.0–10.5)
nRBC: 0 % (ref 0.0–0.2)

## 2020-09-12 NOTE — Progress Notes (Signed)
PCP - Dr. Hulan Fess Cardiologist - Denies  Chest x-ray - Not indicated  EKG - 10/09/20 Stress Test - Yes "I had one done a number of years ago" ECHO - 2014 Cardiac Cath - Denies  Sleep Study - Denies  DM - Denies  ERAS Protcol - Yes  COVID TEST- 09/14/20  Anesthesia review: No  Patient denies shortness of breath, fever, cough and chest pain at PAT appointment   All instructions explained to the patient, with a verbal understanding of the material. Patient agrees to go over the instructions while at home for a better understanding. Patient also instructed to self quarantine after being tested for COVID-19. The opportunity to ask questions was provided.

## 2020-09-13 DIAGNOSIS — M9902 Segmental and somatic dysfunction of thoracic region: Secondary | ICD-10-CM | POA: Diagnosis not present

## 2020-09-13 DIAGNOSIS — M9905 Segmental and somatic dysfunction of pelvic region: Secondary | ICD-10-CM | POA: Diagnosis not present

## 2020-09-13 DIAGNOSIS — M9903 Segmental and somatic dysfunction of lumbar region: Secondary | ICD-10-CM | POA: Diagnosis not present

## 2020-09-13 DIAGNOSIS — M791 Myalgia, unspecified site: Secondary | ICD-10-CM | POA: Diagnosis not present

## 2020-09-14 ENCOUNTER — Other Ambulatory Visit (HOSPITAL_COMMUNITY)
Admission: RE | Admit: 2020-09-14 | Discharge: 2020-09-14 | Disposition: A | Discharge status: Home / Self Care | Payer: PPO | Source: Ambulatory Visit | Attending: General Surgery | Admitting: General Surgery

## 2020-09-14 DIAGNOSIS — Z79899 Other long term (current) drug therapy: Secondary | ICD-10-CM | POA: Diagnosis not present

## 2020-09-14 DIAGNOSIS — I10 Essential (primary) hypertension: Secondary | ICD-10-CM | POA: Diagnosis not present

## 2020-09-14 DIAGNOSIS — Z885 Allergy status to narcotic agent status: Secondary | ICD-10-CM | POA: Diagnosis not present

## 2020-09-14 DIAGNOSIS — M1990 Unspecified osteoarthritis, unspecified site: Secondary | ICD-10-CM | POA: Diagnosis not present

## 2020-09-14 DIAGNOSIS — C4371 Malignant melanoma of right lower limb, including hip: Secondary | ICD-10-CM | POA: Diagnosis not present

## 2020-09-14 DIAGNOSIS — Z20822 Contact with and (suspected) exposure to covid-19: Secondary | ICD-10-CM | POA: Diagnosis not present

## 2020-09-14 LAB — SARS CORONAVIRUS 2 (TAT 6-24 HRS): SARS Coronavirus 2: NEGATIVE

## 2020-09-17 NOTE — H&P (Signed)
Claire Wall Appointment: 08/28/2020 11:30 AM Location: [facility] Patient #: [id number] DOB: 1951/11/13 Married / Language: Cleophus Molt / Race: White Female   History of Present Illness Stark Klein MD; 08/28/2020 12:48 PM) The patient is a 69 year old female who presents for a follow-up for Malignant melanoma. Pt is a 69 yo F who was referred by Dr. Jarome Matin for dx of melanoma right calf 09/19/2019. She noted a mole there in late june. It started to change and she was going to have it looked at in July, but had a family emergency and had to move her appointment. By the time she saw Dr. Ronnald Ramp, the lesion had become more raised and it had multiple colors in it (black, brown, red). It also itched a little. Dr. Ronnald Ramp biopsied it and it was seen to be a superficial spreading melanoma 1.375 mm. Clarks IV. peripheral margins positive for MIS. deep margins positive for invasive melanoma. Mitoses were 8/mm2. No LVI or neurotropism was seen. No satellitosis was seen. TIL were present. She has no other cancer history other than squamous cell cancer. She is up to date with colonoscopy and mammogram. She has no family history of cancer.   She had WLE with advancement flap closure 10/13/2019. Nodes and margins were negative. She had a right groin seroma that required multiple aspirations and a wound infection of WLE site. The seroma resolved after placement of a seroma catheter. She had prolonged delay of wound healing. we were planning to do debridement and a-cell, but she finally had wound closure.   She had a negative skin check in July, but noted a hard raised spot near the previous scar several weeks later. She said this was almost blue appearing. Dr. Ronnald Ramp did punch biopsy and this was positive for recurrent melanoma. She is here to discuss. She had no other unusual areas near that. She had a spot frozen above her right knee.   path is from [facility]. accession  [phone] malignant melanoma involving dermis. There is mitotic activity. No intraepidermal melanoma is seen.    Path 10/13/19 A. SKIN, RIGHT CALF, EXCISION: EXCISION, RESIDUAL MELANOMA IN SITU, NO RESIDUAL INVASIVE DISEASE; MARGINS FREE OF MELANOMA (TEMPLATE REPEATED FROM [id number] WITH STAGING FOR COMPLETENESS) MALIGNANT MELANOMA MELANOMA TABLE (AJCC 8TH EDITION*) PROCEDURE: EXCISION SPECIMEN ANATOMIC SITE: RIGHT CALF HISTOLOGIC TYPE: SUPERFICIAL SPREADING BRESLOW'S DEPTH/MAXIMUM TUMOR THICKNESS: 1.4 MM CLARK/ANATOMIC LEVEL: IV MARGINS PERIPHERAL MARGINS: FREE DEEP MARGIN: FREE ULCERATION: ABSENT SATELLITOSIS: ABSENT MITOTIC INDEX: 8/MM2 LYMPHO-VASCULAR INVASION: ABSENT NEUROTROPISM: ABSENT TUMOR-INFILTRATING LYMPHOCYTES: PRESENT, NON-BRISK TUMOR REGRESSION: ABSENT LYMPH NODES (IF APPLICABLE): N/A PATHOLOGIC STAGE: PT2A N0 MX COMMENT: THE EXCISIONAL SPECIMEN IS EXAMINED BY H AND E, HMB45 AND MELANA. THE MARGINS ARE FREE OF MALIGNANCY. * AJCC 8TH EDITION: PT1A <0.8 W/O ULCER; PT1B <0.8 W/ ULCER OR 0.8-1.0 WITH OR W/O ULCER; PT2A >1.0-2.0 W/OUT ULCER; PT2B >1.0-2.0 W/ ULCER; PT3A >2.0-4.0 W/O ULCER; PT3B >2.0-4.0 W/ ULCER; PT4A >4.0 W/O ULCER; PT4B: >4.0 W/ ULCER. (AJCC MELANOMA EXPERT PANEL: [place] [facility].2017 OCT 13)  B. SENTINEL LYMPH NODE, RIGHT INGUINAL #1, EXCISION: 0/1 SENTINEL LYMPH NODE, NEGATIVE FOR MELANOMA  C. SENTINEL LYMPH NODE, RIGHT INGUINAL #2, EXCISION: 0/1 SENTINEL LYMPH NODE, NEGATIVE FOR MELANOMA COMMENT: SENTINEL LYMPH NODES WERE EXAMINED BY H AND E, HMB-45, MELANA, AND SOX-10 AND ARE NEGATIVE. TOTAL: 0/2 SENTINEL LYMPH NODES, NEGATIVE FOR MELANOMA.   Allergies Darden Palmer, [place]; 08/28/2020 11:49 AM) Codeine/Codeine Derivatives  Allergies Reconciled   Medication History Darden Palmer, [place]; 08/28/2020 11:49  AM) Meloxicam (7.5MG  Tablet, Oral) Active. Simvastatin (20MG  Tablet, Oral) Active. Vital-D Rx (1MG  Tablet, Oral) Active. Fish  Oil (Oral) Specific strength unknown - Active. CoQ-10 (200MG  Capsule, Oral) Active. Vital 1.0 Cal (Oral) Active. Cholecalciferol (5000UNIT/0.5ML Liquid, Oral) Active. Fish Oil + D3 (1000-1000MG -UNIT Capsule, 1750 Oral) Active. Medications Reconciled    Review of Systems Stark Klein MD; 08/28/2020 12:47 PM) All other systems negative  Vitals Lattie Haw Caldwell RMA; 08/28/2020 11:50 AM) 08/28/2020 11:49 AM Weight: 171.38 lb Height: 70in Body Surface Area: 1.95 m Body Mass Index: 24.59 kg/m  Temp.: 97.97F  Pulse: 72 (Regular)  P.OX: 91% (Room air) BP: 120/68(Sitting, Left Arm, Standard)       Physical Exam Stark Klein MD; 08/28/2020 12:48 PM) General Mental Status-Alert. General Appearance-Consistent with stated age. Hydration-Well hydrated. Voice-Normal.  Integumentary Note: right calf with punch biopsy site around 1.5 cm anterior to the lower third of the scar. no erythema. no other palpable lesions.   Head and Neck Head-normocephalic, atraumatic with no lesions or palpable masses.  Eye Sclera/Conjunctiva - Bilateral-No scleral icterus.  Chest and Lung Exam Chest and lung exam reveals -quiet, even and easy respiratory effort with no use of accessory muscles. Inspection Chest Wall - Normal. Back - normal.  Breast - Did not examine.  Cardiovascular Cardiovascular examination reveals -normal pedal pulses bilaterally. Note: regular rate and rhythm  Abdomen Inspection-Inspection Normal. Palpation/Percussion Palpation and Percussion of the abdomen reveal - Soft, Non Tender, No Rebound tenderness, No Rigidity (guarding) and No hepatosplenomegaly.  Peripheral Vascular Upper Extremity Inspection - Bilateral - Normal - No Clubbing, No Cyanosis, No Edema, Pulses Intact. Lower Extremity Palpation - Edema - Bilateral - No edema - Bilateral.  Neurologic Neurologic evaluation reveals -alert and oriented x 3 with no impairment of recent  or remote memory. Mental Status-Normal.  Musculoskeletal Global Assessment -Note: no gross deformities.  Normal Exam - Left-Upper Extremity Strength Normal and Lower Extremity Strength Normal. Normal Exam - Right-Upper Extremity Strength Normal and Lower Extremity Strength Normal.  Lymphatic Head & Neck  General Head & Neck Lymphatics: Bilateral - Description - Normal. Axillary  General Axillary Region: Bilateral - Description - Normal. Tenderness - Non Tender.    Assessment & Plan Stark Klein MD; 08/28/2020 12:49 PM) RECURRENT SKIN MELANOMA (C43.9) Impression: I would plan WLE on this site. It appears to be in transit metastasis vs satellite lesion. Given that this is not a new primary, I would not repeat sentinel node biopsy. I would plan PET to evaluate any other recurrent sites.  I will also plan to refer to oncology. I discussed that this area is at risk for non healing as before.  I discussed post op restrictions. Current Plans WHOLE BODY PET METABOLIC STUDY FOR MELANOMA (20254) (recurrent melanoma in under 1 year.) Referred to Oncology, for evaluation and follow up (Oncology). Routine. You are being scheduled for surgery- Our schedulers will call you.  You should hear from our office's scheduling department within 5 working days about the location, date, and time of surgery. We try to make accommodations for patient's preferences in scheduling surgery, but sometimes the OR schedule or the surgeon's schedule prevents [place] from making those accommodations.  If you have not heard from our office [phone]) in 5 working days, call the office and ask for your surgeon's nurse.  If you have other questions about your diagnosis, plan, or surgery, call the office and ask for your surgeon's nurse.    Signed electronically by Stark Klein, MD (08/28/2020 12:50 PM)

## 2020-09-17 NOTE — Anesthesia Preprocedure Evaluation (Addendum)
Anesthesia Evaluation  Patient identified by MRN, date of birth, ID band Patient awake    Reviewed: Allergy & Precautions, H&P , NPO status , Patient's Chart, lab work & pertinent test results  Airway Mallampati: II  TM Distance: >3 FB Neck ROM: Full    Dental no notable dental hx. (+) Teeth Intact, Dental Advisory Given   Pulmonary neg pulmonary ROS,    Pulmonary exam normal breath sounds clear to auscultation       Cardiovascular Exercise Tolerance: Good hypertension, Normal cardiovascular exam Rhythm:Regular Rate:Normal  ECHO 4/15 LV size was normal.  - LV global systolic function was normal. The estimated LV  ejection fraction was 60%.  - Normal wall motion; no LV regional wall motion  abnormalities.    Neuro/Psych negative neurological ROS  negative psych ROS   GI/Hepatic negative GI ROS, Neg liver ROS,   Endo/Other  negative endocrine ROS  Renal/GU negative Renal ROS  negative genitourinary   Musculoskeletal negative musculoskeletal ROS (+) Arthritis , Osteoarthritis,    Abdominal   Peds negative pediatric ROS (+)  Hematology negative hematology ROS (+)   Anesthesia Other Findings   Reproductive/Obstetrics negative OB ROS                            Anesthesia Physical  Anesthesia Plan  ASA: II  Anesthesia Plan: MAC   Post-op Pain Management:    Induction: Intravenous  PONV Risk Score and Plan: 3 and Dexamethasone, Ondansetron and Treatment may vary due to age or medical condition  Airway Management Planned: Natural Airway and Nasal Cannula  Additional Equipment:   Intra-op Plan:   Post-operative Plan: Extubation in OR  Informed Consent: I have reviewed the patients History and Physical, chart, labs and discussed the procedure including the risks, benefits and alternatives for the proposed anesthesia with the patient or authorized representative who has indicated  his/her understanding and acceptance.       Plan Discussed with: CRNA, Anesthesiologist and Surgeon  Anesthesia Plan Comments: (  )        Anesthesia Quick Evaluation

## 2020-09-18 ENCOUNTER — Other Ambulatory Visit: Payer: Self-pay

## 2020-09-18 ENCOUNTER — Ambulatory Visit (HOSPITAL_COMMUNITY): Payer: PPO | Admitting: Anesthesiology

## 2020-09-18 ENCOUNTER — Ambulatory Visit (HOSPITAL_COMMUNITY)
Admission: RE | Admit: 2020-09-18 | Discharge: 2020-09-18 | Disposition: A | Discharge status: Home / Self Care | Payer: PPO | Attending: General Surgery | Admitting: General Surgery

## 2020-09-18 ENCOUNTER — Encounter (HOSPITAL_COMMUNITY): Payer: Self-pay | Admitting: General Surgery

## 2020-09-18 ENCOUNTER — Encounter (HOSPITAL_COMMUNITY)
Admission: RE | Disposition: A | Discharge status: Home / Self Care | Payer: Self-pay | Source: Home / Self Care | Attending: General Surgery

## 2020-09-18 DIAGNOSIS — Z20822 Contact with and (suspected) exposure to covid-19: Secondary | ICD-10-CM | POA: Insufficient documentation

## 2020-09-18 DIAGNOSIS — Z885 Allergy status to narcotic agent status: Secondary | ICD-10-CM | POA: Diagnosis not present

## 2020-09-18 DIAGNOSIS — C4371 Malignant melanoma of right lower limb, including hip: Secondary | ICD-10-CM | POA: Insufficient documentation

## 2020-09-18 DIAGNOSIS — M1990 Unspecified osteoarthritis, unspecified site: Secondary | ICD-10-CM | POA: Insufficient documentation

## 2020-09-18 DIAGNOSIS — I10 Essential (primary) hypertension: Secondary | ICD-10-CM | POA: Diagnosis not present

## 2020-09-18 DIAGNOSIS — Z79899 Other long term (current) drug therapy: Secondary | ICD-10-CM | POA: Insufficient documentation

## 2020-09-18 HISTORY — PX: MELANOMA EXCISION: SHX5266

## 2020-09-18 SURGERY — EXCISION, MELANOMA
Anesthesia: Monitor Anesthesia Care | Site: Leg Lower | Laterality: Right

## 2020-09-18 MED ORDER — ORAL CARE MOUTH RINSE: 15.0000 mL | Freq: Once | OROMUCOSAL | Status: AC

## 2020-09-18 MED ORDER — PROPOFOL 500 MG/50ML IV EMUL
INTRAVENOUS | Status: DC | PRN
  Administered 2020-09-18: 50 ug/kg/min via INTRAVENOUS

## 2020-09-18 MED ORDER — MEPERIDINE HCL 25 MG/ML IJ SOLN: 6.2500 mg | INTRAMUSCULAR | Status: DC | PRN

## 2020-09-18 MED ORDER — ONDANSETRON HCL 4 MG/2ML IJ SOLN
INTRAMUSCULAR | Status: DC | PRN
  Administered 2020-09-18: 4 mg via INTRAVENOUS

## 2020-09-18 MED ORDER — ACETAMINOPHEN 500 MG PO TABS
1000.0000 mg | ORAL_TABLET | ORAL | Status: AC
  Administered 2020-09-18: 1000 mg via ORAL
  Filled 2020-09-18: qty 2

## 2020-09-18 MED ORDER — CHLORHEXIDINE GLUCONATE 0.12 % MT SOLN
15.0000 mL | Freq: Once | OROMUCOSAL | Status: AC
  Administered 2020-09-18: 15 mL via OROMUCOSAL
  Filled 2020-09-18: qty 15

## 2020-09-18 MED ORDER — CHLORHEXIDINE GLUCONATE CLOTH 2 % EX PADS: 6.0000 | MEDICATED_PAD | Freq: Once | CUTANEOUS | Status: DC

## 2020-09-18 MED ORDER — FENTANYL CITRATE (PF) 100 MCG/2ML IJ SOLN: 25.0000 ug | INTRAMUSCULAR | Status: DC | PRN

## 2020-09-18 MED ORDER — OXYCODONE HCL 5 MG/5ML PO SOLN: 5.0000 mg | Freq: Once | ORAL | Status: AC | PRN

## 2020-09-18 MED ORDER — PROPOFOL 10 MG/ML IV BOLUS
INTRAVENOUS | Status: DC | PRN
  Administered 2020-09-18: 10 mg via INTRAVENOUS

## 2020-09-18 MED ORDER — ACETAMINOPHEN 325 MG PO TABS: 325.0000 mg | ORAL_TABLET | ORAL | Status: DC | PRN

## 2020-09-18 MED ORDER — PHENYLEPHRINE 40 MCG/ML (10ML) SYRINGE FOR IV PUSH (FOR BLOOD PRESSURE SUPPORT)
PREFILLED_SYRINGE | INTRAVENOUS | Status: DC | PRN
  Administered 2020-09-18 (×4): 80 ug via INTRAVENOUS

## 2020-09-18 MED ORDER — CEFAZOLIN SODIUM-DEXTROSE 2-4 GM/100ML-% IV SOLN
2.0000 g | INTRAVENOUS | Status: AC
  Administered 2020-09-18: 2 g via INTRAVENOUS
  Filled 2020-09-18: qty 100

## 2020-09-18 MED ORDER — ACETAMINOPHEN 160 MG/5ML PO SOLN: 325.0000 mg | ORAL | Status: DC | PRN

## 2020-09-18 MED ORDER — LIDOCAINE-EPINEPHRINE 1 %-1:100000 IJ SOLN
INTRAMUSCULAR | Status: DC | PRN
  Administered 2020-09-18: 7 mL

## 2020-09-18 MED ORDER — MIDAZOLAM HCL 5 MG/5ML IJ SOLN
INTRAMUSCULAR | Status: DC | PRN
  Administered 2020-09-18: 2 mg via INTRAVENOUS

## 2020-09-18 MED ORDER — MIDAZOLAM HCL 2 MG/2ML IJ SOLN
INTRAMUSCULAR | Status: AC
  Filled 2020-09-18: qty 2

## 2020-09-18 MED ORDER — BUPIVACAINE HCL (PF) 0.25 % IJ SOLN
INTRAMUSCULAR | Status: DC | PRN
  Administered 2020-09-18: 7 mL

## 2020-09-18 MED ORDER — OXYCODONE HCL 5 MG PO TABS: 5.0000 mg | ORAL_TABLET | Freq: Four times a day (QID) | ORAL | 0 refills | Status: DC | PRN

## 2020-09-18 MED ORDER — PROPOFOL 10 MG/ML IV BOLUS
INTRAVENOUS | Status: AC
  Filled 2020-09-18: qty 20

## 2020-09-18 MED ORDER — BUPIVACAINE HCL (PF) 0.25 % IJ SOLN
INTRAMUSCULAR | Status: AC
  Filled 2020-09-18: qty 30

## 2020-09-18 MED ORDER — DEXAMETHASONE SODIUM PHOSPHATE 10 MG/ML IJ SOLN
INTRAMUSCULAR | Status: DC | PRN
  Administered 2020-09-18: 4 mg via INTRAVENOUS

## 2020-09-18 MED ORDER — OXYCODONE HCL 5 MG PO TABS
5.0000 mg | ORAL_TABLET | Freq: Once | ORAL | Status: AC | PRN
  Administered 2020-09-18: 5 mg via ORAL

## 2020-09-18 MED ORDER — OXYCODONE HCL 5 MG PO TABS
ORAL_TABLET | ORAL | Status: AC
  Filled 2020-09-18: qty 1

## 2020-09-18 MED ORDER — ONDANSETRON HCL 4 MG/2ML IJ SOLN: 4.0000 mg | Freq: Once | INTRAMUSCULAR | Status: DC | PRN

## 2020-09-18 MED ORDER — LACTATED RINGERS IV SOLN: INTRAVENOUS | Status: DC

## 2020-09-18 MED ORDER — 0.9 % SODIUM CHLORIDE (POUR BTL) OPTIME
TOPICAL | Status: DC | PRN
  Administered 2020-09-18: 1000 mL

## 2020-09-18 MED ORDER — FENTANYL CITRATE (PF) 250 MCG/5ML IJ SOLN
INTRAMUSCULAR | Status: AC
  Filled 2020-09-18: qty 5

## 2020-09-18 MED ORDER — FENTANYL CITRATE (PF) 250 MCG/5ML IJ SOLN
INTRAMUSCULAR | Status: DC | PRN
Start: 2020-09-18 — End: 2020-09-18
  Administered 2020-09-18: 50 ug via INTRAVENOUS
  Administered 2020-09-18: 25 ug via INTRAVENOUS

## 2020-09-18 MED ORDER — LIDOCAINE-EPINEPHRINE 1 %-1:100000 IJ SOLN
INTRAMUSCULAR | Status: AC
  Filled 2020-09-18: qty 1

## 2020-09-18 SURGICAL SUPPLY — 43 items
ADH SKN CLS APL DERMABOND .7 (GAUZE/BANDAGES/DRESSINGS) ×1
APL PRP STRL LF DISP 70% ISPRP (MISCELLANEOUS) ×1
APL SKNCLS STERI-STRIP NONHPOA (GAUZE/BANDAGES/DRESSINGS) ×1
BENZOIN TINCTURE PRP APPL 2/3 (GAUZE/BANDAGES/DRESSINGS) ×2 IMPLANT
BNDG ELASTIC 4X5.8 VLCR STR LF (GAUZE/BANDAGES/DRESSINGS) ×2 IMPLANT
CANISTER SUCT 3000ML PPV (MISCELLANEOUS) ×3 IMPLANT
CHLORAPREP W/TINT 26 (MISCELLANEOUS) ×3 IMPLANT
CLOSURE WOUND 1/2 X4 (GAUZE/BANDAGES/DRESSINGS) ×1
COVER SURGICAL LIGHT HANDLE (MISCELLANEOUS) ×3 IMPLANT
COVER WAND RF STERILE (DRAPES) ×1 IMPLANT
DERMABOND ADVANCED (GAUZE/BANDAGES/DRESSINGS) ×2
DERMABOND ADVANCED .7 DNX12 (GAUZE/BANDAGES/DRESSINGS) IMPLANT
DRAPE EXTREMITY T 121X128X90 (DISPOSABLE) ×2 IMPLANT
DRAPE LAPAROTOMY 100X72 PEDS (DRAPES) IMPLANT
ELECT REM PT RETURN 9FT ADLT (ELECTROSURGICAL) ×3
ELECTRODE REM PT RTRN 9FT ADLT (ELECTROSURGICAL) ×1 IMPLANT
GAUZE 4X4 16PLY RFD (DISPOSABLE) ×1 IMPLANT
GAUZE SPONGE 4X4 12PLY STRL (GAUZE/BANDAGES/DRESSINGS) ×2 IMPLANT
GLOVE BIO SURGEON STRL SZ 6 (GLOVE) ×3 IMPLANT
GLOVE INDICATOR 6.5 STRL GRN (GLOVE) ×3 IMPLANT
GOWN STRL REUS W/ TWL LRG LVL3 (GOWN DISPOSABLE) ×1 IMPLANT
GOWN STRL REUS W/TWL 2XL LVL3 (GOWN DISPOSABLE) ×3 IMPLANT
GOWN STRL REUS W/TWL LRG LVL3 (GOWN DISPOSABLE) ×6
KIT BASIN OR (CUSTOM PROCEDURE TRAY) ×3 IMPLANT
KIT TURNOVER KIT B (KITS) ×3 IMPLANT
MARKER SKIN DUAL TIP RULER LAB (MISCELLANEOUS) ×3 IMPLANT
NDL HYPO 25GX1X1/2 BEV (NEEDLE) ×2 IMPLANT
NEEDLE HYPO 25GX1X1/2 BEV (NEEDLE) ×3 IMPLANT
NS IRRIG 1000ML POUR BTL (IV SOLUTION) ×3 IMPLANT
PACK GENERAL/GYN (CUSTOM PROCEDURE TRAY) ×3 IMPLANT
PAD ARMBOARD 7.5X6 YLW CONV (MISCELLANEOUS) ×6 IMPLANT
PENCIL SMOKE EVACUATOR (MISCELLANEOUS) ×3 IMPLANT
SPECIMEN JAR SMALL (MISCELLANEOUS) ×3 IMPLANT
STRIP CLOSURE SKIN 1/2X4 (GAUZE/BANDAGES/DRESSINGS) ×1 IMPLANT
SUT ETHILON 2 0 FS 18 (SUTURE) ×5 IMPLANT
SUT MNCRL AB 4-0 PS2 18 (SUTURE) ×3 IMPLANT
SUT SILK 2 0 PERMA HAND 18 BK (SUTURE) ×2 IMPLANT
SUT VIC AB 2-0 SH 27 (SUTURE) ×3
SUT VIC AB 2-0 SH 27XBRD (SUTURE) ×1 IMPLANT
SUT VIC AB 3-0 SH 27 (SUTURE) ×3
SUT VIC AB 3-0 SH 27X BRD (SUTURE) ×1 IMPLANT
SYR CONTROL 10ML LL (SYRINGE) ×6 IMPLANT
TOWEL GREEN STERILE FF (TOWEL DISPOSABLE) ×3 IMPLANT

## 2020-09-18 NOTE — Op Note (Signed)
PRE-OPERATIVE DIAGNOSIS: recurrent melanoma vs satellite lesion right posterior calf  POST-OPERATIVE DIAGNOSIS:  Same  PROCEDURE:  Procedure(s): Wide local excision 1 cm margins, advancement flap closure for defect 2.5 cm x 4.5 cm  SURGEON:  Surgeon(s): Stark Klein, MD  ANESTHESIA:  MAC and local  DRAINS: none   LOCAL MEDICATIONS USED:  MARCAINE    and XYLOCAINE   SPECIMEN:  Source of Specimen:  wide local excision right posterior calf melanoma   FINDINGS:  Scar vs residual blue/black lesion  DISPOSITION OF SPECIMEN:  PATHOLOGY  COUNTS:  YES  PLAN OF CARE: Discharge to home after PACU  PATIENT DISPOSITION:  PACU - hemodynamically stable.    PROCEDURE:   Pt was identified in the holding area, taken to the OR, and placed supine on the OR table.  MAC anesthesia was induced.  Time out was performed according to the surgical safety checklist.  When all was correct, we continued.    The patient was placed into the frogleg position.  The right lower leg was prepped and draped in sterile fashion.  The melanoma was identified and 1 cm margins were marked out.  Local was administered under the melanoma and the adjacent tissue.  A #10 blade was used to incise the skin around the melanoma.  The cautery was used to take the dissection down to the fascia.  The skin was marked in situ with orientation sutures.  The cautery was used to take the specimen off the fascia, and it was passed off the table.    Skin hooks were used to elevate the edges of the incision and the skin was freed up in all directions to create advancement flaps.  This was pulled together in an longitudinal orientation. The skin was pulled together to check the tension. . Deep interrupted 2-0 vicryl sutures were placed to relieve tension.  The skin was then reapproximated with 3-0 interrupted vicryl deep dermal sutures.  Five 2-0 nylon horizontal mattress sutures were placed as well.    The melanoma site was cleaned, dried,  and dressed with Benzoin, steristrips, gauze, and coban  Needle, sponge, and instrument counts were correct.  The patient was awakened from anesthesia and taken to the PACU in stable condition.

## 2020-09-18 NOTE — Discharge Instructions (Addendum)
[place] Office Phone Number [phone]   POST OP INSTRUCTIONS  Always review your discharge instruction sheet given to you by the facility where your surgery was performed.  IF YOU HAVE DISABILITY OR FAMILY LEAVE FORMS, YOU MUST BRING THEM TO THE OFFICE FOR PROCESSING.  DO NOT GIVE THEM TO YOUR DOCTOR.  1. A prescription for pain medication may be given to you upon discharge.  Take your pain medication as prescribed, if needed.  If narcotic pain medicine is not needed, then you may take acetaminophen (Tylenol) or ibuprofen (Advil) as needed. 2. Take your usually prescribed medications unless otherwise directed 3. If you need a refill on your pain medication, please contact your pharmacy.  They will contact our office to request authorization.  Prescriptions will not be filled after 5pm or on week-ends. 4. You should eat very light the first 24 hours after surgery, such as soup, crackers, pudding, etc.  Resume your normal diet the day after surgery 5. It is common to experience some constipation if taking pain medication after surgery.  Increasing fluid intake and taking a stool softener will usually help or prevent this problem from occurring.  A mild laxative (Milk of Magnesia or Miralax) should be taken according to package directions if there are no bowel movements after 48 hours. 6. You may remove the outer dressing in 48 hours and then it is Ok to shower.  You can leave the wound open if you aren't wearing long pants.   7. ACTIVITIES:  No strenuous activity or heavy lifting for 2 week.   a. You may drive when you no longer are taking prescription pain medication, you can comfortably wear a seatbelt, and you can safely maneuver your car and apply brakes. b. RETURN TO WORK:  __________as tolerated if no lifting or strenuous activity for 2 weeks._______________ Claire Wall should see your doctor in the office for a follow-up appointment approximately three-four weeks after your  surgery.    WHEN TO CALL YOUR DOCTOR: 1. Fever over 101.0 2. Nausea and/or vomiting. 3. Extreme swelling or bruising. 4. Continued bleeding from incision. 5. Increased pain, redness, or drainage from the incision.  The clinic staff is available to answer your questions during regular business hours.  Please don't hesitate to call and ask to speak to one of the nurses for clinical concerns.  If you have a medical emergency, go to the nearest emergency room or call 911.  A surgeon from [facility] is always on call at the hospital.  For further questions, please visit [email or web address]

## 2020-09-18 NOTE — Interval H&P Note (Signed)
History and Physical Interval Note:  09/18/2020 7:32 AM  Claire Wall  has presented today for surgery, with the diagnosis of recurrent melanoma.  The various methods of treatment have been discussed with the patient and family. After consideration of risks, benefits and other options for treatment, the patient has consented to  Procedure(s): WIDE LOCAL EXICISION RIGHT LOWER LEG MELANOMA WITH ADVANCEMENT FLAP CLOSURE (Right) as a surgical intervention.  The patient's history has been reviewed, patient examined, no change in status, stable for surgery.  I have reviewed the patient's chart and labs.  Questions were answered to the patient's satisfaction.     Stark Klein

## 2020-09-18 NOTE — Transfer of Care (Signed)
Immediate Anesthesia Transfer of Care Note  Patient: Claire Wall  Procedure(s) Performed: WIDE LOCAL EXICISION WITH ADVANCEMENT FLAP CLOSURE RIGHT CALF MELANOMA (Right Leg Lower)  Patient Location: PACU  Anesthesia Type:MAC  Level of Consciousness: awake, alert  and oriented  Airway & Oxygen Therapy: Patient Spontanous Breathing and Patient connected to face mask oxygen  Post-op Assessment: Report given to RN and Post -op Vital signs reviewed and stable  Post vital signs: Reviewed and stable  Last Vitals:  Vitals Value Taken Time  BP 122/73   Temp    Pulse 56 09/18/20 0830  Resp 13 09/18/20 0830  SpO2 100 % 09/18/20 0830  Vitals shown include unvalidated device data.  Last Pain:  Vitals:   09/18/20 0605  TempSrc:   PainSc: 0-No pain      Patients Stated Pain Goal: 5 (40/81/44 8185)  Complications: No complications documented.

## 2020-09-19 ENCOUNTER — Encounter (HOSPITAL_COMMUNITY): Payer: Self-pay | Admitting: General Surgery

## 2020-09-19 NOTE — Anesthesia Postprocedure Evaluation (Signed)
Anesthesia Post Note  Patient: Claire Wall  Procedure(s) Performed: WIDE LOCAL EXICISION WITH ADVANCEMENT FLAP CLOSURE RIGHT CALF MELANOMA (Right Leg Lower)     Patient location during evaluation: PACU Anesthesia Type: MAC Level of consciousness: awake and alert Pain management: pain level controlled Vital Signs Assessment: post-procedure vital signs reviewed and stable Respiratory status: spontaneous breathing, nonlabored ventilation, respiratory function stable and patient connected to nasal cannula oxygen Cardiovascular status: stable and blood pressure returned to baseline Postop Assessment: no apparent nausea or vomiting Anesthetic complications: no   No complications documented.  Last Vitals:  Vitals:   09/18/20 0900 09/18/20 0911  BP: (!) 145/66 (!) 141/68  Pulse: (!) 54 (!) 58  Resp: 13 17  Temp: 36.5 C   SpO2: 100% 95%    Last Pain:  Vitals:   09/18/20 0900  TempSrc:   PainSc: 0-No pain                 Tatjana Turcott

## 2020-09-20 ENCOUNTER — Inpatient Hospital Stay: Payer: PPO | Attending: Oncology | Admitting: Oncology

## 2020-09-20 ENCOUNTER — Other Ambulatory Visit: Payer: Self-pay

## 2020-09-20 VITALS — BP 123/63 | HR 68 | Temp 97.0°F | Resp 18 | Ht 69.0 in | Wt 173.0 lb

## 2020-09-20 DIAGNOSIS — C439 Malignant melanoma of skin, unspecified: Secondary | ICD-10-CM

## 2020-09-20 NOTE — Progress Notes (Signed)
Reason for the request:    Melanoma  HPI: I was asked by Dr. Barry Dienes to evaluate Claire Wall for the diagnosis of melanoma.  She is a 69 year old woman diagnosed with melanoma on her calf in Sep 2020.  She noted a mole on her right calf and it was biopsied by Dr. Ronnald Ramp and found to have superficial spreading melanoma measuring 1.375 mm with positive margins.  She underwent wide excision and sentinel lymph node sampling completed by Dr. Barry Dienes on Oct 13, 2019.  The final pathology showed 1.4 mm superficial spreading melanoma with negative margins.  No ulceration or lymphovascular invasion noted.  Sentinel lymph node sampling was negative as well with the pathological staging of T2 a N0.  She subsequently developed recurrence around the incision site that was biopsy-proven to be recurrent melanoma.  She underwent repeat excision on Sep 18, 2020 by Dr. Barry Dienes.  The final pathology is currently pending.  She completed staging work-up including a PET scan without any evidence of metastatic disease.  She does not report any headaches, blurry vision, syncope or seizures. Does not report any fevers, chills or sweats.  Does not report any cough, wheezing or hemoptysis.  Does not report any chest pain, palpitation, orthopnea or leg edema.  Does not report any nausea, vomiting or abdominal pain.  Does not report any constipation or diarrhea.  Does not report any skeletal complaints.    Does not report frequency, urgency or hematuria.  Does not report any skin rashes or lesions. Does not report any heat or cold intolerance.  Does not report any lymphadenopathy or petechiae.  Does not report any anxiety or depression.  Remaining review of systems is negative.    Past Medical History:  Diagnosis Date  . Arthritis    knees, shoulder  . Cancer ([place]) 08/2019   skin - melanoma right calf  . Chest pain 2015   Resolved per patient - anxiety/stress related to work, now retired, no current problem   . Hyperlipidemia   .  Hypertension    resolved per patient, no problems since patient retired, had a stressful job.  . Pneumonia    x 1  :  Past Surgical History:  Procedure Laterality Date  . APPENDECTOMY    . bladder tack  2018   at [facility] Left   . COLONOSCOPY     polyps  . ELBOW SURGERY Bilateral    x 2  . FOOT SURGERY Left    bunion  . MELANOMA EXCISION Right 09/18/2020   Procedure: WIDE LOCAL EXICISION WITH ADVANCEMENT FLAP CLOSURE RIGHT CALF MELANOMA;  Surgeon: Stark Klein, MD;  Location: [facility];  Service: General;  Laterality: Right;  . MELANOMA EXCISION WITH SENTINEL LYMPH NODE BIOPSY Right 10/13/2019   Procedure: WIDE LOCAL EXCISION WITH ADVANCEMENT FLAP CLOSURE AND SENTINEL LYMPH NODE BIOPSY;  Surgeon: Stark Klein, MD;  Location: [facility];  Service: General;  Laterality: Right;  . WISDOM TOOTH EXTRACTION    :   Current Outpatient Medications:  .  Cholecalciferol (VITAMIN D) 125 MCG (5000 UT) CAPS, Take 5,000 Units by mouth daily., Disp: , Rfl:  .  Coenzyme Q10 (CO Q-10) 100 MG CAPS, Take 100 mg by mouth daily. , Disp: , Rfl:  .  meloxicam (MOBIC) 7.5 MG tablet, Take 7.5 mg by mouth daily as needed for pain., Disp: , Rfl:  .  Omega-3 Fatty Acids (FISH OIL TRIPLE STRENGTH PO), Take 2,400 mg by mouth daily. , Disp: , Rfl:  .  oxyCODONE (OXY IR/ROXICODONE) 5 MG immediate release tablet, Take 1 tablet (5 mg total) by mouth every 6 (six) hours as needed for severe pain., Disp: 10 tablet, Rfl: 0 .  simvastatin (ZOCOR) 40 MG tablet, Take 40 mg by mouth daily. , Disp: , Rfl: :  Allergies  Allergen Reactions  . Codeine Nausea And Vomiting    "makes me sick"   :  No family history on file.:  Social History   Socioeconomic History  . Marital status: Married    Spouse name: Not on file  . Number of children: Not on file  . Years of education: Not on file  . Highest education level: Not on file  Occupational History  . Not on file  Tobacco Use  . Smoking status: Never  Smoker  . Smokeless tobacco: Never Used  Vaping Use  . Vaping Use: Never used  Substance and Sexual Activity  . Alcohol use: Yes    Alcohol/week: 3.0 - 4.0 standard drinks    Types: 3 - 4 Glasses of wine per week  . Drug use: No  . Sexual activity: Yes    Birth control/protection: Post-menopausal  Other Topics Concern  . Not on file  Social History Narrative  . Not on file   Social Determinants of Health   Financial Resource Strain:   . Difficulty of Paying Living Expenses: Not on file  Food Insecurity:   . Worried About Charity fundraiser in the Last Year: Not on file  . Ran Out of Food in the Last Year: Not on file  Transportation Needs:   . Lack of Transportation (Medical): Not on file  . Lack of Transportation (Non-Medical): Not on file  Physical Activity:   . Days of Exercise per Week: Not on file  . Minutes of Exercise per Session: Not on file  Stress:   . Feeling of Stress : Not on file  Social Connections:   . Frequency of Communication with Friends and Family: Not on file  . Frequency of Social Gatherings with Friends and Family: Not on file  . Attends Religious Services: Not on file  . Active Member of Clubs or Organizations: Not on file  . Attends Archivist Meetings: Not on file  . Marital Status: Not on file  Intimate Partner Violence:   . Fear of Current or Ex-Partner: Not on file  . Emotionally Abused: Not on file  . Physically Abused: Not on file  . Sexually Abused: Not on file  :  Pertinent items are noted in HPI.  Exam: Blood pressure 123/63, pulse 68, temperature (!) 97 F (36.1 C), temperature source Tympanic, resp. rate 18, height 5\' 9"  (1.753 m), weight 173 lb (78.5 kg), SpO2 98 %.   ECOG 0 General appearance: alert and cooperative appeared without distress. Head: atraumatic without any abnormalities. Eyes: conjunctivae/corneas clear. PERRL.  Sclera anicteric. Throat: lips, mucosa, and tongue normal; without oral thrush or  ulcers. Resp: clear to auscultation bilaterally without rhonchi, wheezes or dullness to percussion. Cardio: regular rate and rhythm, S1, S2 normal, no murmur, click, rub or gallop GI: soft, non-tender; bowel sounds normal; no masses,  no organomegaly Skin: No rashes or lesions.  Incision site is dressed. Lymph nodes: Cervical, supraclavicular, and axillary nodes normal. Neurologic: Grossly normal without any motor, sensory or deep tendon reflexes. Musculoskeletal: No joint deformity or effusion.  CBC    Component Value Date/Time   WBC 5.5 09/12/2020 1155   RBC 4.60 09/12/2020 1155  HGB 13.4 09/12/2020 1155   HCT 42.4 09/12/2020 1155   PLT 235 09/12/2020 1155   MCV 92.2 09/12/2020 1155   MCH 29.1 09/12/2020 1155   MCHC 31.6 09/12/2020 1155   RDW 13.3 09/12/2020 1155   LYMPHSABS 1.6 10/10/2019 1522   MONOABS 0.5 10/10/2019 1522   EOSABS 0.0 10/10/2019 1522   BASOSABS 0.1 10/10/2019 1522     Chemistry      Component Value Date/Time   NA 137 10/10/2019 1522   K 3.5 10/10/2019 1522   CL 103 10/10/2019 1522   CO2 24 10/10/2019 1522   BUN 22 10/10/2019 1522   CREATININE 0.90 10/10/2019 1522      Component Value Date/Time   CALCIUM 9.8 10/10/2019 1522   ALKPHOS 71 10/10/2019 1522   AST 29 10/10/2019 1522   ALT 24 10/10/2019 1522   BILITOT 1.1 10/10/2019 1522       NM PET Image Initial (PI) Whole Body  Result Date: 09/11/2020 CLINICAL DATA:  Initial treatment strategy for right calf melanoma. Surgical excision and inguinal sentinel lymph node excision 10/13/2019. EXAM: NUCLEAR MEDICINE PET WHOLE BODY TECHNIQUE: 8.3 mCi F-18 FDG was injected intravenously. Full-ring PET imaging was performed from the head to foot after the radiotracer. CT data was obtained and used for attenuation correction and anatomic localization. Fasting blood glucose: 85 mg/dl COMPARISON:  Abdominal CT 01/17/2008 FINDINGS: Mediastinal blood pool activity: SUV max 2.2 HEAD/ NECK: No hypermetabolic cervical  lymph nodes are identified.Mildly prominent activity associated with the lymphoid tissue in Waldeyer's ring with an asymmetric component along the base of the tongue on the right, within physiologic limits. Incidental CT findings: Bilateral carotid atherosclerosis. CHEST: There are no hypermetabolic mediastinal, hilar or axillary lymph nodes. No hypermetabolic pulmonary activity or suspicious nodularity. Incidental CT findings: Mild atherosclerosis of the aorta, great vessels and coronary arteries. Mild subpleural reticulation at both lung bases. ABDOMEN/PELVIS: There is no hypermetabolic activity within the liver, adrenal glands, spleen or pancreas. There is no hypermetabolic nodal activity. Incidental CT findings: Enlarging exophytic left renal cyst. Mild aortic and branch vessel atherosclerosis. Diverticular changes throughout the distal colon. SKELETON: There is no hypermetabolic activity to suggest osseous metastatic disease. Incidental CT findings: Mild-to-moderate lumbar spondylosis. EXTREMITIES: No suspicious osseous metabolic activity. No suspicious soft tissue activity. There is low-level activity superficially in the subcutaneous fat of the right calf which is probably postsurgical (SUV max 1.6). No hypermetabolic lymph nodes identified within popliteal fossa or inguinal regions. No hypermetabolic subcutaneous or dermal lesions. Incidental CT findings: There are surgical clips in the right groin. IMPRESSION: 1. No evidence of metastatic disease. 2. No suspicious activity within the right calf. Low level superficial activity is likely postsurgical. 3. No significant incidental findings. Sigmoid diverticulosis. Aortic Atherosclerosis (ICD10-I70.0). Electronically Signed   By: Richardean Sale M.D.   On: 09/11/2020 16:39    Assessment and Plan:   69 year old woman with:  1.  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  She was found to have T2AN0 tumor that  has been resected with a negative sentinel lymph node sampling in October 2020.  He developed recurrent disease around the incision site.  She underwent repeat excision under the care of Dr. Barry Dienes on September 18, 2020.  The final pathology is currently pending.  PET CT scan showed no evident of metastatic disease.  The natural course of this disease was reviewed and treatment options were discussed at this time.  The differential diagnosis of this finding  were reviewed.  Second primary versus in-transit metastasis are a consideration.  It is likely that we are dealing with in-transit metastasis is a more likely explanation.  Treatment options moving forward were reviewed.  Observation and surveillance versus adjuvant immunotherapy were discussed.  If we are dealing with a stage IV resected disease there is evidence to support the use of adjuvant immunotherapy in the form of nivolumab on a monthly basis for 12 months.  Complication associated with this therapy to include nausea, fatigue, pruritus and immune mediated complications were discussed.  Observation and surveillance could be also an option if reassuring that this is a second primary that was resected.  After discussion today, she will think about these options and let me know in the near future.  We will arrange education class to specifically address nivolumab infusion and written information was given to her.  She will let me know in the near future.  2.  IV access: Peripheral veins will be in use if she decided to go with treatment.  3.  Antiemetics: Prescription for Compazine will be made available to her.  4.  Immune mediated complications: We will monitor thyroid function and will continue to educate her about pneumonitis, colitis and hepatitis.  5.  Follow-up: Will be determined pending her decision regarding treatment.  60  minutes were dedicated to this visit. The time was spent on reviewing laboratory data, imaging studies,  discussing treatment options, discussing differential diagnosis and answering questions regarding future plan.    A copy of this consult has been forwarded to the requesting physician.

## 2020-09-21 ENCOUNTER — Telehealth: Payer: Self-pay | Admitting: Oncology

## 2020-09-21 NOTE — Telephone Encounter (Signed)
Scheduled per 09/30, patient has been called and voicemail was left.

## 2020-09-24 LAB — SURGICAL PATHOLOGY

## 2020-09-25 ENCOUNTER — Telehealth: Payer: Self-pay | Admitting: Oncology

## 2020-09-25 NOTE — Telephone Encounter (Signed)
Scheduled per los, patient has been called and notified. 

## 2020-09-28 ENCOUNTER — Other Ambulatory Visit: Payer: Self-pay

## 2020-09-28 ENCOUNTER — Inpatient Hospital Stay: Payer: PPO | Attending: Oncology

## 2020-09-28 DIAGNOSIS — Z85820 Personal history of malignant melanoma of skin: Secondary | ICD-10-CM | POA: Insufficient documentation

## 2020-09-28 DIAGNOSIS — Z79899 Other long term (current) drug therapy: Secondary | ICD-10-CM | POA: Insufficient documentation

## 2020-09-28 DIAGNOSIS — Z791 Long term (current) use of non-steroidal anti-inflammatories (NSAID): Secondary | ICD-10-CM | POA: Insufficient documentation

## 2020-10-01 DIAGNOSIS — M9903 Segmental and somatic dysfunction of lumbar region: Secondary | ICD-10-CM | POA: Diagnosis not present

## 2020-10-01 DIAGNOSIS — M9902 Segmental and somatic dysfunction of thoracic region: Secondary | ICD-10-CM | POA: Diagnosis not present

## 2020-10-01 DIAGNOSIS — M791 Myalgia, unspecified site: Secondary | ICD-10-CM | POA: Diagnosis not present

## 2020-10-01 DIAGNOSIS — M9905 Segmental and somatic dysfunction of pelvic region: Secondary | ICD-10-CM | POA: Diagnosis not present

## 2020-10-09 DIAGNOSIS — C439 Malignant melanoma of skin, unspecified: Secondary | ICD-10-CM | POA: Diagnosis not present

## 2020-10-10 DIAGNOSIS — L821 Other seborrheic keratosis: Secondary | ICD-10-CM | POA: Diagnosis not present

## 2020-10-10 DIAGNOSIS — Z85820 Personal history of malignant melanoma of skin: Secondary | ICD-10-CM | POA: Diagnosis not present

## 2020-10-10 DIAGNOSIS — L812 Freckles: Secondary | ICD-10-CM | POA: Diagnosis not present

## 2020-10-10 DIAGNOSIS — D225 Melanocytic nevi of trunk: Secondary | ICD-10-CM | POA: Diagnosis not present

## 2020-10-10 DIAGNOSIS — D1801 Hemangioma of skin and subcutaneous tissue: Secondary | ICD-10-CM | POA: Diagnosis not present

## 2020-10-10 DIAGNOSIS — Z85828 Personal history of other malignant neoplasm of skin: Secondary | ICD-10-CM | POA: Diagnosis not present

## 2020-10-15 ENCOUNTER — Other Ambulatory Visit: Payer: Self-pay

## 2020-10-15 ENCOUNTER — Inpatient Hospital Stay: Payer: PPO | Admitting: Oncology

## 2020-10-15 VITALS — BP 131/64 | HR 70 | Temp 96.5°F | Resp 17 | Ht 69.0 in | Wt 173.4 lb

## 2020-10-15 DIAGNOSIS — Z85820 Personal history of malignant melanoma of skin: Secondary | ICD-10-CM | POA: Diagnosis not present

## 2020-10-15 DIAGNOSIS — C439 Malignant melanoma of skin, unspecified: Secondary | ICD-10-CM

## 2020-10-15 DIAGNOSIS — M9902 Segmental and somatic dysfunction of thoracic region: Secondary | ICD-10-CM | POA: Diagnosis not present

## 2020-10-15 DIAGNOSIS — M9903 Segmental and somatic dysfunction of lumbar region: Secondary | ICD-10-CM | POA: Diagnosis not present

## 2020-10-15 DIAGNOSIS — M791 Myalgia, unspecified site: Secondary | ICD-10-CM | POA: Diagnosis not present

## 2020-10-15 DIAGNOSIS — Z79899 Other long term (current) drug therapy: Secondary | ICD-10-CM | POA: Diagnosis not present

## 2020-10-15 DIAGNOSIS — M9905 Segmental and somatic dysfunction of pelvic region: Secondary | ICD-10-CM | POA: Diagnosis not present

## 2020-10-15 DIAGNOSIS — Z791 Long term (current) use of non-steroidal anti-inflammatories (NSAID): Secondary | ICD-10-CM | POA: Diagnosis not present

## 2020-10-15 NOTE — Progress Notes (Signed)
Hematology and Oncology Follow Up Visit  Claire Wall [id number] 22-May-1951 69 y.o. 10/15/2020 8:24 AM Little, Claire Wall MDLittle, Claire Bihari, MD   Principle Diagnosis: 69 year old woman with superficial spreading melanoma of the right calf diagnosed with T2a disease in October 2020.  She had disease recurrence with in-transit metastasis on September 18, 2020.   Prior Therapy: She is status post wide excision and sentinel lymph node sampling in October 2020 with repeat excision in September 2021.  Current therapy: Active surveillance and under evaluation for possible adjuvant therapy.  Interim History: [name] returns today for a follow-up visit.  Since the last visit, she reports no major changes in her health. Her right calf wound is healing properly although she is on antibiotic after cellulitis diagnosis. She denies any weight loss or appetite changes. Her performance status quality of life remain excellent.    Medications: I have reviewed the patient's current medications.  Current Outpatient Medications  Medication Sig Dispense Refill  . Cholecalciferol (VITAMIN D) 125 MCG (5000 UT) CAPS Take 5,000 Units by mouth daily.    . Coenzyme Q10 (CO Q-10) 100 MG CAPS Take 100 mg by mouth daily.     . meloxicam (MOBIC) 7.5 MG tablet Take 7.5 mg by mouth daily as needed for pain.    . Omega-3 Fatty Acids (FISH OIL TRIPLE STRENGTH PO) Take 2,400 mg by mouth daily.     Marland Kitchen oxyCODONE (OXY IR/ROXICODONE) 5 MG immediate release tablet Take 1 tablet (5 mg total) by mouth every 6 (six) hours as needed for severe pain. (Patient not taking: Reported on 09/20/2020) 10 tablet 0  . simvastatin (ZOCOR) 40 MG tablet Take 40 mg by mouth daily.      No current facility-administered medications for this visit.     Allergies:  Allergies  Allergen Reactions  . Codeine Nausea And Vomiting    "makes me sick"     Physical Exam: Blood pressure 131/64, pulse 70, temperature (!) 96.5 F (35.8 C), temperature  source Tympanic, resp. rate 17, height 5\' 9"  (1.753 m), weight 173 lb 6.4 oz (78.7 kg), SpO2 99 %.   ECOG: 0   General appearance: Comfortable appearing without any discomfort Head: Normocephalic without any trauma Oropharynx: Mucous membranes are moist and pink without any thrush or ulcers. Eyes: Pupils are equal and round reactive to light. Lymph nodes: No cervical, supraclavicular, inguinal or axillary lymphadenopathy.   Heart:regular rate and rhythm.  S1 and S2 without leg edema. Lung: Clear without any rhonchi or wheezes.  No dullness to percussion. Abdomin: Soft, nontender, nondistended with good bowel sounds.  No hepatosplenomegaly. Musculoskeletal: No joint deformity or effusion.  Full range of motion noted. Neurological: No deficits noted on motor, sensory and deep tendon reflex exam. Skin: Mild erythema around the of the right calf     Lab Results: Lab Results  Component Value Date   WBC 5.5 09/12/2020   HGB 13.4 09/12/2020   HCT 42.4 09/12/2020   MCV 92.2 09/12/2020   PLT 235 09/12/2020     Chemistry      Component Value Date/Time   NA 137 10/10/2019 1522   K 3.5 10/10/2019 1522   CL 103 10/10/2019 1522   CO2 24 10/10/2019 1522   BUN 22 10/10/2019 1522   CREATININE 0.90 10/10/2019 1522      Component Value Date/Time   CALCIUM 9.8 10/10/2019 1522   ALKPHOS 71 10/10/2019 1522   AST 29 10/10/2019 1522   ALT 24 10/10/2019 1522   BILITOT  1.1 10/10/2019 1522        Impression and Plan:   69 year old woman with:  1.    Cutaneous melanoma of the right calf presented with T2aN0 superficial spreading subtype in September 2020.  She status post surgical resection followed by recurrence in September 2021 for possible in-transit metastasis.  Her PET scan is negative.  The natural course of her disease was reviewed again the risks and benefits of adjuvant immunotherapy were reiterated.  She was also seen at the [facility] with Dr. Clance Boll and options of  therapy were reviewed again.  Immune mediated complications such as pneumonitis, dermatitis, colitis were discussed.  Alternatively, continued active surveillance with reinitiating immunotherapy could be used on recurrence.  After discussion today, she opted with active surveillance and the plan is to update her staging in January 2022 and follow-up at that time. She understands if she has recurrence at that time or anytime she will proceed with immunotherapy.    2. Dermatology surveillance: She is up-to-date at this time she will continue to have that done periodically.  3.  Follow-up: In January 2022 for repeat evaluation.  30  minutes were spent on this encounter. The time was dedicated to updating her disease status, discussing complications related to therapy and future plan of care review.     Zola Button, MD 10/25/20218:24 AM

## 2020-10-16 DIAGNOSIS — M25571 Pain in right ankle and joints of right foot: Secondary | ICD-10-CM | POA: Diagnosis not present

## 2020-10-18 DIAGNOSIS — M25571 Pain in right ankle and joints of right foot: Secondary | ICD-10-CM | POA: Diagnosis not present

## 2020-10-22 DIAGNOSIS — M25571 Pain in right ankle and joints of right foot: Secondary | ICD-10-CM | POA: Diagnosis not present

## 2020-10-22 DIAGNOSIS — C4371 Malignant melanoma of right lower limb, including hip: Secondary | ICD-10-CM | POA: Diagnosis not present

## 2020-10-24 DIAGNOSIS — M25571 Pain in right ankle and joints of right foot: Secondary | ICD-10-CM | POA: Diagnosis not present

## 2020-10-29 DIAGNOSIS — M25571 Pain in right ankle and joints of right foot: Secondary | ICD-10-CM | POA: Diagnosis not present

## 2020-10-31 DIAGNOSIS — M5416 Radiculopathy, lumbar region: Secondary | ICD-10-CM | POA: Diagnosis not present

## 2020-11-01 DIAGNOSIS — M25571 Pain in right ankle and joints of right foot: Secondary | ICD-10-CM | POA: Diagnosis not present

## 2020-11-05 DIAGNOSIS — M25571 Pain in right ankle and joints of right foot: Secondary | ICD-10-CM | POA: Diagnosis not present

## 2020-11-08 DIAGNOSIS — M25571 Pain in right ankle and joints of right foot: Secondary | ICD-10-CM | POA: Diagnosis not present

## 2020-11-12 DIAGNOSIS — M9905 Segmental and somatic dysfunction of pelvic region: Secondary | ICD-10-CM | POA: Diagnosis not present

## 2020-11-12 DIAGNOSIS — M791 Myalgia, unspecified site: Secondary | ICD-10-CM | POA: Diagnosis not present

## 2020-11-12 DIAGNOSIS — M9903 Segmental and somatic dysfunction of lumbar region: Secondary | ICD-10-CM | POA: Diagnosis not present

## 2020-11-12 DIAGNOSIS — M9902 Segmental and somatic dysfunction of thoracic region: Secondary | ICD-10-CM | POA: Diagnosis not present

## 2020-11-13 DIAGNOSIS — M25571 Pain in right ankle and joints of right foot: Secondary | ICD-10-CM | POA: Diagnosis not present

## 2020-11-19 DIAGNOSIS — M25571 Pain in right ankle and joints of right foot: Secondary | ICD-10-CM | POA: Diagnosis not present

## 2020-11-20 ENCOUNTER — Other Ambulatory Visit: Payer: Self-pay | Admitting: Family Medicine

## 2020-11-20 DIAGNOSIS — M5416 Radiculopathy, lumbar region: Secondary | ICD-10-CM

## 2020-11-22 ENCOUNTER — Other Ambulatory Visit: Payer: Self-pay

## 2020-11-22 ENCOUNTER — Ambulatory Visit
Admission: RE | Admit: 2020-11-22 | Discharge: 2020-11-22 | Disposition: A | Discharge status: Home / Self Care | Payer: PPO | Source: Ambulatory Visit | Attending: Family Medicine | Admitting: Family Medicine

## 2020-11-22 DIAGNOSIS — M48061 Spinal stenosis, lumbar region without neurogenic claudication: Secondary | ICD-10-CM | POA: Diagnosis not present

## 2020-11-22 DIAGNOSIS — M5416 Radiculopathy, lumbar region: Secondary | ICD-10-CM

## 2020-11-22 DIAGNOSIS — M25571 Pain in right ankle and joints of right foot: Secondary | ICD-10-CM | POA: Diagnosis not present

## 2020-11-22 DIAGNOSIS — M545 Low back pain, unspecified: Secondary | ICD-10-CM | POA: Diagnosis not present

## 2020-11-24 ENCOUNTER — Other Ambulatory Visit: Payer: PPO

## 2020-11-26 DIAGNOSIS — M25571 Pain in right ankle and joints of right foot: Secondary | ICD-10-CM | POA: Diagnosis not present

## 2020-11-27 DIAGNOSIS — M25571 Pain in right ankle and joints of right foot: Secondary | ICD-10-CM | POA: Diagnosis not present

## 2020-11-29 DIAGNOSIS — M48061 Spinal stenosis, lumbar region without neurogenic claudication: Secondary | ICD-10-CM | POA: Diagnosis not present

## 2020-11-29 DIAGNOSIS — R030 Elevated blood-pressure reading, without diagnosis of hypertension: Secondary | ICD-10-CM | POA: Diagnosis not present

## 2020-11-29 DIAGNOSIS — Z6825 Body mass index (BMI) 25.0-25.9, adult: Secondary | ICD-10-CM | POA: Diagnosis not present

## 2020-11-29 DIAGNOSIS — M5136 Other intervertebral disc degeneration, lumbar region: Secondary | ICD-10-CM | POA: Diagnosis not present

## 2020-12-04 DIAGNOSIS — M25571 Pain in right ankle and joints of right foot: Secondary | ICD-10-CM | POA: Diagnosis not present

## 2020-12-06 DIAGNOSIS — K644 Residual hemorrhoidal skin tags: Secondary | ICD-10-CM | POA: Diagnosis not present

## 2020-12-06 DIAGNOSIS — M25571 Pain in right ankle and joints of right foot: Secondary | ICD-10-CM | POA: Diagnosis not present

## 2020-12-06 DIAGNOSIS — K625 Hemorrhage of anus and rectum: Secondary | ICD-10-CM | POA: Diagnosis not present

## 2020-12-06 DIAGNOSIS — K5790 Diverticulosis of intestine, part unspecified, without perforation or abscess without bleeding: Secondary | ICD-10-CM | POA: Diagnosis not present

## 2020-12-06 DIAGNOSIS — Z86010 Personal history of colonic polyps: Secondary | ICD-10-CM | POA: Diagnosis not present

## 2020-12-06 DIAGNOSIS — K648 Other hemorrhoids: Secondary | ICD-10-CM | POA: Diagnosis not present

## 2020-12-10 DIAGNOSIS — M25571 Pain in right ankle and joints of right foot: Secondary | ICD-10-CM | POA: Diagnosis not present

## 2020-12-12 DIAGNOSIS — M25571 Pain in right ankle and joints of right foot: Secondary | ICD-10-CM | POA: Diagnosis not present

## 2020-12-12 DIAGNOSIS — M48061 Spinal stenosis, lumbar region without neurogenic claudication: Secondary | ICD-10-CM | POA: Diagnosis not present

## 2020-12-12 DIAGNOSIS — M5136 Other intervertebral disc degeneration, lumbar region: Secondary | ICD-10-CM | POA: Diagnosis not present

## 2020-12-13 DIAGNOSIS — K625 Hemorrhage of anus and rectum: Secondary | ICD-10-CM | POA: Diagnosis not present

## 2020-12-13 DIAGNOSIS — Z86010 Personal history of colonic polyps: Secondary | ICD-10-CM | POA: Diagnosis not present

## 2020-12-17 DIAGNOSIS — M25571 Pain in right ankle and joints of right foot: Secondary | ICD-10-CM | POA: Diagnosis not present

## 2020-12-20 DIAGNOSIS — M25571 Pain in right ankle and joints of right foot: Secondary | ICD-10-CM | POA: Diagnosis not present

## 2020-12-24 DIAGNOSIS — D485 Neoplasm of uncertain behavior of skin: Secondary | ICD-10-CM | POA: Diagnosis not present

## 2020-12-24 DIAGNOSIS — C4371 Malignant melanoma of right lower limb, including hip: Secondary | ICD-10-CM | POA: Diagnosis not present

## 2020-12-24 DIAGNOSIS — Z85820 Personal history of malignant melanoma of skin: Secondary | ICD-10-CM | POA: Diagnosis not present

## 2020-12-24 DIAGNOSIS — M25571 Pain in right ankle and joints of right foot: Secondary | ICD-10-CM | POA: Diagnosis not present

## 2020-12-24 DIAGNOSIS — Z85828 Personal history of other malignant neoplasm of skin: Secondary | ICD-10-CM | POA: Diagnosis not present

## 2020-12-27 DIAGNOSIS — M25571 Pain in right ankle and joints of right foot: Secondary | ICD-10-CM | POA: Diagnosis not present

## 2020-12-31 DIAGNOSIS — M5416 Radiculopathy, lumbar region: Secondary | ICD-10-CM | POA: Diagnosis not present

## 2021-01-04 DIAGNOSIS — C439 Malignant melanoma of skin, unspecified: Secondary | ICD-10-CM | POA: Diagnosis not present

## 2021-01-04 DIAGNOSIS — R2241 Localized swelling, mass and lump, right lower limb: Secondary | ICD-10-CM | POA: Diagnosis not present

## 2021-01-04 DIAGNOSIS — C799 Secondary malignant neoplasm of unspecified site: Secondary | ICD-10-CM | POA: Diagnosis not present

## 2021-01-08 ENCOUNTER — Inpatient Hospital Stay: Payer: PPO | Attending: Oncology

## 2021-01-08 ENCOUNTER — Ambulatory Visit (HOSPITAL_COMMUNITY)
Admission: RE | Admit: 2021-01-08 | Discharge: 2021-01-08 | Disposition: A | Discharge status: Home / Self Care | Payer: PPO | Source: Ambulatory Visit | Attending: Oncology | Admitting: Oncology

## 2021-01-08 ENCOUNTER — Other Ambulatory Visit: Payer: Self-pay

## 2021-01-08 DIAGNOSIS — M25571 Pain in right ankle and joints of right foot: Secondary | ICD-10-CM | POA: Diagnosis not present

## 2021-01-08 DIAGNOSIS — Z791 Long term (current) use of non-steroidal anti-inflammatories (NSAID): Secondary | ICD-10-CM | POA: Insufficient documentation

## 2021-01-08 DIAGNOSIS — Z79899 Other long term (current) drug therapy: Secondary | ICD-10-CM | POA: Diagnosis not present

## 2021-01-08 DIAGNOSIS — C439 Malignant melanoma of skin, unspecified: Secondary | ICD-10-CM

## 2021-01-08 DIAGNOSIS — Z85820 Personal history of malignant melanoma of skin: Secondary | ICD-10-CM | POA: Insufficient documentation

## 2021-01-08 LAB — CBC WITH DIFFERENTIAL (CANCER CENTER ONLY)
Abs Immature Granulocytes: 0.01 10*3/uL (ref 0.00–0.07)
Basophils Absolute: 0.1 10*3/uL (ref 0.0–0.1)
Basophils Relative: 1 %
Eosinophils Absolute: 0.2 10*3/uL (ref 0.0–0.5)
Eosinophils Relative: 3 %
HCT: 40.6 % (ref 36.0–46.0)
Hemoglobin: 13.4 g/dL (ref 12.0–15.0)
Immature Granulocytes: 0 %
Lymphocytes Relative: 21 %
Lymphs Abs: 1.4 10*3/uL (ref 0.7–4.0)
MCH: 29.4 pg (ref 26.0–34.0)
MCHC: 33 g/dL (ref 30.0–36.0)
MCV: 89 fL (ref 80.0–100.0)
Monocytes Absolute: 0.6 10*3/uL (ref 0.1–1.0)
Monocytes Relative: 9 %
Neutro Abs: 4.3 10*3/uL (ref 1.7–7.7)
Neutrophils Relative %: 66 %
Platelet Count: 232 10*3/uL (ref 150–400)
RBC: 4.56 MIL/uL (ref 3.87–5.11)
RDW: 13.6 % (ref 11.5–15.5)
WBC Count: 6.5 10*3/uL (ref 4.0–10.5)
nRBC: 0 % (ref 0.0–0.2)

## 2021-01-08 LAB — CMP (CANCER CENTER ONLY)
ALT: 23 U/L (ref 0–44)
AST: 25 U/L (ref 15–41)
Albumin: 4.4 g/dL (ref 3.5–5.0)
Alkaline Phosphatase: 64 U/L (ref 38–126)
Anion gap: 8 (ref 5–15)
BUN: 18 mg/dL (ref 8–23)
CO2: 29 mmol/L (ref 22–32)
Calcium: 9.8 mg/dL (ref 8.9–10.3)
Chloride: 103 mmol/L (ref 98–111)
Creatinine: 0.57 mg/dL (ref 0.44–1.00)
GFR, Estimated: >60 mL/min (ref >60)
Glucose, Bld: 99 mg/dL (ref 70–99)
Potassium: 4.6 mmol/L (ref 3.5–5.1)
Sodium: 140 mmol/L (ref 135–145)
Total Bilirubin: 1.2 mg/dL (ref 0.3–1.2)
Total Protein: 7.7 g/dL (ref 6.5–8.1)

## 2021-01-08 LAB — GLUCOSE, CAPILLARY: Glucose-Capillary: 96 mg/dL (ref 70–99)

## 2021-01-08 LAB — LACTATE DEHYDROGENASE: LDH: 189 U/L (ref 98–192)

## 2021-01-08 MED ORDER — FLUDEOXYGLUCOSE F - 18 (FDG) INJECTION
8.6100 | Freq: Once | INTRAVENOUS | Status: AC | PRN
  Administered 2021-01-08: 8.61 via INTRAVENOUS

## 2021-01-09 DIAGNOSIS — Z85828 Personal history of other malignant neoplasm of skin: Secondary | ICD-10-CM | POA: Diagnosis not present

## 2021-01-09 DIAGNOSIS — D0371 Melanoma in situ of right lower limb, including hip: Secondary | ICD-10-CM | POA: Diagnosis not present

## 2021-01-09 DIAGNOSIS — L814 Other melanin hyperpigmentation: Secondary | ICD-10-CM | POA: Diagnosis not present

## 2021-01-09 DIAGNOSIS — Z85820 Personal history of malignant melanoma of skin: Secondary | ICD-10-CM | POA: Diagnosis not present

## 2021-01-09 DIAGNOSIS — D2271 Melanocytic nevi of right lower limb, including hip: Secondary | ICD-10-CM | POA: Diagnosis not present

## 2021-01-09 DIAGNOSIS — L821 Other seborrheic keratosis: Secondary | ICD-10-CM | POA: Diagnosis not present

## 2021-01-09 DIAGNOSIS — L570 Actinic keratosis: Secondary | ICD-10-CM | POA: Diagnosis not present

## 2021-01-09 DIAGNOSIS — C4371 Malignant melanoma of right lower limb, including hip: Secondary | ICD-10-CM | POA: Diagnosis not present

## 2021-01-09 DIAGNOSIS — D2272 Melanocytic nevi of left lower limb, including hip: Secondary | ICD-10-CM | POA: Diagnosis not present

## 2021-01-09 DIAGNOSIS — D1801 Hemangioma of skin and subcutaneous tissue: Secondary | ICD-10-CM | POA: Diagnosis not present

## 2021-01-10 DIAGNOSIS — C439 Malignant melanoma of skin, unspecified: Secondary | ICD-10-CM | POA: Diagnosis not present

## 2021-01-10 DIAGNOSIS — C4371 Malignant melanoma of right lower limb, including hip: Secondary | ICD-10-CM | POA: Insufficient documentation

## 2021-01-11 ENCOUNTER — Other Ambulatory Visit: Payer: Self-pay

## 2021-01-11 ENCOUNTER — Inpatient Hospital Stay: Payer: PPO | Admitting: Oncology

## 2021-01-11 VITALS — BP 145/83 | HR 72 | Temp 97.8°F | Resp 20 | Ht 69.0 in | Wt 173.6 lb

## 2021-01-11 DIAGNOSIS — C439 Malignant melanoma of skin, unspecified: Secondary | ICD-10-CM

## 2021-01-11 DIAGNOSIS — Z85820 Personal history of malignant melanoma of skin: Secondary | ICD-10-CM | POA: Diagnosis not present

## 2021-01-11 DIAGNOSIS — M25571 Pain in right ankle and joints of right foot: Secondary | ICD-10-CM | POA: Diagnosis not present

## 2021-01-11 NOTE — Progress Notes (Signed)
Hematology and Oncology Follow Up Visit  Claire Wall [id number] Jun 09, 1951 70 y.o. 01/11/2021 9:42 AM [name], Claire Wall MDLittle, Claire Bihari, MD   Principle Diagnosis: 70 year old woman with T2Na, superficial spreading melanoma of the right calf diagnosed in October 2020 and subsequently developed diagnosed in-transit metastasis on September 18, 2020.  She developed another recurrence in January 2022.   Prior Therapy: She is status post wide excision and sentinel lymph node sampling in October 2020 with repeat excision in September 2021.  Current therapy: Observation and surveillance.  Interim History: [name] presents today for a follow-up evaluation.  Since the last visit, she had a another recurrence of her melanoma around the right calf/shin and underwent shave biopsy by Dr. Ronnald Ramp which showed recurrent melanoma.  She was evaluated by Dr. Clance Boll and Dr. Freddi Che at [facility] for local possible treatments.  Clinically, she reports no complaints at this time.  She denies any nausea vomiting or any constitutional symptoms.  She continues to attempt activities of daily living without any decline.   Medications: Updated without changes. Current Outpatient Medications  Medication Sig Dispense Refill  . Cholecalciferol (VITAMIN D) 125 MCG (5000 UT) CAPS Take 5,000 Units by mouth daily.    . Coenzyme Q10 (CO Q-10) 100 MG CAPS Take 100 mg by mouth daily.     . meloxicam (MOBIC) 7.5 MG tablet Take 7.5 mg by mouth daily as needed for pain.    . Omega-3 Fatty Acids (FISH OIL TRIPLE STRENGTH PO) Take 2,400 mg by mouth daily.     Marland Kitchen oxyCODONE (OXY IR/ROXICODONE) 5 MG immediate release tablet Take 1 tablet (5 mg total) by mouth every 6 (six) hours as needed for severe pain. (Patient not taking: Reported on 09/20/2020) 10 tablet 0  . simvastatin (ZOCOR) 40 MG tablet Take 40 mg by mouth daily.      No current facility-administered medications for this visit.     Allergies:  Allergies  Allergen  Reactions  . Codeine Nausea And Vomiting    "makes me sick"     Physical Exam: Blood pressure (!) 145/83, pulse 72, temperature 97.8 F (36.6 C), temperature source Tympanic, resp. rate 20, height 5\' 9"  (1.753 m), weight 173 lb 9.6 oz (78.7 kg), SpO2 100 %.    ECOG: 0    General appearance: Alert, awake without any distress. Head: Atraumatic without abnormalities Oropharynx: Without any thrush or ulcers. Eyes: No scleral icterus. Lymph nodes: No lymphadenopathy noted in the cervical, supraclavicular, or axillary nodes Heart:regular rate and rhythm, without any murmurs or gallops.   Lung: Clear to auscultation without any rhonchi, wheezes or dullness to percussion. Abdomin: Soft, nontender without any shifting dullness or ascites. Musculoskeletal: No clubbing or cyanosis. Neurological: No motor or sensory deficits. Skin: Few pigmented lesions noted on the posterior aspect of her right lower extremity around her surgical scar.      Lab Results: Lab Results  Component Value Date   WBC 6.5 01/08/2021   HGB 13.4 01/08/2021   HCT 40.6 01/08/2021   MCV 89.0 01/08/2021   PLT 232 01/08/2021     Chemistry      Component Value Date/Time   NA 140 01/08/2021 1036   K 4.6 01/08/2021 1036   CL 103 01/08/2021 1036   CO2 29 01/08/2021 1036   BUN 18 01/08/2021 1036   CREATININE 0.57 01/08/2021 1036      Component Value Date/Time   CALCIUM 9.8 01/08/2021 1036   ALKPHOS 64 01/08/2021 1036   AST 25  01/08/2021 1036   ALT 23 01/08/2021 1036   BILITOT 1.2 01/08/2021 1036      IMPRESSION: 1. Stable postoperative changes involving the right calf. Minimal interstitial postsurgical scarring type changes in the subcutaneous fat. No findings suspicious for residual or recurrent tumor. 2. No findings for metastatic disease.    Impression and Plan:   70 year old woman with:  1.  T2aN0 superficial spreading melanoma of the right calf diagnosed in September 2020.  She  subsequently developed possible in-transit metastasis in September 2021.  She had another recurrent disease after having a shave biopsy on 12/24/2020.  The natural course of her disease was reviewed.  Treatment options were discussed.  PET CT scan obtained in January 2022 showed no evidence of relapsed disease.  She has clearly multiple local recurrences and local therapy would be reasonable at this time.  She elected to proceed with intralesional TVEC therapy and defer systemic therapy for the time being.  She will start this in the near future and complete of course close to 3 months.  For the time being we will continue active surveillance and institute systemic therapy whenever it felt it is appropriate.  We will defer to the expertise of Dr. Clance Boll for that.  We are happy to administer nivolumab or Pembrolizumab locally once that is felt appropriate.    2. Dermatology surveillance: She will continue to follow-up with Dr. Jessee Avers regarding this issue.  3.  Follow-up: In 3 months for repeat follow-up.  30  minutes were spent on this visit.  The time was dedicated to reviewing her imaging studies, disease status update, treatment options and future plan of care review.     Zola Button, MD 1/21/20229:42 AM

## 2021-01-15 DIAGNOSIS — M25571 Pain in right ankle and joints of right foot: Secondary | ICD-10-CM | POA: Diagnosis not present

## 2021-01-17 DIAGNOSIS — Z791 Long term (current) use of non-steroidal anti-inflammatories (NSAID): Secondary | ICD-10-CM | POA: Diagnosis not present

## 2021-01-17 DIAGNOSIS — C4371 Malignant melanoma of right lower limb, including hip: Secondary | ICD-10-CM | POA: Diagnosis not present

## 2021-01-17 DIAGNOSIS — Z79899 Other long term (current) drug therapy: Secondary | ICD-10-CM | POA: Diagnosis not present

## 2021-01-29 DIAGNOSIS — M5416 Radiculopathy, lumbar region: Secondary | ICD-10-CM | POA: Diagnosis not present

## 2021-01-29 DIAGNOSIS — M48061 Spinal stenosis, lumbar region without neurogenic claudication: Secondary | ICD-10-CM | POA: Diagnosis not present

## 2021-01-29 DIAGNOSIS — M5136 Other intervertebral disc degeneration, lumbar region: Secondary | ICD-10-CM | POA: Diagnosis not present

## 2021-01-31 DIAGNOSIS — M25571 Pain in right ankle and joints of right foot: Secondary | ICD-10-CM | POA: Diagnosis not present

## 2021-02-05 DIAGNOSIS — M9902 Segmental and somatic dysfunction of thoracic region: Secondary | ICD-10-CM | POA: Diagnosis not present

## 2021-02-05 DIAGNOSIS — M791 Myalgia, unspecified site: Secondary | ICD-10-CM | POA: Diagnosis not present

## 2021-02-05 DIAGNOSIS — M9905 Segmental and somatic dysfunction of pelvic region: Secondary | ICD-10-CM | POA: Diagnosis not present

## 2021-02-05 DIAGNOSIS — M9903 Segmental and somatic dysfunction of lumbar region: Secondary | ICD-10-CM | POA: Diagnosis not present

## 2021-02-07 DIAGNOSIS — M25571 Pain in right ankle and joints of right foot: Secondary | ICD-10-CM | POA: Diagnosis not present

## 2021-02-11 DIAGNOSIS — M25571 Pain in right ankle and joints of right foot: Secondary | ICD-10-CM | POA: Diagnosis not present

## 2021-02-12 DIAGNOSIS — C4371 Malignant melanoma of right lower limb, including hip: Secondary | ICD-10-CM | POA: Diagnosis not present

## 2021-02-19 DIAGNOSIS — M25571 Pain in right ankle and joints of right foot: Secondary | ICD-10-CM | POA: Diagnosis not present

## 2021-02-26 DIAGNOSIS — C4371 Malignant melanoma of right lower limb, including hip: Secondary | ICD-10-CM | POA: Diagnosis not present

## 2021-02-26 DIAGNOSIS — Z885 Allergy status to narcotic agent status: Secondary | ICD-10-CM | POA: Diagnosis not present

## 2021-03-05 DIAGNOSIS — M25571 Pain in right ankle and joints of right foot: Secondary | ICD-10-CM | POA: Diagnosis not present

## 2021-03-11 DIAGNOSIS — D3709 Neoplasm of uncertain behavior of other specified sites of the oral cavity: Secondary | ICD-10-CM | POA: Diagnosis not present

## 2021-03-12 DIAGNOSIS — Z5111 Encounter for antineoplastic chemotherapy: Secondary | ICD-10-CM | POA: Diagnosis not present

## 2021-03-12 DIAGNOSIS — C4371 Malignant melanoma of right lower limb, including hip: Secondary | ICD-10-CM | POA: Diagnosis not present

## 2021-03-20 DIAGNOSIS — E7800 Pure hypercholesterolemia, unspecified: Secondary | ICD-10-CM | POA: Diagnosis not present

## 2021-03-20 DIAGNOSIS — M545 Low back pain, unspecified: Secondary | ICD-10-CM | POA: Diagnosis not present

## 2021-03-20 DIAGNOSIS — Z1339 Encounter for screening examination for other mental health and behavioral disorders: Secondary | ICD-10-CM | POA: Diagnosis not present

## 2021-03-20 DIAGNOSIS — C4371 Malignant melanoma of right lower limb, including hip: Secondary | ICD-10-CM | POA: Diagnosis not present

## 2021-03-20 DIAGNOSIS — I10 Essential (primary) hypertension: Secondary | ICD-10-CM | POA: Diagnosis not present

## 2021-03-20 DIAGNOSIS — Z1331 Encounter for screening for depression: Secondary | ICD-10-CM | POA: Diagnosis not present

## 2021-03-22 DIAGNOSIS — M25571 Pain in right ankle and joints of right foot: Secondary | ICD-10-CM | POA: Diagnosis not present

## 2021-03-29 DIAGNOSIS — C4371 Malignant melanoma of right lower limb, including hip: Secondary | ICD-10-CM | POA: Diagnosis not present

## 2021-04-03 ENCOUNTER — Other Ambulatory Visit: Payer: Self-pay | Admitting: Internal Medicine

## 2021-04-03 DIAGNOSIS — Z1231 Encounter for screening mammogram for malignant neoplasm of breast: Secondary | ICD-10-CM

## 2021-04-03 DIAGNOSIS — M25571 Pain in right ankle and joints of right foot: Secondary | ICD-10-CM | POA: Diagnosis not present

## 2021-04-08 DIAGNOSIS — M25571 Pain in right ankle and joints of right foot: Secondary | ICD-10-CM | POA: Diagnosis not present

## 2021-04-11 DIAGNOSIS — C4371 Malignant melanoma of right lower limb, including hip: Secondary | ICD-10-CM | POA: Diagnosis not present

## 2021-04-12 ENCOUNTER — Telehealth: Payer: Self-pay | Admitting: Oncology

## 2021-04-12 NOTE — Telephone Encounter (Signed)
Cancelled appt per 4/22 sch msg. Called pt, no answer. Left msg for pt to call back if interested in r/s.

## 2021-04-18 DIAGNOSIS — Z85820 Personal history of malignant melanoma of skin: Secondary | ICD-10-CM | POA: Diagnosis not present

## 2021-04-18 DIAGNOSIS — L812 Freckles: Secondary | ICD-10-CM | POA: Diagnosis not present

## 2021-04-18 DIAGNOSIS — D1801 Hemangioma of skin and subcutaneous tissue: Secondary | ICD-10-CM | POA: Diagnosis not present

## 2021-04-18 DIAGNOSIS — Z85828 Personal history of other malignant neoplasm of skin: Secondary | ICD-10-CM | POA: Diagnosis not present

## 2021-04-18 DIAGNOSIS — L570 Actinic keratosis: Secondary | ICD-10-CM | POA: Diagnosis not present

## 2021-04-18 DIAGNOSIS — L821 Other seborrheic keratosis: Secondary | ICD-10-CM | POA: Diagnosis not present

## 2021-04-24 DIAGNOSIS — M25571 Pain in right ankle and joints of right foot: Secondary | ICD-10-CM | POA: Diagnosis not present

## 2021-04-25 DIAGNOSIS — Z5112 Encounter for antineoplastic immunotherapy: Secondary | ICD-10-CM | POA: Diagnosis not present

## 2021-04-25 DIAGNOSIS — Z808 Family history of malignant neoplasm of other organs or systems: Secondary | ICD-10-CM | POA: Diagnosis not present

## 2021-04-25 DIAGNOSIS — Z79899 Other long term (current) drug therapy: Secondary | ICD-10-CM | POA: Diagnosis not present

## 2021-04-25 DIAGNOSIS — C4371 Malignant melanoma of right lower limb, including hip: Secondary | ICD-10-CM | POA: Diagnosis not present

## 2021-04-29 DIAGNOSIS — R590 Localized enlarged lymph nodes: Secondary | ICD-10-CM | POA: Diagnosis not present

## 2021-04-29 DIAGNOSIS — C4371 Malignant melanoma of right lower limb, including hip: Secondary | ICD-10-CM | POA: Diagnosis not present

## 2021-05-01 DIAGNOSIS — M25571 Pain in right ankle and joints of right foot: Secondary | ICD-10-CM | POA: Diagnosis not present

## 2021-05-08 DIAGNOSIS — M25571 Pain in right ankle and joints of right foot: Secondary | ICD-10-CM | POA: Diagnosis not present

## 2021-05-09 ENCOUNTER — Encounter (HOSPITAL_BASED_OUTPATIENT_CLINIC_OR_DEPARTMENT_OTHER): Payer: Self-pay | Admitting: Emergency Medicine

## 2021-05-09 ENCOUNTER — Emergency Department (HOSPITAL_BASED_OUTPATIENT_CLINIC_OR_DEPARTMENT_OTHER)
Admission: EM | Admit: 2021-05-09 | Discharge: 2021-05-09 | Disposition: A | Discharge status: Home / Self Care | Payer: PPO | Attending: Emergency Medicine | Admitting: Emergency Medicine

## 2021-05-09 ENCOUNTER — Ambulatory Visit: Payer: PPO | Admitting: Podiatry

## 2021-05-09 ENCOUNTER — Other Ambulatory Visit: Payer: Self-pay

## 2021-05-09 ENCOUNTER — Encounter: Payer: Self-pay | Admitting: Podiatry

## 2021-05-09 DIAGNOSIS — B070 Plantar wart: Secondary | ICD-10-CM | POA: Diagnosis not present

## 2021-05-09 DIAGNOSIS — R197 Diarrhea, unspecified: Secondary | ICD-10-CM | POA: Diagnosis not present

## 2021-05-09 DIAGNOSIS — Z85820 Personal history of malignant melanoma of skin: Secondary | ICD-10-CM | POA: Insufficient documentation

## 2021-05-09 DIAGNOSIS — I10 Essential (primary) hypertension: Secondary | ICD-10-CM | POA: Diagnosis not present

## 2021-05-09 DIAGNOSIS — R109 Unspecified abdominal pain: Secondary | ICD-10-CM | POA: Diagnosis not present

## 2021-05-09 DIAGNOSIS — R21 Rash and other nonspecific skin eruption: Secondary | ICD-10-CM | POA: Diagnosis not present

## 2021-05-09 DIAGNOSIS — T7840XA Allergy, unspecified, initial encounter: Secondary | ICD-10-CM | POA: Diagnosis not present

## 2021-05-09 DIAGNOSIS — Q828 Other specified congenital malformations of skin: Secondary | ICD-10-CM

## 2021-05-09 MED ORDER — CETIRIZINE HCL 10 MG PO TABS: 10.0000 mg | ORAL_TABLET | Freq: Every day | ORAL | 0 refills | Status: DC | PRN

## 2021-05-09 MED ORDER — EPINEPHRINE 0.3 MG/0.3ML IJ SOAJ: 0.3000 mg | INTRAMUSCULAR | 0 refills | Status: DC | PRN

## 2021-05-09 MED ORDER — CETIRIZINE HCL 5 MG/5ML PO SOLN
10.0000 mg | Freq: Once | ORAL | Status: AC
  Administered 2021-05-09: 10 mg via ORAL
  Filled 2021-05-09 (×2): qty 10

## 2021-05-09 NOTE — ED Triage Notes (Signed)
Pt via pov from home with apparent allergic reaction. Pt states she saw her podiatrist today and had a plantar wart removed. She reports that the podiatrist sprayed the wound with some sort of chemical that may have reacted with medication she is taking for her melanoma. Symptoms: diarrhea, stomach pain, sob, shaking, itching, burning, tingling "all over my body." Pt is very shaky and obviously uncomfortable during triage.

## 2021-05-09 NOTE — Progress Notes (Signed)
Subjective:   Patient ID: Claire Wall, female   DOB: 70 y.o.   MRN: [id number]   HPI Patient states she is developed a lesion on the bottom of her right foot over the last couple weeks and she is very nervous that she has melanoma on her right lower leg that has gotten into the lymph nodes of her thigh.  She is due to have treatment for that and had resection and states this lesion gets painful when she walks.  Patient does not smoke likes to be active   Review of Systems  All other systems reviewed and are negative.       Objective:  Physical Exam Vitals and nursing note reviewed.  Constitutional:      Appearance: She is well-developed.  Pulmonary:     Effort: Pulmonary effort is normal.  Musculoskeletal:        General: Normal range of motion.  Skin:    General: Skin is warm.  Neurological:     Mental Status: She is alert.     Neurovascular status intact muscle strength adequate range of motion adequate.  Patient has a lesion plantar aspect right foot measuring around 4 x 4 millimeter that upon debridement shows pinpoint bleeding pain to pressure and is located on the lateral distal aspect of the foot.  Patient has good digital perfusion well oriented x3     Assessment:  Probability for verruca plantaris plantar right versus porokeratosis or other type lesion but does not appear to have any type of malignancy associated with it     Plan:  H&P discussed condition debridement accomplished apply chemical agent to create immune response with sterile dressing and advised what to do if any blistering were to occur.  Reappoint if symptoms indicate

## 2021-05-09 NOTE — ED Provider Notes (Signed)
[facility] EMERGENCY DEPT Provider Note   CSN: [id number] Arrival date & time: 05/09/21  1740     History Chief Complaint  Patient presents with  . Allergic Reaction    Claire Wall is a 70 y.o. female.  HPI Patient presents with potential allergic reaction.  This morning had potentially a plantar wart removed off her foot by podiatry.  Reviewing records it appears some immunogenic medicine was used but I cannot see what it was.  Patient was doing well until 5:00 today when she began to feel bad.  Had some diarrhea.  States she began to get flushed all over.  Tingly.  Some abdominal pain.  States the rash developed on her hands and then all of her body.  No difficulty breathing.  States she is feeling a little better now.  Has not taken any medicine.  Is on TVEC for melanoma from her right calf.  Last dose was around 2 weeks ago.  Melanoma has spread to the thigh and now they are waiting on oncology to figure out the next step.    Past Medical History:  Diagnosis Date  . Arthritis    knees, shoulder  . Cancer ([place]) 08/2019   skin - melanoma right calf  . Chest pain 2015   Resolved per patient - anxiety/stress related to work, now retired, no current problem   . Hyperlipidemia   . Hypertension    resolved per patient, no problems since patient retired, had a stressful job.  . Pneumonia    x 1    Patient Active Problem List   Diagnosis Date Noted  . HTN (hypertension) 03/15/2014  . Chest pain     Past Surgical History:  Procedure Laterality Date  . APPENDECTOMY    . bladder tack  2018   at [facility]   . COLONOSCOPY     polyps  . ELBOW SURGERY Bilateral    x 2  . FOOT SURGERY Left    bunion  . MELANOMA EXCISION Right 09/18/2020   Procedure: WIDE LOCAL EXICISION WITH ADVANCEMENT FLAP CLOSURE RIGHT CALF MELANOMA;  Surgeon: Stark Klein, MD;  Location: [facility];  Service: General;  Laterality: Right;  . MELANOMA EXCISION WITH SENTINEL  LYMPH NODE BIOPSY Right 10/13/2019   Procedure: WIDE LOCAL EXCISION WITH ADVANCEMENT FLAP CLOSURE AND SENTINEL LYMPH NODE BIOPSY;  Surgeon: Stark Klein, MD;  Location: [facility];  Service: General;  Laterality: Right;  . WISDOM TOOTH EXTRACTION       OB History   No obstetric history on file.     History reviewed. No pertinent family history.  Social History   Tobacco Use  . Smoking status: Never Smoker  . Smokeless tobacco: Never Used  Vaping Use  . Vaping Use: Never used  Substance Use Topics  . Alcohol use: Yes    Comment: 1 glass wine daily  . Drug use: No    Home Medications Prior to Admission medications   Medication Sig Start Date End Date Taking? Authorizing Provider  cetirizine (ZYRTEC ALLERGY) 10 MG tablet Take 1 tablet (10 mg total) by mouth daily as needed for allergies. 05/09/21  Yes Davonna Belling, MD  EPINEPHrine 0.3 mg/0.3 mL IJ SOAJ injection Inject 0.3 mg into the muscle as needed for anaphylaxis. 05/09/21  Yes Davonna Belling, MD  Cholecalciferol (VITAMIN D) 125 MCG (5000 UT) CAPS Take 5,000 Units by mouth daily.    [provider]  Coenzyme Q10 (CO Q-10) 100 MG  CAPS Take 100 mg by mouth daily.     [provider]  meloxicam (MOBIC) 7.5 MG tablet Take 7.5 mg by mouth daily as needed for pain.    [provider]  Omega-3 Fatty Acids (FISH OIL TRIPLE STRENGTH PO) Take 2,400 mg by mouth daily.     [provider]  simvastatin (ZOCOR) 40 MG tablet Take 40 mg by mouth daily.  01/21/14   [provider]    Allergies    Codeine  Review of Systems   Review of Systems  Constitutional: Negative for appetite change.  HENT: Negative for congestion.   Gastrointestinal: Positive for diarrhea.  Musculoskeletal: Negative for back pain.  Skin: Positive for rash.  Neurological: Negative for weakness.  Psychiatric/Behavioral: Negative for confusion.    Physical Exam Updated Vital Signs BP 135/73 (BP Location: Right Arm)    Pulse 62   Temp 98.4 F (36.9 C) (Oral)   Resp 11   Ht 5\' 10"  (1.778 m)   Wt 76.7 kg   LMP  (LMP Unknown)   SpO2 96%   BMI 24.25 kg/m   Physical Exam Vitals reviewed.  HENT:     Head: Atraumatic.     Nose: No rhinorrhea.     Mouth/Throat:     Mouth: Mucous membranes are moist.     Pharynx: No oropharyngeal exudate or posterior oropharyngeal erythema.  Eyes:     Pupils: Pupils are equal, round, and reactive to light.  Pulmonary:     Breath sounds: No wheezing, rhonchi or rales.  Abdominal:     Tenderness: There is no abdominal tenderness.     Hernia: No hernia is present.  Musculoskeletal:        General: No tenderness.     Cervical back: Neck supple.  Skin:    General: Skin is warm.     Comments: Erythematous rash diffusely.  Really does not involve her feet much however.  At the sole of the right foot has a bandage over the site of the biopsy.  No erythema at the site.  Neurological:     General: No focal deficit present.     Mental Status: She is alert.     ED Results / Procedures / Treatments   Labs (all labs ordered are listed, but only abnormal results are displayed) Labs Reviewed - No data to display  EKG None  Radiology No results found.  Procedures Procedures   Medications Ordered in ED Medications  cetirizine HCl (Zyrtec) 5 MG/5ML solution 10 mg (10 mg Oral Given 05/09/21 1828)    ED Course  I have reviewed the triage vital signs and the nursing notes.  Pertinent labs & imaging results that were available during my care of the patient were reviewed by me and considered in my medical decision making (see chart for details).    MDM Rules/Calculators/A&P                          Patient presents with some sort of reaction.  Potentially allergic but also potentially just neurologic after what ever immunogenic treatment she had done on her foot potentially possibly triggered by her immunotherapy she is getting for her melanoma.  While here  patient is improved.  Had some Zyrtec.  Rash is improved.  Has not involved airway.  I think patient is stable for discharge.  No steroids since it can interfere with her treatment.  Given prescription for EpiPen in case another  reaction is worse.  Zyrtec prescription also.  Discharge home with outpatient follow-up as needed.  Final Clinical Impression(s) / ED Diagnoses Final diagnoses:  Allergic reaction, initial encounter    Rx / DC Orders ED Discharge Orders         Ordered    cetirizine (ZYRTEC ALLERGY) 10 MG tablet  Daily PRN        05/09/21 1926    EPINEPHrine 0.3 mg/0.3 mL IJ SOAJ injection  As needed        05/09/21 1926           Davonna Belling, MD 05/09/21 [phone]

## 2021-05-09 NOTE — Discharge Instructions (Addendum)
You had some sort of immune response.  Potentially could be an allergic reaction.  Take the Zyrtec as needed once a day.  You have been given a prescription for an EpiPen also for a severe reaction.  Follow-up with your doctors.

## 2021-05-10 ENCOUNTER — Ambulatory Visit: Payer: PPO | Admitting: Oncology

## 2021-05-10 DIAGNOSIS — C4371 Malignant melanoma of right lower limb, including hip: Secondary | ICD-10-CM | POA: Diagnosis not present

## 2021-05-14 ENCOUNTER — Telehealth: Payer: Self-pay | Admitting: *Deleted

## 2021-05-14 NOTE — Telephone Encounter (Signed)
Patient would like to know the chemical agent that was put on a treated wart during last office visit. She had a severe reaction the day afterwards and had to got to the ER(report in epic),would like it notated in her chart for future references.

## 2021-05-15 DIAGNOSIS — W19XXXA Unspecified fall, initial encounter: Secondary | ICD-10-CM | POA: Diagnosis not present

## 2021-05-15 DIAGNOSIS — W57XXXA Bitten or stung by nonvenomous insect and other nonvenomous arthropods, initial encounter: Secondary | ICD-10-CM | POA: Diagnosis not present

## 2021-05-15 DIAGNOSIS — R030 Elevated blood-pressure reading, without diagnosis of hypertension: Secondary | ICD-10-CM | POA: Diagnosis not present

## 2021-05-15 DIAGNOSIS — S0990XA Unspecified injury of head, initial encounter: Secondary | ICD-10-CM | POA: Diagnosis not present

## 2021-05-15 DIAGNOSIS — S20462A Insect bite (nonvenomous) of left back wall of thorax, initial encounter: Secondary | ICD-10-CM | POA: Diagnosis not present

## 2021-05-15 NOTE — Telephone Encounter (Signed)
Creates immune reaction and is good sign to see reaction. Very rare to be that intense and does not have to be used again

## 2021-05-17 DIAGNOSIS — C4371 Malignant melanoma of right lower limb, including hip: Secondary | ICD-10-CM | POA: Diagnosis not present

## 2021-05-17 DIAGNOSIS — Z79899 Other long term (current) drug therapy: Secondary | ICD-10-CM | POA: Diagnosis not present

## 2021-05-21 DIAGNOSIS — Z01419 Encounter for gynecological examination (general) (routine) without abnormal findings: Secondary | ICD-10-CM | POA: Diagnosis not present

## 2021-05-21 DIAGNOSIS — Z6824 Body mass index (BMI) 24.0-24.9, adult: Secondary | ICD-10-CM | POA: Diagnosis not present

## 2021-05-21 DIAGNOSIS — Z124 Encounter for screening for malignant neoplasm of cervix: Secondary | ICD-10-CM | POA: Diagnosis not present

## 2021-05-26 DIAGNOSIS — Z5112 Encounter for antineoplastic immunotherapy: Secondary | ICD-10-CM | POA: Diagnosis not present

## 2021-05-26 DIAGNOSIS — Z79899 Other long term (current) drug therapy: Secondary | ICD-10-CM | POA: Diagnosis not present

## 2021-05-26 DIAGNOSIS — C4371 Malignant melanoma of right lower limb, including hip: Secondary | ICD-10-CM | POA: Diagnosis not present

## 2021-05-27 ENCOUNTER — Telehealth: Payer: Self-pay | Admitting: Podiatry

## 2021-05-27 NOTE — Telephone Encounter (Signed)
Claire Wall would like her medical records updated with the type of chemical agent that she was given, when she was seen. She had a allergic reaction to what ever she was given and ended up in the Emergency Dept. she would like to have that listed on her medical records, ASAP.

## 2021-05-29 ENCOUNTER — Telehealth: Payer: Self-pay

## 2021-05-29 ENCOUNTER — Telehealth: Payer: Self-pay | Admitting: *Deleted

## 2021-05-29 ENCOUNTER — Encounter: Payer: Self-pay | Admitting: Podiatry

## 2021-05-29 NOTE — Telephone Encounter (Signed)
Patient is calling and would like the name of the chemical agent of used for the treatment of a Plantar's wart,causing an allergic reaction. Please document in her chart.

## 2021-05-29 NOTE — Telephone Encounter (Signed)
Pt called to discuss possible allergic reaction to [facility] after application for a plantar wart. Pt confirmed ED symptoms of rash and GI upset. Pt told the medication used was Cantharone. Pt states understanding and had no other questions or concerns at this time.

## 2021-05-29 NOTE — Telephone Encounter (Signed)
Canthacur. I've never heard of that type of reaction. The only reaction to this medicine is local

## 2021-05-30 DIAGNOSIS — M25571 Pain in right ankle and joints of right foot: Secondary | ICD-10-CM | POA: Diagnosis not present

## 2021-06-03 ENCOUNTER — Other Ambulatory Visit: Payer: Self-pay

## 2021-06-03 ENCOUNTER — Ambulatory Visit
Admission: RE | Admit: 2021-06-03 | Discharge: 2021-06-03 | Disposition: A | Discharge status: Home / Self Care | Payer: PPO | Source: Ambulatory Visit

## 2021-06-03 DIAGNOSIS — Z1231 Encounter for screening mammogram for malignant neoplasm of breast: Secondary | ICD-10-CM | POA: Diagnosis not present

## 2021-06-10 DIAGNOSIS — M545 Low back pain, unspecified: Secondary | ICD-10-CM | POA: Diagnosis not present

## 2021-06-13 DIAGNOSIS — M545 Low back pain, unspecified: Secondary | ICD-10-CM | POA: Diagnosis not present

## 2021-06-14 DIAGNOSIS — E7800 Pure hypercholesterolemia, unspecified: Secondary | ICD-10-CM | POA: Diagnosis not present

## 2021-06-14 DIAGNOSIS — I10 Essential (primary) hypertension: Secondary | ICD-10-CM | POA: Diagnosis not present

## 2021-06-18 DIAGNOSIS — M545 Low back pain, unspecified: Secondary | ICD-10-CM | POA: Diagnosis not present

## 2021-06-21 DIAGNOSIS — Z1339 Encounter for screening examination for other mental health and behavioral disorders: Secondary | ICD-10-CM | POA: Diagnosis not present

## 2021-06-21 DIAGNOSIS — R82998 Other abnormal findings in urine: Secondary | ICD-10-CM | POA: Diagnosis not present

## 2021-06-21 DIAGNOSIS — M8580 Other specified disorders of bone density and structure, unspecified site: Secondary | ICD-10-CM | POA: Diagnosis not present

## 2021-06-21 DIAGNOSIS — I10 Essential (primary) hypertension: Secondary | ICD-10-CM | POA: Diagnosis not present

## 2021-06-21 DIAGNOSIS — Z1212 Encounter for screening for malignant neoplasm of rectum: Secondary | ICD-10-CM | POA: Diagnosis not present

## 2021-06-21 DIAGNOSIS — Z1331 Encounter for screening for depression: Secondary | ICD-10-CM | POA: Diagnosis not present

## 2021-06-21 DIAGNOSIS — E7800 Pure hypercholesterolemia, unspecified: Secondary | ICD-10-CM | POA: Diagnosis not present

## 2021-06-21 DIAGNOSIS — D72819 Decreased white blood cell count, unspecified: Secondary | ICD-10-CM | POA: Diagnosis not present

## 2021-06-21 DIAGNOSIS — C4371 Malignant melanoma of right lower limb, including hip: Secondary | ICD-10-CM | POA: Diagnosis not present

## 2021-06-21 DIAGNOSIS — M545 Low back pain, unspecified: Secondary | ICD-10-CM | POA: Diagnosis not present

## 2021-06-21 DIAGNOSIS — E875 Hyperkalemia: Secondary | ICD-10-CM | POA: Diagnosis not present

## 2021-06-21 DIAGNOSIS — Z0000 Encounter for general adult medical examination without abnormal findings: Secondary | ICD-10-CM | POA: Diagnosis not present

## 2021-06-21 DIAGNOSIS — Z23 Encounter for immunization: Secondary | ICD-10-CM | POA: Diagnosis not present

## 2021-06-25 DIAGNOSIS — C799 Secondary malignant neoplasm of unspecified site: Secondary | ICD-10-CM | POA: Diagnosis not present

## 2021-06-25 DIAGNOSIS — C4371 Malignant melanoma of right lower limb, including hip: Secondary | ICD-10-CM | POA: Diagnosis not present

## 2021-06-25 DIAGNOSIS — Z5112 Encounter for antineoplastic immunotherapy: Secondary | ICD-10-CM | POA: Diagnosis not present

## 2021-06-25 DIAGNOSIS — Z79899 Other long term (current) drug therapy: Secondary | ICD-10-CM | POA: Diagnosis not present

## 2021-06-27 DIAGNOSIS — M545 Low back pain, unspecified: Secondary | ICD-10-CM | POA: Diagnosis not present

## 2021-07-02 DIAGNOSIS — M545 Low back pain, unspecified: Secondary | ICD-10-CM | POA: Diagnosis not present

## 2021-07-08 DIAGNOSIS — M545 Low back pain, unspecified: Secondary | ICD-10-CM | POA: Diagnosis not present

## 2021-07-11 DIAGNOSIS — M791 Myalgia, unspecified site: Secondary | ICD-10-CM | POA: Diagnosis not present

## 2021-07-11 DIAGNOSIS — M9905 Segmental and somatic dysfunction of pelvic region: Secondary | ICD-10-CM | POA: Diagnosis not present

## 2021-07-11 DIAGNOSIS — M9902 Segmental and somatic dysfunction of thoracic region: Secondary | ICD-10-CM | POA: Diagnosis not present

## 2021-07-11 DIAGNOSIS — M9903 Segmental and somatic dysfunction of lumbar region: Secondary | ICD-10-CM | POA: Diagnosis not present

## 2021-07-15 DIAGNOSIS — M545 Low back pain, unspecified: Secondary | ICD-10-CM | POA: Diagnosis not present

## 2021-07-19 DIAGNOSIS — C4371 Malignant melanoma of right lower limb, including hip: Secondary | ICD-10-CM | POA: Diagnosis not present

## 2021-07-19 DIAGNOSIS — Z79899 Other long term (current) drug therapy: Secondary | ICD-10-CM | POA: Diagnosis not present

## 2021-07-19 DIAGNOSIS — Z5112 Encounter for antineoplastic immunotherapy: Secondary | ICD-10-CM | POA: Diagnosis not present

## 2021-07-22 DIAGNOSIS — M545 Low back pain, unspecified: Secondary | ICD-10-CM | POA: Diagnosis not present

## 2021-07-23 DIAGNOSIS — M799 Soft tissue disorder, unspecified: Secondary | ICD-10-CM | POA: Diagnosis not present

## 2021-07-23 DIAGNOSIS — C439 Malignant melanoma of skin, unspecified: Secondary | ICD-10-CM | POA: Diagnosis not present

## 2021-07-23 DIAGNOSIS — Z5112 Encounter for antineoplastic immunotherapy: Secondary | ICD-10-CM | POA: Diagnosis not present

## 2021-07-23 DIAGNOSIS — C4371 Malignant melanoma of right lower limb, including hip: Secondary | ICD-10-CM | POA: Diagnosis not present

## 2021-07-23 DIAGNOSIS — C799 Secondary malignant neoplasm of unspecified site: Secondary | ICD-10-CM | POA: Diagnosis not present

## 2021-07-29 DIAGNOSIS — M545 Low back pain, unspecified: Secondary | ICD-10-CM | POA: Diagnosis not present

## 2021-07-30 DIAGNOSIS — L821 Other seborrheic keratosis: Secondary | ICD-10-CM | POA: Diagnosis not present

## 2021-07-30 DIAGNOSIS — Z85820 Personal history of malignant melanoma of skin: Secondary | ICD-10-CM | POA: Diagnosis not present

## 2021-07-30 DIAGNOSIS — C4371 Malignant melanoma of right lower limb, including hip: Secondary | ICD-10-CM | POA: Diagnosis not present

## 2021-07-30 DIAGNOSIS — Z85828 Personal history of other malignant neoplasm of skin: Secondary | ICD-10-CM | POA: Diagnosis not present

## 2021-07-30 DIAGNOSIS — D2272 Melanocytic nevi of left lower limb, including hip: Secondary | ICD-10-CM | POA: Diagnosis not present

## 2021-07-30 DIAGNOSIS — L814 Other melanin hyperpigmentation: Secondary | ICD-10-CM | POA: Diagnosis not present

## 2021-07-30 DIAGNOSIS — D1801 Hemangioma of skin and subcutaneous tissue: Secondary | ICD-10-CM | POA: Diagnosis not present

## 2021-07-31 DIAGNOSIS — E7800 Pure hypercholesterolemia, unspecified: Secondary | ICD-10-CM | POA: Diagnosis not present

## 2021-07-31 DIAGNOSIS — Z510 Encounter for antineoplastic radiation therapy: Secondary | ICD-10-CM | POA: Diagnosis not present

## 2021-07-31 DIAGNOSIS — C4371 Malignant melanoma of right lower limb, including hip: Secondary | ICD-10-CM | POA: Diagnosis not present

## 2021-07-31 DIAGNOSIS — C439 Malignant melanoma of skin, unspecified: Secondary | ICD-10-CM | POA: Diagnosis not present

## 2021-07-31 DIAGNOSIS — C7989 Secondary malignant neoplasm of other specified sites: Secondary | ICD-10-CM | POA: Diagnosis not present

## 2021-07-31 DIAGNOSIS — Z885 Allergy status to narcotic agent status: Secondary | ICD-10-CM | POA: Diagnosis not present

## 2021-08-06 DIAGNOSIS — H9202 Otalgia, left ear: Secondary | ICD-10-CM | POA: Diagnosis not present

## 2021-08-06 DIAGNOSIS — H9222 Otorrhagia, left ear: Secondary | ICD-10-CM | POA: Diagnosis not present

## 2021-08-08 DIAGNOSIS — M791 Myalgia, unspecified site: Secondary | ICD-10-CM | POA: Diagnosis not present

## 2021-08-08 DIAGNOSIS — M9902 Segmental and somatic dysfunction of thoracic region: Secondary | ICD-10-CM | POA: Diagnosis not present

## 2021-08-08 DIAGNOSIS — M9905 Segmental and somatic dysfunction of pelvic region: Secondary | ICD-10-CM | POA: Diagnosis not present

## 2021-08-08 DIAGNOSIS — M9903 Segmental and somatic dysfunction of lumbar region: Secondary | ICD-10-CM | POA: Diagnosis not present

## 2021-08-09 DIAGNOSIS — M545 Low back pain, unspecified: Secondary | ICD-10-CM | POA: Diagnosis not present

## 2021-08-13 DIAGNOSIS — C4371 Malignant melanoma of right lower limb, including hip: Secondary | ICD-10-CM | POA: Diagnosis not present

## 2021-08-13 DIAGNOSIS — C4370 Malignant melanoma of unspecified lower limb, including hip: Secondary | ICD-10-CM | POA: Diagnosis not present

## 2021-08-14 DIAGNOSIS — M545 Low back pain, unspecified: Secondary | ICD-10-CM | POA: Diagnosis not present

## 2021-08-19 DIAGNOSIS — C4371 Malignant melanoma of right lower limb, including hip: Secondary | ICD-10-CM | POA: Diagnosis not present

## 2021-08-20 DIAGNOSIS — M545 Low back pain, unspecified: Secondary | ICD-10-CM | POA: Diagnosis not present

## 2021-08-22 DIAGNOSIS — E7800 Pure hypercholesterolemia, unspecified: Secondary | ICD-10-CM | POA: Diagnosis not present

## 2021-08-22 DIAGNOSIS — Z510 Encounter for antineoplastic radiation therapy: Secondary | ICD-10-CM | POA: Diagnosis not present

## 2021-08-22 DIAGNOSIS — Z885 Allergy status to narcotic agent status: Secondary | ICD-10-CM | POA: Diagnosis not present

## 2021-08-22 DIAGNOSIS — C4371 Malignant melanoma of right lower limb, including hip: Secondary | ICD-10-CM | POA: Diagnosis not present

## 2021-08-22 DIAGNOSIS — C7989 Secondary malignant neoplasm of other specified sites: Secondary | ICD-10-CM | POA: Diagnosis not present

## 2021-08-23 DIAGNOSIS — M545 Low back pain, unspecified: Secondary | ICD-10-CM | POA: Diagnosis not present

## 2021-08-27 DIAGNOSIS — C4371 Malignant melanoma of right lower limb, including hip: Secondary | ICD-10-CM | POA: Diagnosis not present

## 2021-08-28 DIAGNOSIS — M545 Low back pain, unspecified: Secondary | ICD-10-CM | POA: Diagnosis not present

## 2021-08-29 DIAGNOSIS — C4371 Malignant melanoma of right lower limb, including hip: Secondary | ICD-10-CM | POA: Diagnosis not present

## 2021-08-30 DIAGNOSIS — C4371 Malignant melanoma of right lower limb, including hip: Secondary | ICD-10-CM | POA: Diagnosis not present

## 2021-08-30 DIAGNOSIS — D485 Neoplasm of uncertain behavior of skin: Secondary | ICD-10-CM | POA: Diagnosis not present

## 2021-09-02 DIAGNOSIS — C7989 Secondary malignant neoplasm of other specified sites: Secondary | ICD-10-CM | POA: Diagnosis not present

## 2021-09-02 DIAGNOSIS — C4371 Malignant melanoma of right lower limb, including hip: Secondary | ICD-10-CM | POA: Diagnosis not present

## 2021-09-09 DIAGNOSIS — M545 Low back pain, unspecified: Secondary | ICD-10-CM | POA: Diagnosis not present

## 2021-09-11 DIAGNOSIS — C439 Malignant melanoma of skin, unspecified: Secondary | ICD-10-CM | POA: Diagnosis not present

## 2021-09-11 DIAGNOSIS — C4371 Malignant melanoma of right lower limb, including hip: Secondary | ICD-10-CM | POA: Diagnosis not present

## 2021-09-11 DIAGNOSIS — Z5112 Encounter for antineoplastic immunotherapy: Secondary | ICD-10-CM | POA: Diagnosis not present

## 2021-09-16 DIAGNOSIS — D2362 Other benign neoplasm of skin of left upper limb, including shoulder: Secondary | ICD-10-CM | POA: Diagnosis not present

## 2021-09-16 DIAGNOSIS — Z85820 Personal history of malignant melanoma of skin: Secondary | ICD-10-CM | POA: Diagnosis not present

## 2021-09-16 DIAGNOSIS — Z85828 Personal history of other malignant neoplasm of skin: Secondary | ICD-10-CM | POA: Diagnosis not present

## 2021-09-16 DIAGNOSIS — M545 Low back pain, unspecified: Secondary | ICD-10-CM | POA: Diagnosis not present

## 2021-09-20 DIAGNOSIS — H6690 Otitis media, unspecified, unspecified ear: Secondary | ICD-10-CM | POA: Diagnosis not present

## 2021-09-21 DIAGNOSIS — D485 Neoplasm of uncertain behavior of skin: Secondary | ICD-10-CM | POA: Diagnosis not present

## 2021-09-21 DIAGNOSIS — C003 Malignant neoplasm of upper lip, inner aspect: Secondary | ICD-10-CM | POA: Diagnosis not present

## 2021-09-23 DIAGNOSIS — M545 Low back pain, unspecified: Secondary | ICD-10-CM | POA: Diagnosis not present

## 2021-09-24 DIAGNOSIS — C499 Malignant neoplasm of connective and soft tissue, unspecified: Secondary | ICD-10-CM | POA: Diagnosis not present

## 2021-09-24 DIAGNOSIS — Z006 Encounter for examination for normal comparison and control in clinical research program: Secondary | ICD-10-CM | POA: Diagnosis not present

## 2021-09-24 DIAGNOSIS — C4371 Malignant melanoma of right lower limb, including hip: Secondary | ICD-10-CM | POA: Diagnosis not present

## 2021-10-03 DIAGNOSIS — C003 Malignant neoplasm of upper lip, inner aspect: Secondary | ICD-10-CM | POA: Diagnosis not present

## 2021-10-03 DIAGNOSIS — D485 Neoplasm of uncertain behavior of skin: Secondary | ICD-10-CM | POA: Diagnosis not present

## 2021-10-22 DIAGNOSIS — C4371 Malignant melanoma of right lower limb, including hip: Secondary | ICD-10-CM | POA: Diagnosis not present

## 2021-10-22 DIAGNOSIS — Z5111 Encounter for antineoplastic chemotherapy: Secondary | ICD-10-CM | POA: Diagnosis not present

## 2021-10-24 DIAGNOSIS — M5136 Other intervertebral disc degeneration, lumbar region: Secondary | ICD-10-CM | POA: Diagnosis not present

## 2021-10-24 DIAGNOSIS — Z6824 Body mass index (BMI) 24.0-24.9, adult: Secondary | ICD-10-CM | POA: Diagnosis not present

## 2021-10-24 DIAGNOSIS — M545 Low back pain, unspecified: Secondary | ICD-10-CM | POA: Diagnosis not present

## 2021-10-24 DIAGNOSIS — R030 Elevated blood-pressure reading, without diagnosis of hypertension: Secondary | ICD-10-CM | POA: Diagnosis not present

## 2021-10-28 DIAGNOSIS — M545 Low back pain, unspecified: Secondary | ICD-10-CM | POA: Diagnosis not present

## 2021-10-29 DIAGNOSIS — M9905 Segmental and somatic dysfunction of pelvic region: Secondary | ICD-10-CM | POA: Diagnosis not present

## 2021-10-29 DIAGNOSIS — M9903 Segmental and somatic dysfunction of lumbar region: Secondary | ICD-10-CM | POA: Diagnosis not present

## 2021-10-29 DIAGNOSIS — M9902 Segmental and somatic dysfunction of thoracic region: Secondary | ICD-10-CM | POA: Diagnosis not present

## 2021-10-29 DIAGNOSIS — M791 Myalgia, unspecified site: Secondary | ICD-10-CM | POA: Diagnosis not present

## 2021-11-04 DIAGNOSIS — M545 Low back pain, unspecified: Secondary | ICD-10-CM | POA: Diagnosis not present

## 2021-11-08 DIAGNOSIS — M545 Low back pain, unspecified: Secondary | ICD-10-CM | POA: Diagnosis not present

## 2021-11-11 DIAGNOSIS — C499 Malignant neoplasm of connective and soft tissue, unspecified: Secondary | ICD-10-CM | POA: Diagnosis not present

## 2021-11-11 DIAGNOSIS — C4371 Malignant melanoma of right lower limb, including hip: Secondary | ICD-10-CM | POA: Diagnosis not present

## 2021-11-11 DIAGNOSIS — Z006 Encounter for examination for normal comparison and control in clinical research program: Secondary | ICD-10-CM | POA: Diagnosis not present

## 2021-11-13 DIAGNOSIS — M545 Low back pain, unspecified: Secondary | ICD-10-CM | POA: Diagnosis not present

## 2021-11-19 DIAGNOSIS — M545 Low back pain, unspecified: Secondary | ICD-10-CM | POA: Diagnosis not present

## 2021-11-25 DIAGNOSIS — D1801 Hemangioma of skin and subcutaneous tissue: Secondary | ICD-10-CM | POA: Diagnosis not present

## 2021-11-25 DIAGNOSIS — L821 Other seborrheic keratosis: Secondary | ICD-10-CM | POA: Diagnosis not present

## 2021-11-25 DIAGNOSIS — Z85820 Personal history of malignant melanoma of skin: Secondary | ICD-10-CM | POA: Diagnosis not present

## 2021-11-25 DIAGNOSIS — L812 Freckles: Secondary | ICD-10-CM | POA: Diagnosis not present

## 2021-11-25 DIAGNOSIS — D225 Melanocytic nevi of trunk: Secondary | ICD-10-CM | POA: Diagnosis not present

## 2021-11-25 DIAGNOSIS — D485 Neoplasm of uncertain behavior of skin: Secondary | ICD-10-CM | POA: Diagnosis not present

## 2021-11-25 DIAGNOSIS — Z85828 Personal history of other malignant neoplasm of skin: Secondary | ICD-10-CM | POA: Diagnosis not present

## 2021-11-26 DIAGNOSIS — M545 Low back pain, unspecified: Secondary | ICD-10-CM | POA: Diagnosis not present

## 2021-12-06 DIAGNOSIS — C4371 Malignant melanoma of right lower limb, including hip: Secondary | ICD-10-CM | POA: Diagnosis not present

## 2021-12-06 DIAGNOSIS — Z5112 Encounter for antineoplastic immunotherapy: Secondary | ICD-10-CM | POA: Diagnosis not present

## 2021-12-06 DIAGNOSIS — Z006 Encounter for examination for normal comparison and control in clinical research program: Secondary | ICD-10-CM | POA: Diagnosis not present

## 2021-12-10 DIAGNOSIS — M545 Low back pain, unspecified: Secondary | ICD-10-CM | POA: Diagnosis not present

## 2021-12-13 DIAGNOSIS — M545 Low back pain, unspecified: Secondary | ICD-10-CM | POA: Diagnosis not present

## 2021-12-17 DIAGNOSIS — M545 Low back pain, unspecified: Secondary | ICD-10-CM | POA: Diagnosis not present

## 2021-12-20 DIAGNOSIS — M545 Low back pain, unspecified: Secondary | ICD-10-CM | POA: Diagnosis not present

## 2021-12-24 DIAGNOSIS — C4371 Malignant melanoma of right lower limb, including hip: Secondary | ICD-10-CM | POA: Diagnosis not present

## 2021-12-24 DIAGNOSIS — C499 Malignant neoplasm of connective and soft tissue, unspecified: Secondary | ICD-10-CM | POA: Diagnosis not present

## 2021-12-24 DIAGNOSIS — Z006 Encounter for examination for normal comparison and control in clinical research program: Secondary | ICD-10-CM | POA: Diagnosis not present

## 2021-12-26 DIAGNOSIS — M5136 Other intervertebral disc degeneration, lumbar region: Secondary | ICD-10-CM | POA: Diagnosis not present

## 2021-12-31 DIAGNOSIS — M25651 Stiffness of right hip, not elsewhere classified: Secondary | ICD-10-CM | POA: Diagnosis not present

## 2021-12-31 DIAGNOSIS — M25551 Pain in right hip: Secondary | ICD-10-CM | POA: Diagnosis not present

## 2021-12-31 DIAGNOSIS — R2689 Other abnormalities of gait and mobility: Secondary | ICD-10-CM | POA: Diagnosis not present

## 2021-12-31 DIAGNOSIS — M5136 Other intervertebral disc degeneration, lumbar region: Secondary | ICD-10-CM | POA: Diagnosis not present

## 2021-12-31 DIAGNOSIS — M4306 Spondylolysis, lumbar region: Secondary | ICD-10-CM | POA: Diagnosis not present

## 2022-01-02 DIAGNOSIS — M4306 Spondylolysis, lumbar region: Secondary | ICD-10-CM | POA: Diagnosis not present

## 2022-01-02 DIAGNOSIS — M25651 Stiffness of right hip, not elsewhere classified: Secondary | ICD-10-CM | POA: Diagnosis not present

## 2022-01-02 DIAGNOSIS — R2689 Other abnormalities of gait and mobility: Secondary | ICD-10-CM | POA: Diagnosis not present

## 2022-01-02 DIAGNOSIS — M25551 Pain in right hip: Secondary | ICD-10-CM | POA: Diagnosis not present

## 2022-01-03 DIAGNOSIS — C4371 Malignant melanoma of right lower limb, including hip: Secondary | ICD-10-CM | POA: Diagnosis not present

## 2022-01-03 DIAGNOSIS — M549 Dorsalgia, unspecified: Secondary | ICD-10-CM | POA: Diagnosis not present

## 2022-01-03 DIAGNOSIS — Z5112 Encounter for antineoplastic immunotherapy: Secondary | ICD-10-CM | POA: Diagnosis not present

## 2022-01-03 DIAGNOSIS — C439 Malignant melanoma of skin, unspecified: Secondary | ICD-10-CM | POA: Diagnosis not present

## 2022-01-07 DIAGNOSIS — M4306 Spondylolysis, lumbar region: Secondary | ICD-10-CM | POA: Diagnosis not present

## 2022-01-07 DIAGNOSIS — M25651 Stiffness of right hip, not elsewhere classified: Secondary | ICD-10-CM | POA: Diagnosis not present

## 2022-01-07 DIAGNOSIS — R2689 Other abnormalities of gait and mobility: Secondary | ICD-10-CM | POA: Diagnosis not present

## 2022-01-07 DIAGNOSIS — M25551 Pain in right hip: Secondary | ICD-10-CM | POA: Diagnosis not present

## 2022-01-09 DIAGNOSIS — M5136 Other intervertebral disc degeneration, lumbar region: Secondary | ICD-10-CM | POA: Diagnosis not present

## 2022-01-10 DIAGNOSIS — M25551 Pain in right hip: Secondary | ICD-10-CM | POA: Diagnosis not present

## 2022-01-10 DIAGNOSIS — R2689 Other abnormalities of gait and mobility: Secondary | ICD-10-CM | POA: Diagnosis not present

## 2022-01-10 DIAGNOSIS — M4306 Spondylolysis, lumbar region: Secondary | ICD-10-CM | POA: Diagnosis not present

## 2022-01-10 DIAGNOSIS — M25651 Stiffness of right hip, not elsewhere classified: Secondary | ICD-10-CM | POA: Diagnosis not present

## 2022-01-13 DIAGNOSIS — M9905 Segmental and somatic dysfunction of pelvic region: Secondary | ICD-10-CM | POA: Diagnosis not present

## 2022-01-13 DIAGNOSIS — M9902 Segmental and somatic dysfunction of thoracic region: Secondary | ICD-10-CM | POA: Diagnosis not present

## 2022-01-13 DIAGNOSIS — M9903 Segmental and somatic dysfunction of lumbar region: Secondary | ICD-10-CM | POA: Diagnosis not present

## 2022-01-13 DIAGNOSIS — M791 Myalgia, unspecified site: Secondary | ICD-10-CM | POA: Diagnosis not present

## 2022-01-14 DIAGNOSIS — M25551 Pain in right hip: Secondary | ICD-10-CM | POA: Diagnosis not present

## 2022-01-14 DIAGNOSIS — M4306 Spondylolysis, lumbar region: Secondary | ICD-10-CM | POA: Diagnosis not present

## 2022-01-14 DIAGNOSIS — R2689 Other abnormalities of gait and mobility: Secondary | ICD-10-CM | POA: Diagnosis not present

## 2022-01-14 DIAGNOSIS — M25651 Stiffness of right hip, not elsewhere classified: Secondary | ICD-10-CM | POA: Diagnosis not present

## 2022-01-16 DIAGNOSIS — M791 Myalgia, unspecified site: Secondary | ICD-10-CM | POA: Diagnosis not present

## 2022-01-16 DIAGNOSIS — M9902 Segmental and somatic dysfunction of thoracic region: Secondary | ICD-10-CM | POA: Diagnosis not present

## 2022-01-16 DIAGNOSIS — M9905 Segmental and somatic dysfunction of pelvic region: Secondary | ICD-10-CM | POA: Diagnosis not present

## 2022-01-16 DIAGNOSIS — M9903 Segmental and somatic dysfunction of lumbar region: Secondary | ICD-10-CM | POA: Diagnosis not present

## 2022-01-17 DIAGNOSIS — M4306 Spondylolysis, lumbar region: Secondary | ICD-10-CM | POA: Diagnosis not present

## 2022-01-17 DIAGNOSIS — R2689 Other abnormalities of gait and mobility: Secondary | ICD-10-CM | POA: Diagnosis not present

## 2022-01-17 DIAGNOSIS — M25551 Pain in right hip: Secondary | ICD-10-CM | POA: Diagnosis not present

## 2022-01-17 DIAGNOSIS — M25651 Stiffness of right hip, not elsewhere classified: Secondary | ICD-10-CM | POA: Diagnosis not present

## 2022-01-20 DIAGNOSIS — M4306 Spondylolysis, lumbar region: Secondary | ICD-10-CM | POA: Diagnosis not present

## 2022-01-20 DIAGNOSIS — M25551 Pain in right hip: Secondary | ICD-10-CM | POA: Diagnosis not present

## 2022-01-20 DIAGNOSIS — M25651 Stiffness of right hip, not elsewhere classified: Secondary | ICD-10-CM | POA: Diagnosis not present

## 2022-01-20 DIAGNOSIS — R2689 Other abnormalities of gait and mobility: Secondary | ICD-10-CM | POA: Diagnosis not present

## 2022-01-21 DIAGNOSIS — M9903 Segmental and somatic dysfunction of lumbar region: Secondary | ICD-10-CM | POA: Diagnosis not present

## 2022-01-21 DIAGNOSIS — M9905 Segmental and somatic dysfunction of pelvic region: Secondary | ICD-10-CM | POA: Diagnosis not present

## 2022-01-21 DIAGNOSIS — M791 Myalgia, unspecified site: Secondary | ICD-10-CM | POA: Diagnosis not present

## 2022-01-21 DIAGNOSIS — M9902 Segmental and somatic dysfunction of thoracic region: Secondary | ICD-10-CM | POA: Diagnosis not present

## 2022-01-23 DIAGNOSIS — M25551 Pain in right hip: Secondary | ICD-10-CM | POA: Diagnosis not present

## 2022-01-23 DIAGNOSIS — R2689 Other abnormalities of gait and mobility: Secondary | ICD-10-CM | POA: Diagnosis not present

## 2022-01-23 DIAGNOSIS — M4306 Spondylolysis, lumbar region: Secondary | ICD-10-CM | POA: Diagnosis not present

## 2022-01-23 DIAGNOSIS — M25651 Stiffness of right hip, not elsewhere classified: Secondary | ICD-10-CM | POA: Diagnosis not present

## 2022-01-24 DIAGNOSIS — Z5112 Encounter for antineoplastic immunotherapy: Secondary | ICD-10-CM | POA: Diagnosis not present

## 2022-01-24 DIAGNOSIS — C4371 Malignant melanoma of right lower limb, including hip: Secondary | ICD-10-CM | POA: Diagnosis not present

## 2022-01-24 DIAGNOSIS — Z006 Encounter for examination for normal comparison and control in clinical research program: Secondary | ICD-10-CM | POA: Diagnosis not present

## 2022-01-27 DIAGNOSIS — M4306 Spondylolysis, lumbar region: Secondary | ICD-10-CM | POA: Diagnosis not present

## 2022-01-27 DIAGNOSIS — R2689 Other abnormalities of gait and mobility: Secondary | ICD-10-CM | POA: Diagnosis not present

## 2022-01-27 DIAGNOSIS — M25651 Stiffness of right hip, not elsewhere classified: Secondary | ICD-10-CM | POA: Diagnosis not present

## 2022-01-27 DIAGNOSIS — M25551 Pain in right hip: Secondary | ICD-10-CM | POA: Diagnosis not present

## 2022-01-28 DIAGNOSIS — M9902 Segmental and somatic dysfunction of thoracic region: Secondary | ICD-10-CM | POA: Diagnosis not present

## 2022-01-28 DIAGNOSIS — M9903 Segmental and somatic dysfunction of lumbar region: Secondary | ICD-10-CM | POA: Diagnosis not present

## 2022-01-28 DIAGNOSIS — M9905 Segmental and somatic dysfunction of pelvic region: Secondary | ICD-10-CM | POA: Diagnosis not present

## 2022-01-28 DIAGNOSIS — M791 Myalgia, unspecified site: Secondary | ICD-10-CM | POA: Diagnosis not present

## 2022-01-29 DIAGNOSIS — M4306 Spondylolysis, lumbar region: Secondary | ICD-10-CM | POA: Diagnosis not present

## 2022-01-29 DIAGNOSIS — M25651 Stiffness of right hip, not elsewhere classified: Secondary | ICD-10-CM | POA: Diagnosis not present

## 2022-01-29 DIAGNOSIS — M25551 Pain in right hip: Secondary | ICD-10-CM | POA: Diagnosis not present

## 2022-01-29 DIAGNOSIS — R2689 Other abnormalities of gait and mobility: Secondary | ICD-10-CM | POA: Diagnosis not present

## 2022-01-30 DIAGNOSIS — C4371 Malignant melanoma of right lower limb, including hip: Secondary | ICD-10-CM | POA: Diagnosis not present

## 2022-02-03 DIAGNOSIS — R2689 Other abnormalities of gait and mobility: Secondary | ICD-10-CM | POA: Diagnosis not present

## 2022-02-03 DIAGNOSIS — M25651 Stiffness of right hip, not elsewhere classified: Secondary | ICD-10-CM | POA: Diagnosis not present

## 2022-02-03 DIAGNOSIS — M4306 Spondylolysis, lumbar region: Secondary | ICD-10-CM | POA: Diagnosis not present

## 2022-02-03 DIAGNOSIS — M25551 Pain in right hip: Secondary | ICD-10-CM | POA: Diagnosis not present

## 2022-02-04 DIAGNOSIS — J939 Pneumothorax, unspecified: Secondary | ICD-10-CM | POA: Diagnosis not present

## 2022-02-06 DIAGNOSIS — M25551 Pain in right hip: Secondary | ICD-10-CM | POA: Diagnosis not present

## 2022-02-06 DIAGNOSIS — M4306 Spondylolysis, lumbar region: Secondary | ICD-10-CM | POA: Diagnosis not present

## 2022-02-06 DIAGNOSIS — R2689 Other abnormalities of gait and mobility: Secondary | ICD-10-CM | POA: Diagnosis not present

## 2022-02-06 DIAGNOSIS — M25651 Stiffness of right hip, not elsewhere classified: Secondary | ICD-10-CM | POA: Diagnosis not present

## 2022-02-10 DIAGNOSIS — M25551 Pain in right hip: Secondary | ICD-10-CM | POA: Diagnosis not present

## 2022-02-10 DIAGNOSIS — M4306 Spondylolysis, lumbar region: Secondary | ICD-10-CM | POA: Diagnosis not present

## 2022-02-10 DIAGNOSIS — R2689 Other abnormalities of gait and mobility: Secondary | ICD-10-CM | POA: Diagnosis not present

## 2022-02-10 DIAGNOSIS — M25651 Stiffness of right hip, not elsewhere classified: Secondary | ICD-10-CM | POA: Diagnosis not present

## 2022-02-11 DIAGNOSIS — C4371 Malignant melanoma of right lower limb, including hip: Secondary | ICD-10-CM | POA: Diagnosis not present

## 2022-02-12 DIAGNOSIS — M25551 Pain in right hip: Secondary | ICD-10-CM | POA: Diagnosis not present

## 2022-02-12 DIAGNOSIS — R2689 Other abnormalities of gait and mobility: Secondary | ICD-10-CM | POA: Diagnosis not present

## 2022-02-12 DIAGNOSIS — M25651 Stiffness of right hip, not elsewhere classified: Secondary | ICD-10-CM | POA: Diagnosis not present

## 2022-02-12 DIAGNOSIS — M4306 Spondylolysis, lumbar region: Secondary | ICD-10-CM | POA: Diagnosis not present

## 2022-02-14 DIAGNOSIS — C4371 Malignant melanoma of right lower limb, including hip: Secondary | ICD-10-CM | POA: Diagnosis not present

## 2022-02-14 DIAGNOSIS — Z5112 Encounter for antineoplastic immunotherapy: Secondary | ICD-10-CM | POA: Diagnosis not present

## 2022-02-14 DIAGNOSIS — Z006 Encounter for examination for normal comparison and control in clinical research program: Secondary | ICD-10-CM | POA: Diagnosis not present

## 2022-02-17 DIAGNOSIS — M25551 Pain in right hip: Secondary | ICD-10-CM | POA: Diagnosis not present

## 2022-02-17 DIAGNOSIS — R2689 Other abnormalities of gait and mobility: Secondary | ICD-10-CM | POA: Diagnosis not present

## 2022-02-17 DIAGNOSIS — M25651 Stiffness of right hip, not elsewhere classified: Secondary | ICD-10-CM | POA: Diagnosis not present

## 2022-02-17 DIAGNOSIS — M4306 Spondylolysis, lumbar region: Secondary | ICD-10-CM | POA: Diagnosis not present

## 2022-02-18 DIAGNOSIS — M791 Myalgia, unspecified site: Secondary | ICD-10-CM | POA: Diagnosis not present

## 2022-02-18 DIAGNOSIS — M9903 Segmental and somatic dysfunction of lumbar region: Secondary | ICD-10-CM | POA: Diagnosis not present

## 2022-02-18 DIAGNOSIS — M9902 Segmental and somatic dysfunction of thoracic region: Secondary | ICD-10-CM | POA: Diagnosis not present

## 2022-02-18 DIAGNOSIS — M9905 Segmental and somatic dysfunction of pelvic region: Secondary | ICD-10-CM | POA: Diagnosis not present

## 2022-02-20 DIAGNOSIS — R2689 Other abnormalities of gait and mobility: Secondary | ICD-10-CM | POA: Diagnosis not present

## 2022-02-20 DIAGNOSIS — M5416 Radiculopathy, lumbar region: Secondary | ICD-10-CM | POA: Diagnosis not present

## 2022-02-20 DIAGNOSIS — R030 Elevated blood-pressure reading, without diagnosis of hypertension: Secondary | ICD-10-CM | POA: Diagnosis not present

## 2022-02-20 DIAGNOSIS — M5136 Other intervertebral disc degeneration, lumbar region: Secondary | ICD-10-CM | POA: Diagnosis not present

## 2022-02-20 DIAGNOSIS — M25651 Stiffness of right hip, not elsewhere classified: Secondary | ICD-10-CM | POA: Diagnosis not present

## 2022-02-20 DIAGNOSIS — M4306 Spondylolysis, lumbar region: Secondary | ICD-10-CM | POA: Diagnosis not present

## 2022-02-20 DIAGNOSIS — M25551 Pain in right hip: Secondary | ICD-10-CM | POA: Diagnosis not present

## 2022-02-25 DIAGNOSIS — M25651 Stiffness of right hip, not elsewhere classified: Secondary | ICD-10-CM | POA: Diagnosis not present

## 2022-02-25 DIAGNOSIS — M4306 Spondylolysis, lumbar region: Secondary | ICD-10-CM | POA: Diagnosis not present

## 2022-02-25 DIAGNOSIS — R2689 Other abnormalities of gait and mobility: Secondary | ICD-10-CM | POA: Diagnosis not present

## 2022-02-25 DIAGNOSIS — M25551 Pain in right hip: Secondary | ICD-10-CM | POA: Diagnosis not present

## 2022-02-27 DIAGNOSIS — Z85820 Personal history of malignant melanoma of skin: Secondary | ICD-10-CM | POA: Diagnosis not present

## 2022-02-27 DIAGNOSIS — D485 Neoplasm of uncertain behavior of skin: Secondary | ICD-10-CM | POA: Diagnosis not present

## 2022-02-27 DIAGNOSIS — Z85828 Personal history of other malignant neoplasm of skin: Secondary | ICD-10-CM | POA: Diagnosis not present

## 2022-02-27 DIAGNOSIS — L821 Other seborrheic keratosis: Secondary | ICD-10-CM | POA: Diagnosis not present

## 2022-02-27 DIAGNOSIS — L910 Hypertrophic scar: Secondary | ICD-10-CM | POA: Diagnosis not present

## 2022-02-27 DIAGNOSIS — L570 Actinic keratosis: Secondary | ICD-10-CM | POA: Diagnosis not present

## 2022-02-27 DIAGNOSIS — D225 Melanocytic nevi of trunk: Secondary | ICD-10-CM | POA: Diagnosis not present

## 2022-02-27 DIAGNOSIS — D1801 Hemangioma of skin and subcutaneous tissue: Secondary | ICD-10-CM | POA: Diagnosis not present

## 2022-02-27 DIAGNOSIS — L988 Other specified disorders of the skin and subcutaneous tissue: Secondary | ICD-10-CM | POA: Diagnosis not present

## 2022-02-28 DIAGNOSIS — M25551 Pain in right hip: Secondary | ICD-10-CM | POA: Diagnosis not present

## 2022-02-28 DIAGNOSIS — R2689 Other abnormalities of gait and mobility: Secondary | ICD-10-CM | POA: Diagnosis not present

## 2022-02-28 DIAGNOSIS — M4306 Spondylolysis, lumbar region: Secondary | ICD-10-CM | POA: Diagnosis not present

## 2022-02-28 DIAGNOSIS — M25651 Stiffness of right hip, not elsewhere classified: Secondary | ICD-10-CM | POA: Diagnosis not present

## 2022-03-03 DIAGNOSIS — R2689 Other abnormalities of gait and mobility: Secondary | ICD-10-CM | POA: Diagnosis not present

## 2022-03-03 DIAGNOSIS — M25651 Stiffness of right hip, not elsewhere classified: Secondary | ICD-10-CM | POA: Diagnosis not present

## 2022-03-03 DIAGNOSIS — M4306 Spondylolysis, lumbar region: Secondary | ICD-10-CM | POA: Diagnosis not present

## 2022-03-03 DIAGNOSIS — M25551 Pain in right hip: Secondary | ICD-10-CM | POA: Diagnosis not present

## 2022-03-05 DIAGNOSIS — I10 Essential (primary) hypertension: Secondary | ICD-10-CM | POA: Diagnosis not present

## 2022-03-06 DIAGNOSIS — M25551 Pain in right hip: Secondary | ICD-10-CM | POA: Diagnosis not present

## 2022-03-06 DIAGNOSIS — M25651 Stiffness of right hip, not elsewhere classified: Secondary | ICD-10-CM | POA: Diagnosis not present

## 2022-03-06 DIAGNOSIS — R2689 Other abnormalities of gait and mobility: Secondary | ICD-10-CM | POA: Diagnosis not present

## 2022-03-06 DIAGNOSIS — M4306 Spondylolysis, lumbar region: Secondary | ICD-10-CM | POA: Diagnosis not present

## 2022-03-07 DIAGNOSIS — Z006 Encounter for examination for normal comparison and control in clinical research program: Secondary | ICD-10-CM | POA: Diagnosis not present

## 2022-03-07 DIAGNOSIS — C4371 Malignant melanoma of right lower limb, including hip: Secondary | ICD-10-CM | POA: Diagnosis not present

## 2022-03-07 DIAGNOSIS — Z79899 Other long term (current) drug therapy: Secondary | ICD-10-CM | POA: Diagnosis not present

## 2022-03-07 DIAGNOSIS — Z5112 Encounter for antineoplastic immunotherapy: Secondary | ICD-10-CM | POA: Diagnosis not present

## 2022-03-11 DIAGNOSIS — R2689 Other abnormalities of gait and mobility: Secondary | ICD-10-CM | POA: Diagnosis not present

## 2022-03-11 DIAGNOSIS — M25551 Pain in right hip: Secondary | ICD-10-CM | POA: Diagnosis not present

## 2022-03-11 DIAGNOSIS — M25651 Stiffness of right hip, not elsewhere classified: Secondary | ICD-10-CM | POA: Diagnosis not present

## 2022-03-11 DIAGNOSIS — M4306 Spondylolysis, lumbar region: Secondary | ICD-10-CM | POA: Diagnosis not present

## 2022-03-14 DIAGNOSIS — R2689 Other abnormalities of gait and mobility: Secondary | ICD-10-CM | POA: Diagnosis not present

## 2022-03-14 DIAGNOSIS — M25651 Stiffness of right hip, not elsewhere classified: Secondary | ICD-10-CM | POA: Diagnosis not present

## 2022-03-14 DIAGNOSIS — M25551 Pain in right hip: Secondary | ICD-10-CM | POA: Diagnosis not present

## 2022-03-14 DIAGNOSIS — M4306 Spondylolysis, lumbar region: Secondary | ICD-10-CM | POA: Diagnosis not present

## 2022-03-17 DIAGNOSIS — M47816 Spondylosis without myelopathy or radiculopathy, lumbar region: Secondary | ICD-10-CM | POA: Diagnosis not present

## 2022-03-18 DIAGNOSIS — M25651 Stiffness of right hip, not elsewhere classified: Secondary | ICD-10-CM | POA: Diagnosis not present

## 2022-03-18 DIAGNOSIS — R2689 Other abnormalities of gait and mobility: Secondary | ICD-10-CM | POA: Diagnosis not present

## 2022-03-18 DIAGNOSIS — M25551 Pain in right hip: Secondary | ICD-10-CM | POA: Diagnosis not present

## 2022-03-18 DIAGNOSIS — M4306 Spondylolysis, lumbar region: Secondary | ICD-10-CM | POA: Diagnosis not present

## 2022-03-20 DIAGNOSIS — M25562 Pain in left knee: Secondary | ICD-10-CM | POA: Diagnosis not present

## 2022-03-20 DIAGNOSIS — M1712 Unilateral primary osteoarthritis, left knee: Secondary | ICD-10-CM | POA: Diagnosis not present

## 2022-03-20 DIAGNOSIS — M7122 Synovial cyst of popliteal space [Baker], left knee: Secondary | ICD-10-CM | POA: Diagnosis not present

## 2022-03-28 DIAGNOSIS — Z7952 Long term (current) use of systemic steroids: Secondary | ICD-10-CM | POA: Diagnosis not present

## 2022-03-28 DIAGNOSIS — M25461 Effusion, right knee: Secondary | ICD-10-CM | POA: Diagnosis not present

## 2022-03-28 DIAGNOSIS — Z5112 Encounter for antineoplastic immunotherapy: Secondary | ICD-10-CM | POA: Diagnosis not present

## 2022-03-28 DIAGNOSIS — M25551 Pain in right hip: Secondary | ICD-10-CM | POA: Diagnosis not present

## 2022-03-28 DIAGNOSIS — Z79899 Other long term (current) drug therapy: Secondary | ICD-10-CM | POA: Diagnosis not present

## 2022-03-28 DIAGNOSIS — M25559 Pain in unspecified hip: Secondary | ICD-10-CM | POA: Diagnosis not present

## 2022-03-28 DIAGNOSIS — Z006 Encounter for examination for normal comparison and control in clinical research program: Secondary | ICD-10-CM | POA: Diagnosis not present

## 2022-03-28 DIAGNOSIS — C4371 Malignant melanoma of right lower limb, including hip: Secondary | ICD-10-CM | POA: Diagnosis not present

## 2022-03-28 DIAGNOSIS — M549 Dorsalgia, unspecified: Secondary | ICD-10-CM | POA: Diagnosis not present

## 2022-04-01 DIAGNOSIS — R2689 Other abnormalities of gait and mobility: Secondary | ICD-10-CM | POA: Diagnosis not present

## 2022-04-01 DIAGNOSIS — M4306 Spondylolysis, lumbar region: Secondary | ICD-10-CM | POA: Diagnosis not present

## 2022-04-01 DIAGNOSIS — M25551 Pain in right hip: Secondary | ICD-10-CM | POA: Diagnosis not present

## 2022-04-01 DIAGNOSIS — M25651 Stiffness of right hip, not elsewhere classified: Secondary | ICD-10-CM | POA: Diagnosis not present

## 2022-04-04 DIAGNOSIS — R2689 Other abnormalities of gait and mobility: Secondary | ICD-10-CM | POA: Diagnosis not present

## 2022-04-04 DIAGNOSIS — M25551 Pain in right hip: Secondary | ICD-10-CM | POA: Diagnosis not present

## 2022-04-04 DIAGNOSIS — M4306 Spondylolysis, lumbar region: Secondary | ICD-10-CM | POA: Diagnosis not present

## 2022-04-04 DIAGNOSIS — M25651 Stiffness of right hip, not elsewhere classified: Secondary | ICD-10-CM | POA: Diagnosis not present

## 2022-04-09 DIAGNOSIS — M25551 Pain in right hip: Secondary | ICD-10-CM | POA: Diagnosis not present

## 2022-04-09 DIAGNOSIS — R2689 Other abnormalities of gait and mobility: Secondary | ICD-10-CM | POA: Diagnosis not present

## 2022-04-09 DIAGNOSIS — M4306 Spondylolysis, lumbar region: Secondary | ICD-10-CM | POA: Diagnosis not present

## 2022-04-09 DIAGNOSIS — M25651 Stiffness of right hip, not elsewhere classified: Secondary | ICD-10-CM | POA: Diagnosis not present

## 2022-04-11 DIAGNOSIS — R2689 Other abnormalities of gait and mobility: Secondary | ICD-10-CM | POA: Diagnosis not present

## 2022-04-11 DIAGNOSIS — M25551 Pain in right hip: Secondary | ICD-10-CM | POA: Diagnosis not present

## 2022-04-11 DIAGNOSIS — M25651 Stiffness of right hip, not elsewhere classified: Secondary | ICD-10-CM | POA: Diagnosis not present

## 2022-04-11 DIAGNOSIS — M4306 Spondylolysis, lumbar region: Secondary | ICD-10-CM | POA: Diagnosis not present

## 2022-04-14 DIAGNOSIS — M47816 Spondylosis without myelopathy or radiculopathy, lumbar region: Secondary | ICD-10-CM | POA: Diagnosis not present

## 2022-04-16 DIAGNOSIS — M25551 Pain in right hip: Secondary | ICD-10-CM | POA: Diagnosis not present

## 2022-04-16 DIAGNOSIS — R2689 Other abnormalities of gait and mobility: Secondary | ICD-10-CM | POA: Diagnosis not present

## 2022-04-16 DIAGNOSIS — M4306 Spondylolysis, lumbar region: Secondary | ICD-10-CM | POA: Diagnosis not present

## 2022-04-16 DIAGNOSIS — M25651 Stiffness of right hip, not elsewhere classified: Secondary | ICD-10-CM | POA: Diagnosis not present

## 2022-04-18 DIAGNOSIS — R2689 Other abnormalities of gait and mobility: Secondary | ICD-10-CM | POA: Diagnosis not present

## 2022-04-18 DIAGNOSIS — M4306 Spondylolysis, lumbar region: Secondary | ICD-10-CM | POA: Diagnosis not present

## 2022-04-18 DIAGNOSIS — M25551 Pain in right hip: Secondary | ICD-10-CM | POA: Diagnosis not present

## 2022-04-18 DIAGNOSIS — M25651 Stiffness of right hip, not elsewhere classified: Secondary | ICD-10-CM | POA: Diagnosis not present

## 2022-04-28 ENCOUNTER — Other Ambulatory Visit: Payer: Self-pay | Admitting: Internal Medicine

## 2022-04-28 DIAGNOSIS — Z1231 Encounter for screening mammogram for malignant neoplasm of breast: Secondary | ICD-10-CM

## 2022-04-30 DIAGNOSIS — M4306 Spondylolysis, lumbar region: Secondary | ICD-10-CM | POA: Diagnosis not present

## 2022-04-30 DIAGNOSIS — M25551 Pain in right hip: Secondary | ICD-10-CM | POA: Diagnosis not present

## 2022-04-30 DIAGNOSIS — R2689 Other abnormalities of gait and mobility: Secondary | ICD-10-CM | POA: Diagnosis not present

## 2022-04-30 DIAGNOSIS — M25651 Stiffness of right hip, not elsewhere classified: Secondary | ICD-10-CM | POA: Diagnosis not present

## 2022-05-05 DIAGNOSIS — M25551 Pain in right hip: Secondary | ICD-10-CM | POA: Diagnosis not present

## 2022-05-05 DIAGNOSIS — R2689 Other abnormalities of gait and mobility: Secondary | ICD-10-CM | POA: Diagnosis not present

## 2022-05-05 DIAGNOSIS — M4306 Spondylolysis, lumbar region: Secondary | ICD-10-CM | POA: Diagnosis not present

## 2022-05-05 DIAGNOSIS — M25651 Stiffness of right hip, not elsewhere classified: Secondary | ICD-10-CM | POA: Diagnosis not present

## 2022-05-07 DIAGNOSIS — R937 Abnormal findings on diagnostic imaging of other parts of musculoskeletal system: Secondary | ICD-10-CM | POA: Diagnosis not present

## 2022-05-07 DIAGNOSIS — C439 Malignant melanoma of skin, unspecified: Secondary | ICD-10-CM | POA: Diagnosis not present

## 2022-05-07 DIAGNOSIS — M25562 Pain in left knee: Secondary | ICD-10-CM | POA: Diagnosis not present

## 2022-05-07 DIAGNOSIS — M7122 Synovial cyst of popliteal space [Baker], left knee: Secondary | ICD-10-CM | POA: Diagnosis not present

## 2022-05-07 DIAGNOSIS — M1712 Unilateral primary osteoarthritis, left knee: Secondary | ICD-10-CM | POA: Diagnosis not present

## 2022-05-07 DIAGNOSIS — R936 Abnormal findings on diagnostic imaging of limbs: Secondary | ICD-10-CM | POA: Diagnosis not present

## 2022-05-07 DIAGNOSIS — C9100 Acute lymphoblastic leukemia not having achieved remission: Secondary | ICD-10-CM | POA: Diagnosis not present

## 2022-05-07 DIAGNOSIS — N281 Cyst of kidney, acquired: Secondary | ICD-10-CM | POA: Diagnosis not present

## 2022-05-07 DIAGNOSIS — C4371 Malignant melanoma of right lower limb, including hip: Secondary | ICD-10-CM | POA: Diagnosis not present

## 2022-05-08 DIAGNOSIS — M4306 Spondylolysis, lumbar region: Secondary | ICD-10-CM | POA: Diagnosis not present

## 2022-05-08 DIAGNOSIS — M25651 Stiffness of right hip, not elsewhere classified: Secondary | ICD-10-CM | POA: Diagnosis not present

## 2022-05-08 DIAGNOSIS — R2689 Other abnormalities of gait and mobility: Secondary | ICD-10-CM | POA: Diagnosis not present

## 2022-05-08 DIAGNOSIS — M25551 Pain in right hip: Secondary | ICD-10-CM | POA: Diagnosis not present

## 2022-05-09 DIAGNOSIS — M549 Dorsalgia, unspecified: Secondary | ICD-10-CM | POA: Diagnosis not present

## 2022-05-09 DIAGNOSIS — K5730 Diverticulosis of large intestine without perforation or abscess without bleeding: Secondary | ICD-10-CM | POA: Diagnosis not present

## 2022-05-09 DIAGNOSIS — N281 Cyst of kidney, acquired: Secondary | ICD-10-CM | POA: Diagnosis not present

## 2022-05-09 DIAGNOSIS — S2232XD Fracture of one rib, left side, subsequent encounter for fracture with routine healing: Secondary | ICD-10-CM | POA: Diagnosis not present

## 2022-05-09 DIAGNOSIS — Z006 Encounter for examination for normal comparison and control in clinical research program: Secondary | ICD-10-CM | POA: Diagnosis not present

## 2022-05-09 DIAGNOSIS — M7120 Synovial cyst of popliteal space [Baker], unspecified knee: Secondary | ICD-10-CM | POA: Diagnosis not present

## 2022-05-09 DIAGNOSIS — C439 Malignant melanoma of skin, unspecified: Secondary | ICD-10-CM | POA: Diagnosis not present

## 2022-05-09 DIAGNOSIS — M25551 Pain in right hip: Secondary | ICD-10-CM | POA: Diagnosis not present

## 2022-05-09 DIAGNOSIS — C4371 Malignant melanoma of right lower limb, including hip: Secondary | ICD-10-CM | POA: Diagnosis not present

## 2022-05-09 DIAGNOSIS — Z5112 Encounter for antineoplastic immunotherapy: Secondary | ICD-10-CM | POA: Diagnosis not present

## 2022-05-09 DIAGNOSIS — R5383 Other fatigue: Secondary | ICD-10-CM | POA: Diagnosis not present

## 2022-05-09 DIAGNOSIS — M79651 Pain in right thigh: Secondary | ICD-10-CM | POA: Diagnosis not present

## 2022-05-12 DIAGNOSIS — M25651 Stiffness of right hip, not elsewhere classified: Secondary | ICD-10-CM | POA: Diagnosis not present

## 2022-05-12 DIAGNOSIS — M4306 Spondylolysis, lumbar region: Secondary | ICD-10-CM | POA: Diagnosis not present

## 2022-05-12 DIAGNOSIS — R2689 Other abnormalities of gait and mobility: Secondary | ICD-10-CM | POA: Diagnosis not present

## 2022-05-12 DIAGNOSIS — M25551 Pain in right hip: Secondary | ICD-10-CM | POA: Diagnosis not present

## 2022-05-13 DIAGNOSIS — M47816 Spondylosis without myelopathy or radiculopathy, lumbar region: Secondary | ICD-10-CM | POA: Diagnosis not present

## 2022-05-15 ENCOUNTER — Other Ambulatory Visit: Payer: Self-pay | Admitting: Sports Medicine

## 2022-05-15 DIAGNOSIS — M4306 Spondylolysis, lumbar region: Secondary | ICD-10-CM | POA: Diagnosis not present

## 2022-05-15 DIAGNOSIS — M25651 Stiffness of right hip, not elsewhere classified: Secondary | ICD-10-CM | POA: Diagnosis not present

## 2022-05-15 DIAGNOSIS — M7120 Synovial cyst of popliteal space [Baker], unspecified knee: Secondary | ICD-10-CM

## 2022-05-15 DIAGNOSIS — M25551 Pain in right hip: Secondary | ICD-10-CM | POA: Diagnosis not present

## 2022-05-15 DIAGNOSIS — R2689 Other abnormalities of gait and mobility: Secondary | ICD-10-CM | POA: Diagnosis not present

## 2022-05-15 DIAGNOSIS — M25562 Pain in left knee: Secondary | ICD-10-CM

## 2022-05-20 DIAGNOSIS — M4306 Spondylolysis, lumbar region: Secondary | ICD-10-CM | POA: Diagnosis not present

## 2022-05-20 DIAGNOSIS — M25651 Stiffness of right hip, not elsewhere classified: Secondary | ICD-10-CM | POA: Diagnosis not present

## 2022-05-20 DIAGNOSIS — M25551 Pain in right hip: Secondary | ICD-10-CM | POA: Diagnosis not present

## 2022-05-20 DIAGNOSIS — R2689 Other abnormalities of gait and mobility: Secondary | ICD-10-CM | POA: Diagnosis not present

## 2022-05-21 DIAGNOSIS — I10 Essential (primary) hypertension: Secondary | ICD-10-CM | POA: Diagnosis not present

## 2022-05-21 DIAGNOSIS — E7800 Pure hypercholesterolemia, unspecified: Secondary | ICD-10-CM | POA: Diagnosis not present

## 2022-05-22 DIAGNOSIS — M47816 Spondylosis without myelopathy or radiculopathy, lumbar region: Secondary | ICD-10-CM | POA: Diagnosis not present

## 2022-05-22 DIAGNOSIS — Z6825 Body mass index (BMI) 25.0-25.9, adult: Secondary | ICD-10-CM | POA: Diagnosis not present

## 2022-05-22 DIAGNOSIS — M5416 Radiculopathy, lumbar region: Secondary | ICD-10-CM | POA: Diagnosis not present

## 2022-05-23 DIAGNOSIS — M4306 Spondylolysis, lumbar region: Secondary | ICD-10-CM | POA: Diagnosis not present

## 2022-05-23 DIAGNOSIS — R2689 Other abnormalities of gait and mobility: Secondary | ICD-10-CM | POA: Diagnosis not present

## 2022-05-23 DIAGNOSIS — M25651 Stiffness of right hip, not elsewhere classified: Secondary | ICD-10-CM | POA: Diagnosis not present

## 2022-05-23 DIAGNOSIS — M25551 Pain in right hip: Secondary | ICD-10-CM | POA: Diagnosis not present

## 2022-05-26 DIAGNOSIS — M25551 Pain in right hip: Secondary | ICD-10-CM | POA: Diagnosis not present

## 2022-05-26 DIAGNOSIS — M4306 Spondylolysis, lumbar region: Secondary | ICD-10-CM | POA: Diagnosis not present

## 2022-05-26 DIAGNOSIS — M25651 Stiffness of right hip, not elsewhere classified: Secondary | ICD-10-CM | POA: Diagnosis not present

## 2022-05-26 DIAGNOSIS — R2689 Other abnormalities of gait and mobility: Secondary | ICD-10-CM | POA: Diagnosis not present

## 2022-05-29 DIAGNOSIS — M25551 Pain in right hip: Secondary | ICD-10-CM | POA: Diagnosis not present

## 2022-05-29 DIAGNOSIS — M4306 Spondylolysis, lumbar region: Secondary | ICD-10-CM | POA: Diagnosis not present

## 2022-05-29 DIAGNOSIS — M25651 Stiffness of right hip, not elsewhere classified: Secondary | ICD-10-CM | POA: Diagnosis not present

## 2022-05-29 DIAGNOSIS — R2689 Other abnormalities of gait and mobility: Secondary | ICD-10-CM | POA: Diagnosis not present

## 2022-06-02 ENCOUNTER — Ambulatory Visit
Admission: RE | Admit: 2022-06-02 | Discharge: 2022-06-02 | Disposition: A | Discharge status: Home / Self Care | Payer: PPO | Source: Ambulatory Visit | Attending: Sports Medicine | Admitting: Sports Medicine

## 2022-06-02 DIAGNOSIS — M7120 Synovial cyst of popliteal space [Baker], unspecified knee: Secondary | ICD-10-CM

## 2022-06-02 DIAGNOSIS — M25562 Pain in left knee: Secondary | ICD-10-CM

## 2022-06-03 DIAGNOSIS — M4306 Spondylolysis, lumbar region: Secondary | ICD-10-CM | POA: Diagnosis not present

## 2022-06-03 DIAGNOSIS — M25651 Stiffness of right hip, not elsewhere classified: Secondary | ICD-10-CM | POA: Diagnosis not present

## 2022-06-03 DIAGNOSIS — Z1231 Encounter for screening mammogram for malignant neoplasm of breast: Secondary | ICD-10-CM

## 2022-06-03 DIAGNOSIS — M25551 Pain in right hip: Secondary | ICD-10-CM | POA: Diagnosis not present

## 2022-06-03 DIAGNOSIS — R2689 Other abnormalities of gait and mobility: Secondary | ICD-10-CM | POA: Diagnosis not present

## 2022-06-04 DIAGNOSIS — M160 Bilateral primary osteoarthritis of hip: Secondary | ICD-10-CM | POA: Diagnosis not present

## 2022-06-04 DIAGNOSIS — M1712 Unilateral primary osteoarthritis, left knee: Secondary | ICD-10-CM | POA: Diagnosis not present

## 2022-06-04 DIAGNOSIS — M25551 Pain in right hip: Secondary | ICD-10-CM | POA: Diagnosis not present

## 2022-06-04 DIAGNOSIS — Z01818 Encounter for other preprocedural examination: Secondary | ICD-10-CM | POA: Diagnosis not present

## 2022-06-04 DIAGNOSIS — M47816 Spondylosis without myelopathy or radiculopathy, lumbar region: Secondary | ICD-10-CM | POA: Diagnosis not present

## 2022-06-04 DIAGNOSIS — M461 Sacroiliitis, not elsewhere classified: Secondary | ICD-10-CM | POA: Diagnosis not present

## 2022-06-04 DIAGNOSIS — M25562 Pain in left knee: Secondary | ICD-10-CM | POA: Diagnosis not present

## 2022-06-04 DIAGNOSIS — M1611 Unilateral primary osteoarthritis, right hip: Secondary | ICD-10-CM | POA: Diagnosis not present

## 2022-06-05 ENCOUNTER — Ambulatory Visit
Admission: RE | Admit: 2022-06-05 | Discharge: 2022-06-05 | Disposition: A | Discharge status: Home / Self Care | Payer: PPO | Source: Ambulatory Visit

## 2022-06-05 DIAGNOSIS — Z1231 Encounter for screening mammogram for malignant neoplasm of breast: Secondary | ICD-10-CM | POA: Diagnosis not present

## 2022-06-06 DIAGNOSIS — M25551 Pain in right hip: Secondary | ICD-10-CM | POA: Diagnosis not present

## 2022-06-06 DIAGNOSIS — M4306 Spondylolysis, lumbar region: Secondary | ICD-10-CM | POA: Diagnosis not present

## 2022-06-06 DIAGNOSIS — M25651 Stiffness of right hip, not elsewhere classified: Secondary | ICD-10-CM | POA: Diagnosis not present

## 2022-06-06 DIAGNOSIS — R2689 Other abnormalities of gait and mobility: Secondary | ICD-10-CM | POA: Diagnosis not present

## 2022-06-09 DIAGNOSIS — R2689 Other abnormalities of gait and mobility: Secondary | ICD-10-CM | POA: Diagnosis not present

## 2022-06-09 DIAGNOSIS — M4306 Spondylolysis, lumbar region: Secondary | ICD-10-CM | POA: Diagnosis not present

## 2022-06-09 DIAGNOSIS — M25651 Stiffness of right hip, not elsewhere classified: Secondary | ICD-10-CM | POA: Diagnosis not present

## 2022-06-09 DIAGNOSIS — M25551 Pain in right hip: Secondary | ICD-10-CM | POA: Diagnosis not present

## 2022-06-10 DIAGNOSIS — L812 Freckles: Secondary | ICD-10-CM | POA: Diagnosis not present

## 2022-06-10 DIAGNOSIS — Z85828 Personal history of other malignant neoplasm of skin: Secondary | ICD-10-CM | POA: Diagnosis not present

## 2022-06-10 DIAGNOSIS — D225 Melanocytic nevi of trunk: Secondary | ICD-10-CM | POA: Diagnosis not present

## 2022-06-10 DIAGNOSIS — L820 Inflamed seborrheic keratosis: Secondary | ICD-10-CM | POA: Diagnosis not present

## 2022-06-10 DIAGNOSIS — D1801 Hemangioma of skin and subcutaneous tissue: Secondary | ICD-10-CM | POA: Diagnosis not present

## 2022-06-10 DIAGNOSIS — L821 Other seborrheic keratosis: Secondary | ICD-10-CM | POA: Diagnosis not present

## 2022-06-10 DIAGNOSIS — Z85820 Personal history of malignant melanoma of skin: Secondary | ICD-10-CM | POA: Diagnosis not present

## 2022-06-12 DIAGNOSIS — M25551 Pain in right hip: Secondary | ICD-10-CM | POA: Diagnosis not present

## 2022-06-12 DIAGNOSIS — R2689 Other abnormalities of gait and mobility: Secondary | ICD-10-CM | POA: Diagnosis not present

## 2022-06-12 DIAGNOSIS — M25651 Stiffness of right hip, not elsewhere classified: Secondary | ICD-10-CM | POA: Diagnosis not present

## 2022-06-12 DIAGNOSIS — M4306 Spondylolysis, lumbar region: Secondary | ICD-10-CM | POA: Diagnosis not present

## 2022-06-16 DIAGNOSIS — M4306 Spondylolysis, lumbar region: Secondary | ICD-10-CM | POA: Diagnosis not present

## 2022-06-16 DIAGNOSIS — R2689 Other abnormalities of gait and mobility: Secondary | ICD-10-CM | POA: Diagnosis not present

## 2022-06-16 DIAGNOSIS — M25551 Pain in right hip: Secondary | ICD-10-CM | POA: Diagnosis not present

## 2022-06-16 DIAGNOSIS — M25651 Stiffness of right hip, not elsewhere classified: Secondary | ICD-10-CM | POA: Diagnosis not present

## 2022-06-18 DIAGNOSIS — C4371 Malignant melanoma of right lower limb, including hip: Secondary | ICD-10-CM | POA: Diagnosis not present

## 2022-06-18 DIAGNOSIS — Z006 Encounter for examination for normal comparison and control in clinical research program: Secondary | ICD-10-CM | POA: Diagnosis not present

## 2022-06-18 DIAGNOSIS — C499 Malignant neoplasm of connective and soft tissue, unspecified: Secondary | ICD-10-CM | POA: Diagnosis not present

## 2022-06-19 DIAGNOSIS — R2689 Other abnormalities of gait and mobility: Secondary | ICD-10-CM | POA: Diagnosis not present

## 2022-06-19 DIAGNOSIS — M25651 Stiffness of right hip, not elsewhere classified: Secondary | ICD-10-CM | POA: Diagnosis not present

## 2022-06-19 DIAGNOSIS — M4306 Spondylolysis, lumbar region: Secondary | ICD-10-CM | POA: Diagnosis not present

## 2022-06-19 DIAGNOSIS — M25551 Pain in right hip: Secondary | ICD-10-CM | POA: Diagnosis not present

## 2022-06-20 DIAGNOSIS — C4371 Malignant melanoma of right lower limb, including hip: Secondary | ICD-10-CM | POA: Diagnosis not present

## 2022-06-20 DIAGNOSIS — Z5112 Encounter for antineoplastic immunotherapy: Secondary | ICD-10-CM | POA: Diagnosis not present

## 2022-06-20 DIAGNOSIS — Z006 Encounter for examination for normal comparison and control in clinical research program: Secondary | ICD-10-CM | POA: Diagnosis not present

## 2022-06-23 DIAGNOSIS — M25551 Pain in right hip: Secondary | ICD-10-CM | POA: Diagnosis not present

## 2022-06-23 DIAGNOSIS — M4306 Spondylolysis, lumbar region: Secondary | ICD-10-CM | POA: Diagnosis not present

## 2022-06-23 DIAGNOSIS — R2689 Other abnormalities of gait and mobility: Secondary | ICD-10-CM | POA: Diagnosis not present

## 2022-06-23 DIAGNOSIS — M25651 Stiffness of right hip, not elsewhere classified: Secondary | ICD-10-CM | POA: Diagnosis not present

## 2022-06-25 DIAGNOSIS — M47816 Spondylosis without myelopathy or radiculopathy, lumbar region: Secondary | ICD-10-CM | POA: Diagnosis not present

## 2022-06-25 DIAGNOSIS — M461 Sacroiliitis, not elsewhere classified: Secondary | ICD-10-CM | POA: Diagnosis not present

## 2022-06-25 DIAGNOSIS — Z6825 Body mass index (BMI) 25.0-25.9, adult: Secondary | ICD-10-CM | POA: Diagnosis not present

## 2022-06-26 DIAGNOSIS — M25551 Pain in right hip: Secondary | ICD-10-CM | POA: Diagnosis not present

## 2022-06-26 DIAGNOSIS — R2689 Other abnormalities of gait and mobility: Secondary | ICD-10-CM | POA: Diagnosis not present

## 2022-06-26 DIAGNOSIS — M4306 Spondylolysis, lumbar region: Secondary | ICD-10-CM | POA: Diagnosis not present

## 2022-06-26 DIAGNOSIS — M25651 Stiffness of right hip, not elsewhere classified: Secondary | ICD-10-CM | POA: Diagnosis not present

## 2022-06-30 DIAGNOSIS — M1712 Unilateral primary osteoarthritis, left knee: Secondary | ICD-10-CM | POA: Diagnosis not present

## 2022-07-04 DIAGNOSIS — R2689 Other abnormalities of gait and mobility: Secondary | ICD-10-CM | POA: Diagnosis not present

## 2022-07-04 DIAGNOSIS — M25651 Stiffness of right hip, not elsewhere classified: Secondary | ICD-10-CM | POA: Diagnosis not present

## 2022-07-04 DIAGNOSIS — M25551 Pain in right hip: Secondary | ICD-10-CM | POA: Diagnosis not present

## 2022-07-04 DIAGNOSIS — M4306 Spondylolysis, lumbar region: Secondary | ICD-10-CM | POA: Diagnosis not present

## 2022-07-08 DIAGNOSIS — E7800 Pure hypercholesterolemia, unspecified: Secondary | ICD-10-CM | POA: Diagnosis not present

## 2022-07-08 DIAGNOSIS — M8589 Other specified disorders of bone density and structure, multiple sites: Secondary | ICD-10-CM | POA: Diagnosis not present

## 2022-07-08 DIAGNOSIS — I10 Essential (primary) hypertension: Secondary | ICD-10-CM | POA: Diagnosis not present

## 2022-07-08 DIAGNOSIS — M8580 Other specified disorders of bone density and structure, unspecified site: Secondary | ICD-10-CM | POA: Diagnosis not present

## 2022-07-08 DIAGNOSIS — Z0000 Encounter for general adult medical examination without abnormal findings: Secondary | ICD-10-CM | POA: Diagnosis not present

## 2022-07-09 DIAGNOSIS — M6289 Other specified disorders of muscle: Secondary | ICD-10-CM | POA: Diagnosis not present

## 2022-07-09 DIAGNOSIS — Z5189 Encounter for other specified aftercare: Secondary | ICD-10-CM | POA: Diagnosis not present

## 2022-07-09 DIAGNOSIS — M25451 Effusion, right hip: Secondary | ICD-10-CM | POA: Diagnosis not present

## 2022-07-09 DIAGNOSIS — Z9889 Other specified postprocedural states: Secondary | ICD-10-CM | POA: Diagnosis not present

## 2022-07-09 DIAGNOSIS — C439 Malignant melanoma of skin, unspecified: Secondary | ICD-10-CM | POA: Diagnosis not present

## 2022-07-11 DIAGNOSIS — M25551 Pain in right hip: Secondary | ICD-10-CM | POA: Diagnosis not present

## 2022-07-11 DIAGNOSIS — M4306 Spondylolysis, lumbar region: Secondary | ICD-10-CM | POA: Diagnosis not present

## 2022-07-11 DIAGNOSIS — M25651 Stiffness of right hip, not elsewhere classified: Secondary | ICD-10-CM | POA: Diagnosis not present

## 2022-07-11 DIAGNOSIS — R2689 Other abnormalities of gait and mobility: Secondary | ICD-10-CM | POA: Diagnosis not present

## 2022-07-14 DIAGNOSIS — Z0000 Encounter for general adult medical examination without abnormal findings: Secondary | ICD-10-CM | POA: Diagnosis not present

## 2022-07-14 DIAGNOSIS — M545 Low back pain, unspecified: Secondary | ICD-10-CM | POA: Diagnosis not present

## 2022-07-14 DIAGNOSIS — C4371 Malignant melanoma of right lower limb, including hip: Secondary | ICD-10-CM | POA: Diagnosis not present

## 2022-07-14 DIAGNOSIS — R82998 Other abnormal findings in urine: Secondary | ICD-10-CM | POA: Diagnosis not present

## 2022-07-14 DIAGNOSIS — Z1339 Encounter for screening examination for other mental health and behavioral disorders: Secondary | ICD-10-CM | POA: Diagnosis not present

## 2022-07-14 DIAGNOSIS — M8580 Other specified disorders of bone density and structure, unspecified site: Secondary | ICD-10-CM | POA: Diagnosis not present

## 2022-07-14 DIAGNOSIS — I10 Essential (primary) hypertension: Secondary | ICD-10-CM | POA: Diagnosis not present

## 2022-07-14 DIAGNOSIS — R7989 Other specified abnormal findings of blood chemistry: Secondary | ICD-10-CM | POA: Diagnosis not present

## 2022-07-14 DIAGNOSIS — E7800 Pure hypercholesterolemia, unspecified: Secondary | ICD-10-CM | POA: Diagnosis not present

## 2022-07-14 DIAGNOSIS — Z1331 Encounter for screening for depression: Secondary | ICD-10-CM | POA: Diagnosis not present

## 2022-07-15 DIAGNOSIS — M25651 Stiffness of right hip, not elsewhere classified: Secondary | ICD-10-CM | POA: Diagnosis not present

## 2022-07-15 DIAGNOSIS — M4306 Spondylolysis, lumbar region: Secondary | ICD-10-CM | POA: Diagnosis not present

## 2022-07-15 DIAGNOSIS — M25551 Pain in right hip: Secondary | ICD-10-CM | POA: Diagnosis not present

## 2022-07-15 DIAGNOSIS — R2689 Other abnormalities of gait and mobility: Secondary | ICD-10-CM | POA: Diagnosis not present

## 2022-07-22 DIAGNOSIS — M25551 Pain in right hip: Secondary | ICD-10-CM | POA: Diagnosis not present

## 2022-07-22 DIAGNOSIS — M4306 Spondylolysis, lumbar region: Secondary | ICD-10-CM | POA: Diagnosis not present

## 2022-07-22 DIAGNOSIS — R2689 Other abnormalities of gait and mobility: Secondary | ICD-10-CM | POA: Diagnosis not present

## 2022-07-22 DIAGNOSIS — M25651 Stiffness of right hip, not elsewhere classified: Secondary | ICD-10-CM | POA: Diagnosis not present

## 2022-07-29 DIAGNOSIS — M25651 Stiffness of right hip, not elsewhere classified: Secondary | ICD-10-CM | POA: Diagnosis not present

## 2022-07-29 DIAGNOSIS — M4306 Spondylolysis, lumbar region: Secondary | ICD-10-CM | POA: Diagnosis not present

## 2022-07-29 DIAGNOSIS — R2689 Other abnormalities of gait and mobility: Secondary | ICD-10-CM | POA: Diagnosis not present

## 2022-07-29 DIAGNOSIS — M25551 Pain in right hip: Secondary | ICD-10-CM | POA: Diagnosis not present

## 2022-07-30 DIAGNOSIS — C4371 Malignant melanoma of right lower limb, including hip: Secondary | ICD-10-CM | POA: Diagnosis not present

## 2022-07-30 DIAGNOSIS — Z9889 Other specified postprocedural states: Secondary | ICD-10-CM | POA: Diagnosis not present

## 2022-08-01 DIAGNOSIS — C439 Malignant melanoma of skin, unspecified: Secondary | ICD-10-CM | POA: Diagnosis not present

## 2022-08-01 DIAGNOSIS — C4371 Malignant melanoma of right lower limb, including hip: Secondary | ICD-10-CM | POA: Diagnosis not present

## 2022-08-18 DIAGNOSIS — M1611 Unilateral primary osteoarthritis, right hip: Secondary | ICD-10-CM | POA: Diagnosis not present

## 2022-08-18 DIAGNOSIS — Z79899 Other long term (current) drug therapy: Secondary | ICD-10-CM | POA: Diagnosis not present

## 2022-08-20 DIAGNOSIS — M25651 Stiffness of right hip, not elsewhere classified: Secondary | ICD-10-CM | POA: Diagnosis not present

## 2022-08-20 DIAGNOSIS — M4306 Spondylolysis, lumbar region: Secondary | ICD-10-CM | POA: Diagnosis not present

## 2022-08-20 DIAGNOSIS — M25551 Pain in right hip: Secondary | ICD-10-CM | POA: Diagnosis not present

## 2022-08-20 DIAGNOSIS — R2689 Other abnormalities of gait and mobility: Secondary | ICD-10-CM | POA: Diagnosis not present

## 2022-09-09 DIAGNOSIS — G8918 Other acute postprocedural pain: Secondary | ICD-10-CM | POA: Diagnosis not present

## 2022-09-09 DIAGNOSIS — R519 Headache, unspecified: Secondary | ICD-10-CM | POA: Diagnosis not present

## 2022-09-09 DIAGNOSIS — Z96641 Presence of right artificial hip joint: Secondary | ICD-10-CM | POA: Diagnosis not present

## 2022-09-09 DIAGNOSIS — Z7982 Long term (current) use of aspirin: Secondary | ICD-10-CM | POA: Diagnosis not present

## 2022-09-09 DIAGNOSIS — M545 Low back pain, unspecified: Secondary | ICD-10-CM | POA: Diagnosis not present

## 2022-09-09 DIAGNOSIS — Z7952 Long term (current) use of systemic steroids: Secondary | ICD-10-CM | POA: Diagnosis not present

## 2022-09-09 DIAGNOSIS — E785 Hyperlipidemia, unspecified: Secondary | ICD-10-CM | POA: Diagnosis not present

## 2022-09-09 DIAGNOSIS — I10 Essential (primary) hypertension: Secondary | ICD-10-CM | POA: Diagnosis not present

## 2022-09-09 DIAGNOSIS — Z79899 Other long term (current) drug therapy: Secondary | ICD-10-CM | POA: Diagnosis not present

## 2022-09-09 DIAGNOSIS — I2510 Atherosclerotic heart disease of native coronary artery without angina pectoris: Secondary | ICD-10-CM | POA: Diagnosis not present

## 2022-09-09 DIAGNOSIS — M1611 Unilateral primary osteoarthritis, right hip: Secondary | ICD-10-CM | POA: Diagnosis not present

## 2022-09-09 DIAGNOSIS — Z85820 Personal history of malignant melanoma of skin: Secondary | ICD-10-CM | POA: Diagnosis not present

## 2022-09-15 DIAGNOSIS — R262 Difficulty in walking, not elsewhere classified: Secondary | ICD-10-CM | POA: Diagnosis not present

## 2022-09-15 DIAGNOSIS — Z471 Aftercare following joint replacement surgery: Secondary | ICD-10-CM | POA: Diagnosis not present

## 2022-09-15 DIAGNOSIS — M25651 Stiffness of right hip, not elsewhere classified: Secondary | ICD-10-CM | POA: Diagnosis not present

## 2022-09-16 DIAGNOSIS — M25551 Pain in right hip: Secondary | ICD-10-CM | POA: Diagnosis not present

## 2022-09-16 DIAGNOSIS — M4306 Spondylolysis, lumbar region: Secondary | ICD-10-CM | POA: Diagnosis not present

## 2022-09-16 DIAGNOSIS — R2689 Other abnormalities of gait and mobility: Secondary | ICD-10-CM | POA: Diagnosis not present

## 2022-09-16 DIAGNOSIS — M25651 Stiffness of right hip, not elsewhere classified: Secondary | ICD-10-CM | POA: Diagnosis not present

## 2022-09-17 DIAGNOSIS — M4306 Spondylolysis, lumbar region: Secondary | ICD-10-CM | POA: Diagnosis not present

## 2022-09-17 DIAGNOSIS — M25651 Stiffness of right hip, not elsewhere classified: Secondary | ICD-10-CM | POA: Diagnosis not present

## 2022-09-17 DIAGNOSIS — M25551 Pain in right hip: Secondary | ICD-10-CM | POA: Diagnosis not present

## 2022-09-17 DIAGNOSIS — R2689 Other abnormalities of gait and mobility: Secondary | ICD-10-CM | POA: Diagnosis not present

## 2022-09-22 DIAGNOSIS — R2689 Other abnormalities of gait and mobility: Secondary | ICD-10-CM | POA: Diagnosis not present

## 2022-09-22 DIAGNOSIS — M25651 Stiffness of right hip, not elsewhere classified: Secondary | ICD-10-CM | POA: Diagnosis not present

## 2022-09-22 DIAGNOSIS — M25551 Pain in right hip: Secondary | ICD-10-CM | POA: Diagnosis not present

## 2022-09-22 DIAGNOSIS — M4306 Spondylolysis, lumbar region: Secondary | ICD-10-CM | POA: Diagnosis not present

## 2022-09-23 DIAGNOSIS — Z96641 Presence of right artificial hip joint: Secondary | ICD-10-CM | POA: Diagnosis not present

## 2022-09-23 DIAGNOSIS — Z9181 History of falling: Secondary | ICD-10-CM | POA: Diagnosis not present

## 2022-09-23 DIAGNOSIS — Z471 Aftercare following joint replacement surgery: Secondary | ICD-10-CM | POA: Diagnosis not present

## 2022-09-23 DIAGNOSIS — Z888 Allergy status to other drugs, medicaments and biological substances status: Secondary | ICD-10-CM | POA: Diagnosis not present

## 2022-09-23 DIAGNOSIS — Z885 Allergy status to narcotic agent status: Secondary | ICD-10-CM | POA: Diagnosis not present

## 2022-09-23 DIAGNOSIS — R030 Elevated blood-pressure reading, without diagnosis of hypertension: Secondary | ICD-10-CM | POA: Diagnosis not present

## 2022-09-24 DIAGNOSIS — M25651 Stiffness of right hip, not elsewhere classified: Secondary | ICD-10-CM | POA: Diagnosis not present

## 2022-09-24 DIAGNOSIS — M25551 Pain in right hip: Secondary | ICD-10-CM | POA: Diagnosis not present

## 2022-09-24 DIAGNOSIS — M4306 Spondylolysis, lumbar region: Secondary | ICD-10-CM | POA: Diagnosis not present

## 2022-09-24 DIAGNOSIS — R2689 Other abnormalities of gait and mobility: Secondary | ICD-10-CM | POA: Diagnosis not present

## 2022-09-26 DIAGNOSIS — R2689 Other abnormalities of gait and mobility: Secondary | ICD-10-CM | POA: Diagnosis not present

## 2022-09-26 DIAGNOSIS — M25551 Pain in right hip: Secondary | ICD-10-CM | POA: Diagnosis not present

## 2022-09-26 DIAGNOSIS — M25651 Stiffness of right hip, not elsewhere classified: Secondary | ICD-10-CM | POA: Diagnosis not present

## 2022-09-26 DIAGNOSIS — M4306 Spondylolysis, lumbar region: Secondary | ICD-10-CM | POA: Diagnosis not present

## 2022-09-29 DIAGNOSIS — Z471 Aftercare following joint replacement surgery: Secondary | ICD-10-CM | POA: Diagnosis not present

## 2022-09-29 DIAGNOSIS — M25651 Stiffness of right hip, not elsewhere classified: Secondary | ICD-10-CM | POA: Diagnosis not present

## 2022-09-29 DIAGNOSIS — R262 Difficulty in walking, not elsewhere classified: Secondary | ICD-10-CM | POA: Diagnosis not present

## 2022-10-01 DIAGNOSIS — Z471 Aftercare following joint replacement surgery: Secondary | ICD-10-CM | POA: Diagnosis not present

## 2022-10-01 DIAGNOSIS — M25651 Stiffness of right hip, not elsewhere classified: Secondary | ICD-10-CM | POA: Diagnosis not present

## 2022-10-01 DIAGNOSIS — R262 Difficulty in walking, not elsewhere classified: Secondary | ICD-10-CM | POA: Diagnosis not present

## 2022-10-03 DIAGNOSIS — M25651 Stiffness of right hip, not elsewhere classified: Secondary | ICD-10-CM | POA: Diagnosis not present

## 2022-10-03 DIAGNOSIS — Z471 Aftercare following joint replacement surgery: Secondary | ICD-10-CM | POA: Diagnosis not present

## 2022-10-03 DIAGNOSIS — R262 Difficulty in walking, not elsewhere classified: Secondary | ICD-10-CM | POA: Diagnosis not present

## 2022-10-06 DIAGNOSIS — Z85828 Personal history of other malignant neoplasm of skin: Secondary | ICD-10-CM | POA: Diagnosis not present

## 2022-10-06 DIAGNOSIS — D2271 Melanocytic nevi of right lower limb, including hip: Secondary | ICD-10-CM | POA: Diagnosis not present

## 2022-10-06 DIAGNOSIS — D225 Melanocytic nevi of trunk: Secondary | ICD-10-CM | POA: Diagnosis not present

## 2022-10-06 DIAGNOSIS — D2272 Melanocytic nevi of left lower limb, including hip: Secondary | ICD-10-CM | POA: Diagnosis not present

## 2022-10-06 DIAGNOSIS — L821 Other seborrheic keratosis: Secondary | ICD-10-CM | POA: Diagnosis not present

## 2022-10-06 DIAGNOSIS — L812 Freckles: Secondary | ICD-10-CM | POA: Diagnosis not present

## 2022-10-06 DIAGNOSIS — D1801 Hemangioma of skin and subcutaneous tissue: Secondary | ICD-10-CM | POA: Diagnosis not present

## 2022-10-06 DIAGNOSIS — Z85820 Personal history of malignant melanoma of skin: Secondary | ICD-10-CM | POA: Diagnosis not present

## 2022-10-06 DIAGNOSIS — L570 Actinic keratosis: Secondary | ICD-10-CM | POA: Diagnosis not present

## 2022-10-07 DIAGNOSIS — R262 Difficulty in walking, not elsewhere classified: Secondary | ICD-10-CM | POA: Diagnosis not present

## 2022-10-07 DIAGNOSIS — M25651 Stiffness of right hip, not elsewhere classified: Secondary | ICD-10-CM | POA: Diagnosis not present

## 2022-10-07 DIAGNOSIS — Z471 Aftercare following joint replacement surgery: Secondary | ICD-10-CM | POA: Diagnosis not present

## 2022-10-09 DIAGNOSIS — Z471 Aftercare following joint replacement surgery: Secondary | ICD-10-CM | POA: Diagnosis not present

## 2022-10-09 DIAGNOSIS — R262 Difficulty in walking, not elsewhere classified: Secondary | ICD-10-CM | POA: Diagnosis not present

## 2022-10-09 DIAGNOSIS — M25651 Stiffness of right hip, not elsewhere classified: Secondary | ICD-10-CM | POA: Diagnosis not present

## 2022-10-13 DIAGNOSIS — Z471 Aftercare following joint replacement surgery: Secondary | ICD-10-CM | POA: Diagnosis not present

## 2022-10-13 DIAGNOSIS — R262 Difficulty in walking, not elsewhere classified: Secondary | ICD-10-CM | POA: Diagnosis not present

## 2022-10-13 DIAGNOSIS — M25651 Stiffness of right hip, not elsewhere classified: Secondary | ICD-10-CM | POA: Diagnosis not present

## 2022-10-15 DIAGNOSIS — Z471 Aftercare following joint replacement surgery: Secondary | ICD-10-CM | POA: Diagnosis not present

## 2022-10-15 DIAGNOSIS — M25651 Stiffness of right hip, not elsewhere classified: Secondary | ICD-10-CM | POA: Diagnosis not present

## 2022-10-15 DIAGNOSIS — Z6825 Body mass index (BMI) 25.0-25.9, adult: Secondary | ICD-10-CM | POA: Diagnosis not present

## 2022-10-15 DIAGNOSIS — R262 Difficulty in walking, not elsewhere classified: Secondary | ICD-10-CM | POA: Diagnosis not present

## 2022-10-15 DIAGNOSIS — M47816 Spondylosis without myelopathy or radiculopathy, lumbar region: Secondary | ICD-10-CM | POA: Diagnosis not present

## 2022-10-20 DIAGNOSIS — M7122 Synovial cyst of popliteal space [Baker], left knee: Secondary | ICD-10-CM | POA: Diagnosis not present

## 2022-10-20 DIAGNOSIS — R269 Unspecified abnormalities of gait and mobility: Secondary | ICD-10-CM | POA: Diagnosis not present

## 2022-10-20 DIAGNOSIS — M25562 Pain in left knee: Secondary | ICD-10-CM | POA: Diagnosis not present

## 2022-10-20 DIAGNOSIS — Z471 Aftercare following joint replacement surgery: Secondary | ICD-10-CM | POA: Diagnosis not present

## 2022-10-20 DIAGNOSIS — R262 Difficulty in walking, not elsewhere classified: Secondary | ICD-10-CM | POA: Diagnosis not present

## 2022-10-20 DIAGNOSIS — M1712 Unilateral primary osteoarthritis, left knee: Secondary | ICD-10-CM | POA: Diagnosis not present

## 2022-10-20 DIAGNOSIS — M25651 Stiffness of right hip, not elsewhere classified: Secondary | ICD-10-CM | POA: Diagnosis not present

## 2022-10-23 DIAGNOSIS — M25651 Stiffness of right hip, not elsewhere classified: Secondary | ICD-10-CM | POA: Diagnosis not present

## 2022-10-23 DIAGNOSIS — R262 Difficulty in walking, not elsewhere classified: Secondary | ICD-10-CM | POA: Diagnosis not present

## 2022-10-23 DIAGNOSIS — Z471 Aftercare following joint replacement surgery: Secondary | ICD-10-CM | POA: Diagnosis not present

## 2022-10-28 DIAGNOSIS — R262 Difficulty in walking, not elsewhere classified: Secondary | ICD-10-CM | POA: Diagnosis not present

## 2022-10-28 DIAGNOSIS — M25651 Stiffness of right hip, not elsewhere classified: Secondary | ICD-10-CM | POA: Diagnosis not present

## 2022-10-28 DIAGNOSIS — Z471 Aftercare following joint replacement surgery: Secondary | ICD-10-CM | POA: Diagnosis not present

## 2022-10-29 DIAGNOSIS — C499 Malignant neoplasm of connective and soft tissue, unspecified: Secondary | ICD-10-CM | POA: Diagnosis not present

## 2022-10-29 DIAGNOSIS — C4371 Malignant melanoma of right lower limb, including hip: Secondary | ICD-10-CM | POA: Diagnosis not present

## 2022-10-29 DIAGNOSIS — Z006 Encounter for examination for normal comparison and control in clinical research program: Secondary | ICD-10-CM | POA: Diagnosis not present

## 2022-10-30 DIAGNOSIS — M1612 Unilateral primary osteoarthritis, left hip: Secondary | ICD-10-CM | POA: Diagnosis not present

## 2022-10-30 DIAGNOSIS — M47817 Spondylosis without myelopathy or radiculopathy, lumbosacral region: Secondary | ICD-10-CM | POA: Diagnosis not present

## 2022-10-30 DIAGNOSIS — M461 Sacroiliitis, not elsewhere classified: Secondary | ICD-10-CM | POA: Diagnosis not present

## 2022-10-30 DIAGNOSIS — Z471 Aftercare following joint replacement surgery: Secondary | ICD-10-CM | POA: Diagnosis not present

## 2022-10-30 DIAGNOSIS — Z96641 Presence of right artificial hip joint: Secondary | ICD-10-CM | POA: Diagnosis not present

## 2022-11-03 DIAGNOSIS — M25651 Stiffness of right hip, not elsewhere classified: Secondary | ICD-10-CM | POA: Diagnosis not present

## 2022-11-03 DIAGNOSIS — Z471 Aftercare following joint replacement surgery: Secondary | ICD-10-CM | POA: Diagnosis not present

## 2022-11-03 DIAGNOSIS — R262 Difficulty in walking, not elsewhere classified: Secondary | ICD-10-CM | POA: Diagnosis not present

## 2022-11-05 DIAGNOSIS — Z471 Aftercare following joint replacement surgery: Secondary | ICD-10-CM | POA: Diagnosis not present

## 2022-11-05 DIAGNOSIS — M25651 Stiffness of right hip, not elsewhere classified: Secondary | ICD-10-CM | POA: Diagnosis not present

## 2022-11-05 DIAGNOSIS — R262 Difficulty in walking, not elsewhere classified: Secondary | ICD-10-CM | POA: Diagnosis not present

## 2022-11-07 DIAGNOSIS — C4371 Malignant melanoma of right lower limb, including hip: Secondary | ICD-10-CM | POA: Diagnosis not present

## 2022-11-10 DIAGNOSIS — Z471 Aftercare following joint replacement surgery: Secondary | ICD-10-CM | POA: Diagnosis not present

## 2022-11-10 DIAGNOSIS — M25651 Stiffness of right hip, not elsewhere classified: Secondary | ICD-10-CM | POA: Diagnosis not present

## 2022-11-10 DIAGNOSIS — R262 Difficulty in walking, not elsewhere classified: Secondary | ICD-10-CM | POA: Diagnosis not present

## 2022-11-21 DIAGNOSIS — M25651 Stiffness of right hip, not elsewhere classified: Secondary | ICD-10-CM | POA: Diagnosis not present

## 2022-11-21 DIAGNOSIS — R262 Difficulty in walking, not elsewhere classified: Secondary | ICD-10-CM | POA: Diagnosis not present

## 2022-11-21 DIAGNOSIS — Z471 Aftercare following joint replacement surgery: Secondary | ICD-10-CM | POA: Diagnosis not present

## 2022-11-28 DIAGNOSIS — M25651 Stiffness of right hip, not elsewhere classified: Secondary | ICD-10-CM | POA: Diagnosis not present

## 2022-11-28 DIAGNOSIS — Z471 Aftercare following joint replacement surgery: Secondary | ICD-10-CM | POA: Diagnosis not present

## 2022-11-28 DIAGNOSIS — R262 Difficulty in walking, not elsewhere classified: Secondary | ICD-10-CM | POA: Diagnosis not present

## 2022-12-05 DIAGNOSIS — R262 Difficulty in walking, not elsewhere classified: Secondary | ICD-10-CM | POA: Diagnosis not present

## 2022-12-05 DIAGNOSIS — M25651 Stiffness of right hip, not elsewhere classified: Secondary | ICD-10-CM | POA: Diagnosis not present

## 2022-12-05 DIAGNOSIS — Z471 Aftercare following joint replacement surgery: Secondary | ICD-10-CM | POA: Diagnosis not present

## 2022-12-08 DIAGNOSIS — R262 Difficulty in walking, not elsewhere classified: Secondary | ICD-10-CM | POA: Diagnosis not present

## 2022-12-08 DIAGNOSIS — M25651 Stiffness of right hip, not elsewhere classified: Secondary | ICD-10-CM | POA: Diagnosis not present

## 2022-12-08 DIAGNOSIS — Z471 Aftercare following joint replacement surgery: Secondary | ICD-10-CM | POA: Diagnosis not present

## 2022-12-10 DIAGNOSIS — D485 Neoplasm of uncertain behavior of skin: Secondary | ICD-10-CM | POA: Diagnosis not present

## 2022-12-10 DIAGNOSIS — Z85820 Personal history of malignant melanoma of skin: Secondary | ICD-10-CM | POA: Diagnosis not present

## 2022-12-10 DIAGNOSIS — Z85828 Personal history of other malignant neoplasm of skin: Secondary | ICD-10-CM | POA: Diagnosis not present

## 2022-12-10 DIAGNOSIS — D0439 Carcinoma in situ of skin of other parts of face: Secondary | ICD-10-CM | POA: Diagnosis not present

## 2022-12-23 DIAGNOSIS — M25651 Stiffness of right hip, not elsewhere classified: Secondary | ICD-10-CM | POA: Diagnosis not present

## 2022-12-23 DIAGNOSIS — R262 Difficulty in walking, not elsewhere classified: Secondary | ICD-10-CM | POA: Diagnosis not present

## 2022-12-23 DIAGNOSIS — Z471 Aftercare following joint replacement surgery: Secondary | ICD-10-CM | POA: Diagnosis not present

## 2022-12-29 DIAGNOSIS — Z471 Aftercare following joint replacement surgery: Secondary | ICD-10-CM | POA: Diagnosis not present

## 2022-12-29 DIAGNOSIS — M25651 Stiffness of right hip, not elsewhere classified: Secondary | ICD-10-CM | POA: Diagnosis not present

## 2022-12-29 DIAGNOSIS — R262 Difficulty in walking, not elsewhere classified: Secondary | ICD-10-CM | POA: Diagnosis not present

## 2023-01-02 DIAGNOSIS — Z471 Aftercare following joint replacement surgery: Secondary | ICD-10-CM | POA: Diagnosis not present

## 2023-01-02 DIAGNOSIS — R262 Difficulty in walking, not elsewhere classified: Secondary | ICD-10-CM | POA: Diagnosis not present

## 2023-01-02 DIAGNOSIS — M25651 Stiffness of right hip, not elsewhere classified: Secondary | ICD-10-CM | POA: Diagnosis not present

## 2023-01-06 DIAGNOSIS — Z471 Aftercare following joint replacement surgery: Secondary | ICD-10-CM | POA: Diagnosis not present

## 2023-01-06 DIAGNOSIS — R262 Difficulty in walking, not elsewhere classified: Secondary | ICD-10-CM | POA: Diagnosis not present

## 2023-01-06 DIAGNOSIS — M25651 Stiffness of right hip, not elsewhere classified: Secondary | ICD-10-CM | POA: Diagnosis not present

## 2023-01-07 DIAGNOSIS — M79651 Pain in right thigh: Secondary | ICD-10-CM | POA: Diagnosis not present

## 2023-01-07 DIAGNOSIS — M25562 Pain in left knee: Secondary | ICD-10-CM | POA: Diagnosis not present

## 2023-01-07 DIAGNOSIS — C4371 Malignant melanoma of right lower limb, including hip: Secondary | ICD-10-CM | POA: Diagnosis not present

## 2023-01-08 DIAGNOSIS — R262 Difficulty in walking, not elsewhere classified: Secondary | ICD-10-CM | POA: Diagnosis not present

## 2023-01-08 DIAGNOSIS — Z471 Aftercare following joint replacement surgery: Secondary | ICD-10-CM | POA: Diagnosis not present

## 2023-01-08 DIAGNOSIS — M25651 Stiffness of right hip, not elsewhere classified: Secondary | ICD-10-CM | POA: Diagnosis not present

## 2023-01-13 DIAGNOSIS — Z471 Aftercare following joint replacement surgery: Secondary | ICD-10-CM | POA: Diagnosis not present

## 2023-01-13 DIAGNOSIS — M25651 Stiffness of right hip, not elsewhere classified: Secondary | ICD-10-CM | POA: Diagnosis not present

## 2023-01-13 DIAGNOSIS — R262 Difficulty in walking, not elsewhere classified: Secondary | ICD-10-CM | POA: Diagnosis not present

## 2023-01-15 DIAGNOSIS — Z6825 Body mass index (BMI) 25.0-25.9, adult: Secondary | ICD-10-CM | POA: Diagnosis not present

## 2023-01-15 DIAGNOSIS — M5416 Radiculopathy, lumbar region: Secondary | ICD-10-CM | POA: Diagnosis not present

## 2023-01-16 DIAGNOSIS — Z471 Aftercare following joint replacement surgery: Secondary | ICD-10-CM | POA: Diagnosis not present

## 2023-01-16 DIAGNOSIS — M25651 Stiffness of right hip, not elsewhere classified: Secondary | ICD-10-CM | POA: Diagnosis not present

## 2023-01-16 DIAGNOSIS — R262 Difficulty in walking, not elsewhere classified: Secondary | ICD-10-CM | POA: Diagnosis not present

## 2023-01-20 DIAGNOSIS — Z471 Aftercare following joint replacement surgery: Secondary | ICD-10-CM | POA: Diagnosis not present

## 2023-01-20 DIAGNOSIS — R262 Difficulty in walking, not elsewhere classified: Secondary | ICD-10-CM | POA: Diagnosis not present

## 2023-01-20 DIAGNOSIS — M25651 Stiffness of right hip, not elsewhere classified: Secondary | ICD-10-CM | POA: Diagnosis not present

## 2023-01-28 DIAGNOSIS — M25562 Pain in left knee: Secondary | ICD-10-CM | POA: Diagnosis not present

## 2023-01-28 DIAGNOSIS — M170 Bilateral primary osteoarthritis of knee: Secondary | ICD-10-CM | POA: Diagnosis not present

## 2023-01-28 DIAGNOSIS — R262 Difficulty in walking, not elsewhere classified: Secondary | ICD-10-CM | POA: Diagnosis not present

## 2023-01-28 DIAGNOSIS — M25651 Stiffness of right hip, not elsewhere classified: Secondary | ICD-10-CM | POA: Diagnosis not present

## 2023-01-28 DIAGNOSIS — Z471 Aftercare following joint replacement surgery: Secondary | ICD-10-CM | POA: Diagnosis not present

## 2023-02-02 DIAGNOSIS — C44311 Basal cell carcinoma of skin of nose: Secondary | ICD-10-CM | POA: Diagnosis not present

## 2023-02-02 DIAGNOSIS — Z85828 Personal history of other malignant neoplasm of skin: Secondary | ICD-10-CM | POA: Diagnosis not present

## 2023-02-02 DIAGNOSIS — Z85820 Personal history of malignant melanoma of skin: Secondary | ICD-10-CM | POA: Diagnosis not present

## 2023-02-04 DIAGNOSIS — Z471 Aftercare following joint replacement surgery: Secondary | ICD-10-CM | POA: Diagnosis not present

## 2023-02-04 DIAGNOSIS — R262 Difficulty in walking, not elsewhere classified: Secondary | ICD-10-CM | POA: Diagnosis not present

## 2023-02-04 DIAGNOSIS — M5416 Radiculopathy, lumbar region: Secondary | ICD-10-CM | POA: Diagnosis not present

## 2023-02-04 DIAGNOSIS — M25651 Stiffness of right hip, not elsewhere classified: Secondary | ICD-10-CM | POA: Diagnosis not present

## 2023-02-05 DIAGNOSIS — C4371 Malignant melanoma of right lower limb, including hip: Secondary | ICD-10-CM | POA: Diagnosis not present

## 2023-02-05 DIAGNOSIS — Z006 Encounter for examination for normal comparison and control in clinical research program: Secondary | ICD-10-CM | POA: Diagnosis not present

## 2023-02-05 DIAGNOSIS — C499 Malignant neoplasm of connective and soft tissue, unspecified: Secondary | ICD-10-CM | POA: Diagnosis not present

## 2023-02-06 DIAGNOSIS — C439 Malignant melanoma of skin, unspecified: Secondary | ICD-10-CM | POA: Diagnosis not present

## 2023-02-06 DIAGNOSIS — C4371 Malignant melanoma of right lower limb, including hip: Secondary | ICD-10-CM | POA: Diagnosis not present

## 2023-02-09 DIAGNOSIS — L570 Actinic keratosis: Secondary | ICD-10-CM | POA: Diagnosis not present

## 2023-02-09 DIAGNOSIS — L812 Freckles: Secondary | ICD-10-CM | POA: Diagnosis not present

## 2023-02-09 DIAGNOSIS — L820 Inflamed seborrheic keratosis: Secondary | ICD-10-CM | POA: Diagnosis not present

## 2023-02-09 DIAGNOSIS — Z85820 Personal history of malignant melanoma of skin: Secondary | ICD-10-CM | POA: Diagnosis not present

## 2023-02-09 DIAGNOSIS — L821 Other seborrheic keratosis: Secondary | ICD-10-CM | POA: Diagnosis not present

## 2023-02-09 DIAGNOSIS — Z85828 Personal history of other malignant neoplasm of skin: Secondary | ICD-10-CM | POA: Diagnosis not present

## 2023-02-09 DIAGNOSIS — D1801 Hemangioma of skin and subcutaneous tissue: Secondary | ICD-10-CM | POA: Diagnosis not present

## 2023-02-10 DIAGNOSIS — R051 Acute cough: Secondary | ICD-10-CM | POA: Diagnosis not present

## 2023-02-10 DIAGNOSIS — J0110 Acute frontal sinusitis, unspecified: Secondary | ICD-10-CM | POA: Diagnosis not present

## 2023-02-10 DIAGNOSIS — C4371 Malignant melanoma of right lower limb, including hip: Secondary | ICD-10-CM | POA: Diagnosis not present

## 2023-02-10 DIAGNOSIS — J208 Acute bronchitis due to other specified organisms: Secondary | ICD-10-CM | POA: Diagnosis not present

## 2023-02-20 DIAGNOSIS — R5383 Other fatigue: Secondary | ICD-10-CM | POA: Diagnosis not present

## 2023-02-20 DIAGNOSIS — Z1152 Encounter for screening for COVID-19: Secondary | ICD-10-CM | POA: Diagnosis not present

## 2023-02-20 DIAGNOSIS — J0110 Acute frontal sinusitis, unspecified: Secondary | ICD-10-CM | POA: Diagnosis not present

## 2023-02-20 DIAGNOSIS — R0981 Nasal congestion: Secondary | ICD-10-CM | POA: Diagnosis not present

## 2023-02-20 DIAGNOSIS — I10 Essential (primary) hypertension: Secondary | ICD-10-CM | POA: Diagnosis not present

## 2023-02-20 DIAGNOSIS — R051 Acute cough: Secondary | ICD-10-CM | POA: Diagnosis not present

## 2023-02-20 DIAGNOSIS — J208 Acute bronchitis due to other specified organisms: Secondary | ICD-10-CM | POA: Diagnosis not present

## 2023-02-25 DIAGNOSIS — R262 Difficulty in walking, not elsewhere classified: Secondary | ICD-10-CM | POA: Diagnosis not present

## 2023-02-25 DIAGNOSIS — Z471 Aftercare following joint replacement surgery: Secondary | ICD-10-CM | POA: Diagnosis not present

## 2023-02-25 DIAGNOSIS — M25651 Stiffness of right hip, not elsewhere classified: Secondary | ICD-10-CM | POA: Diagnosis not present

## 2023-03-03 DIAGNOSIS — M25651 Stiffness of right hip, not elsewhere classified: Secondary | ICD-10-CM | POA: Diagnosis not present

## 2023-03-03 DIAGNOSIS — R262 Difficulty in walking, not elsewhere classified: Secondary | ICD-10-CM | POA: Diagnosis not present

## 2023-03-03 DIAGNOSIS — Z471 Aftercare following joint replacement surgery: Secondary | ICD-10-CM | POA: Diagnosis not present

## 2023-03-06 DIAGNOSIS — R262 Difficulty in walking, not elsewhere classified: Secondary | ICD-10-CM | POA: Diagnosis not present

## 2023-03-06 DIAGNOSIS — M25651 Stiffness of right hip, not elsewhere classified: Secondary | ICD-10-CM | POA: Diagnosis not present

## 2023-03-06 DIAGNOSIS — Z471 Aftercare following joint replacement surgery: Secondary | ICD-10-CM | POA: Diagnosis not present

## 2023-03-10 DIAGNOSIS — R262 Difficulty in walking, not elsewhere classified: Secondary | ICD-10-CM | POA: Diagnosis not present

## 2023-03-10 DIAGNOSIS — M25651 Stiffness of right hip, not elsewhere classified: Secondary | ICD-10-CM | POA: Diagnosis not present

## 2023-03-10 DIAGNOSIS — Z471 Aftercare following joint replacement surgery: Secondary | ICD-10-CM | POA: Diagnosis not present

## 2023-03-11 ENCOUNTER — Encounter (HOSPITAL_BASED_OUTPATIENT_CLINIC_OR_DEPARTMENT_OTHER): Payer: Self-pay

## 2023-03-11 ENCOUNTER — Emergency Department (HOSPITAL_BASED_OUTPATIENT_CLINIC_OR_DEPARTMENT_OTHER)
Admission: EM | Admit: 2023-03-11 | Discharge: 2023-03-11 | Disposition: A | Discharge status: Home / Self Care | Payer: PPO | Attending: Emergency Medicine | Admitting: Emergency Medicine

## 2023-03-11 ENCOUNTER — Other Ambulatory Visit: Payer: Self-pay

## 2023-03-11 DIAGNOSIS — R030 Elevated blood-pressure reading, without diagnosis of hypertension: Secondary | ICD-10-CM

## 2023-03-11 DIAGNOSIS — M79605 Pain in left leg: Secondary | ICD-10-CM | POA: Diagnosis not present

## 2023-03-11 DIAGNOSIS — I10 Essential (primary) hypertension: Secondary | ICD-10-CM | POA: Diagnosis not present

## 2023-03-11 MED ORDER — ACETAMINOPHEN 325 MG PO TABS
650.0000 mg | ORAL_TABLET | Freq: Once | ORAL | Status: AC
  Administered 2023-03-11: 650 mg via ORAL
  Filled 2023-03-11: qty 2

## 2023-03-11 MED ORDER — METHOCARBAMOL 750 MG PO TABS: 750.0000 mg | ORAL_TABLET | Freq: Three times a day (TID) | ORAL | 0 refills | Status: DC | PRN

## 2023-03-11 MED ORDER — METHOCARBAMOL 500 MG PO TABS
500.0000 mg | ORAL_TABLET | Freq: Once | ORAL | Status: AC
  Administered 2023-03-11: 500 mg via ORAL
  Filled 2023-03-11: qty 1

## 2023-03-11 NOTE — ED Provider Notes (Signed)
Claire Wall   CSN: [id number] Arrival date & time: 03/11/23  1956     History  Chief Complaint  Patient presents with   Leg Pain    L    Claire Wall is a 72 y.o. female.  Pt c/o of pain, burning sensation to posterior right upper and lower peg in past couple weeks. Occurs more often after prolonged sitting, as is better when up and walking or moving around. Hx sciatica, but indicates this feels different. No back or buttock pain. No leg swelling. Has not noticed any skin changes or redness. No hx dvt/pe. No trauma to leg. No claudication. No chest pain or sob.   The history is provided by the patient and medical records.  Leg Pain Associated symptoms: no back pain and no fever        Home Medications Prior to Admission medications   Medication Sig Start Date End Date Taking? Authorizing Provider  cetirizine (ZYRTEC ALLERGY) 10 MG tablet Take 1 tablet (10 mg total) by mouth daily as needed for allergies. 05/09/21   Davonna Belling, MD  Cholecalciferol (VITAMIN D) 125 MCG (5000 UT) CAPS Take 5,000 Units by mouth daily.    [provider]  Coenzyme Q10 (CO Q-10) 100 MG CAPS Take 100 mg by mouth daily.     [provider]  EPINEPHrine 0.3 mg/0.3 mL IJ SOAJ injection Inject 0.3 mg into the muscle as needed for anaphylaxis. 05/09/21   Davonna Belling, MD  meloxicam (MOBIC) 7.5 MG tablet Take 7.5 mg by mouth daily as needed for pain.    [provider]  Omega-3 Fatty Acids (FISH OIL TRIPLE STRENGTH PO) Take 2,400 mg by mouth daily.     [provider]  simvastatin (ZOCOR) 40 MG tablet Take 40 mg by mouth daily.  01/21/14   [provider]      Allergies    Cantharone [cantharidin] and Codeine    Review of Systems   Review of Systems  Constitutional:  Negative for fever.  Respiratory:  Negative for shortness of breath.   Cardiovascular:  Negative for chest pain and leg  swelling.  Gastrointestinal:  Negative for abdominal pain.  Musculoskeletal:  Negative for back pain.  Skin:  Negative for rash.  Neurological:  Negative for weakness and numbness.    Physical Exam Updated Vital Signs BP (!) 149/85 (BP Location: Right Arm)   Pulse 86   Temp 98.2 F (36.8 C)   Resp 18   LMP  (LMP Unknown)   SpO2 95%  Physical Exam Vitals and nursing Wall reviewed.  Constitutional:      Appearance: Normal appearance. She is well-developed.  HENT:     Head: Atraumatic.     Comments: No sinus or temporal tenderness.     Nose: Nose normal.     Mouth/Throat:     Mouth: Mucous membranes are moist.  Eyes:     General: No scleral icterus.    Conjunctiva/sclera: Conjunctivae normal.     Pupils: Pupils are equal, round, and reactive to light.  Neck:     Trachea: No tracheal deviation.     Comments: No stiffness or rigidity.  Cardiovascular:     Rate and Rhythm: Normal rate and regular rhythm.     Pulses: Normal pulses.     Heart sounds: Normal heart sounds. No murmur heard.    No friction rub. No gallop.  Pulmonary:     Effort: Pulmonary effort  is normal. No respiratory distress.     Breath sounds: Normal breath sounds.  Abdominal:     General: There is no distension.     Palpations: Abdomen is soft. There is no mass.     Tenderness: There is no abdominal tenderness.  Genitourinary:    Comments: No cva tenderness.  Musculoskeletal:        General: No swelling.     Cervical back: Normal range of motion and neck supple. No rigidity. No muscular tenderness.     Comments: T/L/S spine non tender, aligned. No pain/tenderness at left sciatic notch.   Skin:    General: Skin is warm and dry.     Findings: No rash.  Neurological:     Mental Status: She is alert.     Comments: Alert, speech normal. LLE nvi with intact motor/sens fxn. LLE of normal appearance, color and warmth. Distal pulses palp. No pain w passive rom at hip, knee or ankle.   Psychiatric:         Mood and Affect: Mood normal.     ED Results / Procedures / Treatments   Labs (all labs ordered are listed, but only abnormal results are displayed) Labs Reviewed - No data to display  EKG None  Radiology No results found.  Procedures Procedures    Medications Ordered in ED Medications  acetaminophen (TYLENOL) tablet 650 mg (has no administration in time range)  methocarbamol (ROBAXIN) tablet 500 mg (has no administration in time range)    ED Course/ Medical Decision Making/ A&P                             Medical Decision Making Problems Addressed: Elevated blood pressure reading: acute illness or injury Left leg pain: acute illness or injury  Amount and/or Complexity of Data Reviewed External Data Reviewed: notes.  Risk OTC drugs. Prescription drug management.   Acetaminophen po. Robaxin po.   Reviewed nursing notes and prior charts for additional history.   Pt expresses interesting in dvt study.  U/s currently not available here. No swelling to leg noted.   Will have return in AM for dvt study.  Return precautions provided.           Final Clinical Impression(s) / ED Diagnoses Final diagnoses:  None    Rx / DC Orders ED Discharge Orders     None         Lajean Saver, MD 03/11/23 2236

## 2023-03-11 NOTE — Discharge Instructions (Addendum)
It was our pleasure to provide your ER care today - we hope that you feel better.  Take acetaminophen or ibuprofen as need. You may also take robaxin as need for muscle pain/spasm - no driving when taking.  Return in AM for ultrasound to rule out dvt.     Return to ER right if worse, severe pain, chest pain, trouble breathing, or other emergency concern.

## 2023-03-11 NOTE — ED Triage Notes (Signed)
Pt c/o constant "burning" L leg pain since Sunday "in 3 places" indicating hamstrings, calf area. States she has has intermittent pain x "a couple weeks, but all the time now." Pt states she is concerned for DVT but denies redness, swelling, heat.

## 2023-03-12 ENCOUNTER — Ambulatory Visit (HOSPITAL_BASED_OUTPATIENT_CLINIC_OR_DEPARTMENT_OTHER)
Admission: RE | Admit: 2023-03-12 | Discharge: 2023-03-12 | Disposition: A | Discharge status: Home / Self Care | Payer: PPO | Source: Ambulatory Visit | Attending: Emergency Medicine | Admitting: Emergency Medicine

## 2023-03-12 DIAGNOSIS — M79605 Pain in left leg: Secondary | ICD-10-CM | POA: Diagnosis not present

## 2023-03-13 DIAGNOSIS — M545 Low back pain, unspecified: Secondary | ICD-10-CM | POA: Diagnosis not present

## 2023-03-13 DIAGNOSIS — M25651 Stiffness of right hip, not elsewhere classified: Secondary | ICD-10-CM | POA: Diagnosis not present

## 2023-03-13 DIAGNOSIS — R262 Difficulty in walking, not elsewhere classified: Secondary | ICD-10-CM | POA: Diagnosis not present

## 2023-03-13 DIAGNOSIS — J208 Acute bronchitis due to other specified organisms: Secondary | ICD-10-CM | POA: Diagnosis not present

## 2023-03-13 DIAGNOSIS — Z471 Aftercare following joint replacement surgery: Secondary | ICD-10-CM | POA: Diagnosis not present

## 2023-03-13 DIAGNOSIS — I10 Essential (primary) hypertension: Secondary | ICD-10-CM | POA: Diagnosis not present

## 2023-03-13 DIAGNOSIS — C4371 Malignant melanoma of right lower limb, including hip: Secondary | ICD-10-CM | POA: Diagnosis not present

## 2023-03-17 ENCOUNTER — Telehealth: Payer: Self-pay

## 2023-03-17 DIAGNOSIS — M25651 Stiffness of right hip, not elsewhere classified: Secondary | ICD-10-CM | POA: Diagnosis not present

## 2023-03-17 DIAGNOSIS — R262 Difficulty in walking, not elsewhere classified: Secondary | ICD-10-CM | POA: Diagnosis not present

## 2023-03-17 DIAGNOSIS — Z471 Aftercare following joint replacement surgery: Secondary | ICD-10-CM | POA: Diagnosis not present

## 2023-03-17 NOTE — Telephone Encounter (Signed)
        Patient  visited [facility] on 03/11/2023  for left leg  pain.   Telephone encounter attempt :  1st  A HIPAA compliant voice message was left requesting a return call.  Instructed patient to call back at [phone].   [facility] Resource Care Guide   [email or web address]  ?? [id number]   Website: [email or web address]  [email or web address]

## 2023-03-18 ENCOUNTER — Telehealth: Payer: Self-pay

## 2023-03-18 NOTE — Telephone Encounter (Signed)
     Patient  visit on 03/11/2023  at [facility] was for left leg pain.  Have you been able to follow up with your primary care physician? Yes  The patient was or was not able to obtain any needed medicine or equipment. Patient obtained medication.  Are there diet recommendations that you are having difficulty following? No  Patient expresses understanding of discharge instructions and education provided has no other needs at this time. Yes   [facility] Resource Care Guide   [email or web address]  ?? [phone]   Website: [email or web address]  [email or web address]

## 2023-03-20 DIAGNOSIS — R262 Difficulty in walking, not elsewhere classified: Secondary | ICD-10-CM | POA: Diagnosis not present

## 2023-03-20 DIAGNOSIS — Z471 Aftercare following joint replacement surgery: Secondary | ICD-10-CM | POA: Diagnosis not present

## 2023-03-20 DIAGNOSIS — M25651 Stiffness of right hip, not elsewhere classified: Secondary | ICD-10-CM | POA: Diagnosis not present

## 2023-03-27 DIAGNOSIS — M25651 Stiffness of right hip, not elsewhere classified: Secondary | ICD-10-CM | POA: Diagnosis not present

## 2023-03-27 DIAGNOSIS — Z471 Aftercare following joint replacement surgery: Secondary | ICD-10-CM | POA: Diagnosis not present

## 2023-03-27 DIAGNOSIS — R262 Difficulty in walking, not elsewhere classified: Secondary | ICD-10-CM | POA: Diagnosis not present

## 2023-03-31 DIAGNOSIS — Z471 Aftercare following joint replacement surgery: Secondary | ICD-10-CM | POA: Diagnosis not present

## 2023-03-31 DIAGNOSIS — R262 Difficulty in walking, not elsewhere classified: Secondary | ICD-10-CM | POA: Diagnosis not present

## 2023-03-31 DIAGNOSIS — M25651 Stiffness of right hip, not elsewhere classified: Secondary | ICD-10-CM | POA: Diagnosis not present

## 2023-03-31 DIAGNOSIS — M5416 Radiculopathy, lumbar region: Secondary | ICD-10-CM | POA: Diagnosis not present

## 2023-03-31 DIAGNOSIS — M545 Low back pain, unspecified: Secondary | ICD-10-CM | POA: Diagnosis not present

## 2023-03-31 DIAGNOSIS — R200 Anesthesia of skin: Secondary | ICD-10-CM | POA: Diagnosis not present

## 2023-03-31 DIAGNOSIS — R202 Paresthesia of skin: Secondary | ICD-10-CM | POA: Diagnosis not present

## 2023-04-03 DIAGNOSIS — R2689 Other abnormalities of gait and mobility: Secondary | ICD-10-CM | POA: Diagnosis not present

## 2023-04-03 DIAGNOSIS — M25551 Pain in right hip: Secondary | ICD-10-CM | POA: Diagnosis not present

## 2023-04-03 DIAGNOSIS — M4306 Spondylolysis, lumbar region: Secondary | ICD-10-CM | POA: Diagnosis not present

## 2023-04-03 DIAGNOSIS — M25651 Stiffness of right hip, not elsewhere classified: Secondary | ICD-10-CM | POA: Diagnosis not present

## 2023-04-07 DIAGNOSIS — R262 Difficulty in walking, not elsewhere classified: Secondary | ICD-10-CM | POA: Diagnosis not present

## 2023-04-07 DIAGNOSIS — Z471 Aftercare following joint replacement surgery: Secondary | ICD-10-CM | POA: Diagnosis not present

## 2023-04-07 DIAGNOSIS — M25651 Stiffness of right hip, not elsewhere classified: Secondary | ICD-10-CM | POA: Diagnosis not present

## 2023-04-08 DIAGNOSIS — M5416 Radiculopathy, lumbar region: Secondary | ICD-10-CM | POA: Diagnosis not present

## 2023-04-10 DIAGNOSIS — R262 Difficulty in walking, not elsewhere classified: Secondary | ICD-10-CM | POA: Diagnosis not present

## 2023-04-10 DIAGNOSIS — Z471 Aftercare following joint replacement surgery: Secondary | ICD-10-CM | POA: Diagnosis not present

## 2023-04-10 DIAGNOSIS — M25651 Stiffness of right hip, not elsewhere classified: Secondary | ICD-10-CM | POA: Diagnosis not present

## 2023-04-13 DIAGNOSIS — C4371 Malignant melanoma of right lower limb, including hip: Secondary | ICD-10-CM | POA: Diagnosis not present

## 2023-04-13 DIAGNOSIS — M545 Low back pain, unspecified: Secondary | ICD-10-CM | POA: Diagnosis not present

## 2023-04-13 DIAGNOSIS — M25651 Stiffness of right hip, not elsewhere classified: Secondary | ICD-10-CM | POA: Diagnosis not present

## 2023-04-13 DIAGNOSIS — Z471 Aftercare following joint replacement surgery: Secondary | ICD-10-CM | POA: Diagnosis not present

## 2023-04-13 DIAGNOSIS — J208 Acute bronchitis due to other specified organisms: Secondary | ICD-10-CM | POA: Diagnosis not present

## 2023-04-13 DIAGNOSIS — R262 Difficulty in walking, not elsewhere classified: Secondary | ICD-10-CM | POA: Diagnosis not present

## 2023-04-13 DIAGNOSIS — I10 Essential (primary) hypertension: Secondary | ICD-10-CM | POA: Diagnosis not present

## 2023-04-17 DIAGNOSIS — Z471 Aftercare following joint replacement surgery: Secondary | ICD-10-CM | POA: Diagnosis not present

## 2023-04-17 DIAGNOSIS — M25651 Stiffness of right hip, not elsewhere classified: Secondary | ICD-10-CM | POA: Diagnosis not present

## 2023-04-17 DIAGNOSIS — R262 Difficulty in walking, not elsewhere classified: Secondary | ICD-10-CM | POA: Diagnosis not present

## 2023-04-21 DIAGNOSIS — R262 Difficulty in walking, not elsewhere classified: Secondary | ICD-10-CM | POA: Diagnosis not present

## 2023-04-21 DIAGNOSIS — M25651 Stiffness of right hip, not elsewhere classified: Secondary | ICD-10-CM | POA: Diagnosis not present

## 2023-04-21 DIAGNOSIS — Z471 Aftercare following joint replacement surgery: Secondary | ICD-10-CM | POA: Diagnosis not present

## 2023-04-23 DIAGNOSIS — R0602 Shortness of breath: Secondary | ICD-10-CM | POA: Diagnosis not present

## 2023-04-23 DIAGNOSIS — Z1152 Encounter for screening for COVID-19: Secondary | ICD-10-CM | POA: Diagnosis not present

## 2023-04-23 DIAGNOSIS — J209 Acute bronchitis, unspecified: Secondary | ICD-10-CM | POA: Diagnosis not present

## 2023-04-23 DIAGNOSIS — R051 Acute cough: Secondary | ICD-10-CM | POA: Diagnosis not present

## 2023-04-23 DIAGNOSIS — J019 Acute sinusitis, unspecified: Secondary | ICD-10-CM | POA: Diagnosis not present

## 2023-04-23 DIAGNOSIS — I10 Essential (primary) hypertension: Secondary | ICD-10-CM | POA: Diagnosis not present

## 2023-04-24 DIAGNOSIS — M25651 Stiffness of right hip, not elsewhere classified: Secondary | ICD-10-CM | POA: Diagnosis not present

## 2023-04-24 DIAGNOSIS — M25562 Pain in left knee: Secondary | ICD-10-CM | POA: Diagnosis not present

## 2023-04-24 DIAGNOSIS — R262 Difficulty in walking, not elsewhere classified: Secondary | ICD-10-CM | POA: Diagnosis not present

## 2023-04-24 DIAGNOSIS — M1712 Unilateral primary osteoarthritis, left knee: Secondary | ICD-10-CM | POA: Diagnosis not present

## 2023-04-24 DIAGNOSIS — Z471 Aftercare following joint replacement surgery: Secondary | ICD-10-CM | POA: Diagnosis not present

## 2023-04-30 DIAGNOSIS — R0602 Shortness of breath: Secondary | ICD-10-CM | POA: Diagnosis not present

## 2023-04-30 DIAGNOSIS — C4371 Malignant melanoma of right lower limb, including hip: Secondary | ICD-10-CM | POA: Diagnosis not present

## 2023-04-30 DIAGNOSIS — Z006 Encounter for examination for normal comparison and control in clinical research program: Secondary | ICD-10-CM | POA: Diagnosis not present

## 2023-05-04 DIAGNOSIS — M5416 Radiculopathy, lumbar region: Secondary | ICD-10-CM | POA: Diagnosis not present

## 2023-05-05 ENCOUNTER — Other Ambulatory Visit: Payer: Self-pay | Admitting: Internal Medicine

## 2023-05-05 DIAGNOSIS — Z1231 Encounter for screening mammogram for malignant neoplasm of breast: Secondary | ICD-10-CM

## 2023-05-05 DIAGNOSIS — Z471 Aftercare following joint replacement surgery: Secondary | ICD-10-CM | POA: Diagnosis not present

## 2023-05-05 DIAGNOSIS — M25651 Stiffness of right hip, not elsewhere classified: Secondary | ICD-10-CM | POA: Diagnosis not present

## 2023-05-05 DIAGNOSIS — R262 Difficulty in walking, not elsewhere classified: Secondary | ICD-10-CM | POA: Diagnosis not present

## 2023-05-13 DIAGNOSIS — J984 Other disorders of lung: Secondary | ICD-10-CM | POA: Diagnosis not present

## 2023-05-13 DIAGNOSIS — R918 Other nonspecific abnormal finding of lung field: Secondary | ICD-10-CM | POA: Diagnosis not present

## 2023-05-13 DIAGNOSIS — R0602 Shortness of breath: Secondary | ICD-10-CM | POA: Diagnosis not present

## 2023-05-13 DIAGNOSIS — J849 Interstitial pulmonary disease, unspecified: Secondary | ICD-10-CM | POA: Diagnosis not present

## 2023-05-15 DIAGNOSIS — Z471 Aftercare following joint replacement surgery: Secondary | ICD-10-CM | POA: Diagnosis not present

## 2023-05-15 DIAGNOSIS — R262 Difficulty in walking, not elsewhere classified: Secondary | ICD-10-CM | POA: Diagnosis not present

## 2023-05-15 DIAGNOSIS — M25651 Stiffness of right hip, not elsewhere classified: Secondary | ICD-10-CM | POA: Diagnosis not present

## 2023-05-19 DIAGNOSIS — M792 Neuralgia and neuritis, unspecified: Secondary | ICD-10-CM | POA: Diagnosis not present

## 2023-05-19 DIAGNOSIS — M25651 Stiffness of right hip, not elsewhere classified: Secondary | ICD-10-CM | POA: Diagnosis not present

## 2023-05-19 DIAGNOSIS — Z471 Aftercare following joint replacement surgery: Secondary | ICD-10-CM | POA: Diagnosis not present

## 2023-05-19 DIAGNOSIS — R262 Difficulty in walking, not elsewhere classified: Secondary | ICD-10-CM | POA: Diagnosis not present

## 2023-05-19 DIAGNOSIS — M545 Low back pain, unspecified: Secondary | ICD-10-CM | POA: Diagnosis not present

## 2023-05-19 DIAGNOSIS — I10 Essential (primary) hypertension: Secondary | ICD-10-CM | POA: Diagnosis not present

## 2023-05-19 DIAGNOSIS — C4371 Malignant melanoma of right lower limb, including hip: Secondary | ICD-10-CM | POA: Diagnosis not present

## 2023-05-20 DIAGNOSIS — M25651 Stiffness of right hip, not elsewhere classified: Secondary | ICD-10-CM | POA: Diagnosis not present

## 2023-05-20 DIAGNOSIS — R262 Difficulty in walking, not elsewhere classified: Secondary | ICD-10-CM | POA: Diagnosis not present

## 2023-05-20 DIAGNOSIS — Z471 Aftercare following joint replacement surgery: Secondary | ICD-10-CM | POA: Diagnosis not present

## 2023-05-20 DIAGNOSIS — M5416 Radiculopathy, lumbar region: Secondary | ICD-10-CM | POA: Diagnosis not present

## 2023-05-20 DIAGNOSIS — Z6825 Body mass index (BMI) 25.0-25.9, adult: Secondary | ICD-10-CM | POA: Diagnosis not present

## 2023-06-08 DIAGNOSIS — M25651 Stiffness of right hip, not elsewhere classified: Secondary | ICD-10-CM | POA: Diagnosis not present

## 2023-06-08 DIAGNOSIS — Z471 Aftercare following joint replacement surgery: Secondary | ICD-10-CM | POA: Diagnosis not present

## 2023-06-08 DIAGNOSIS — R262 Difficulty in walking, not elsewhere classified: Secondary | ICD-10-CM | POA: Diagnosis not present

## 2023-06-09 DIAGNOSIS — Z471 Aftercare following joint replacement surgery: Secondary | ICD-10-CM | POA: Diagnosis not present

## 2023-06-09 DIAGNOSIS — R262 Difficulty in walking, not elsewhere classified: Secondary | ICD-10-CM | POA: Diagnosis not present

## 2023-06-09 DIAGNOSIS — M25651 Stiffness of right hip, not elsewhere classified: Secondary | ICD-10-CM | POA: Diagnosis not present

## 2023-06-10 DIAGNOSIS — M5126 Other intervertebral disc displacement, lumbar region: Secondary | ICD-10-CM | POA: Diagnosis not present

## 2023-06-10 DIAGNOSIS — M5117 Intervertebral disc disorders with radiculopathy, lumbosacral region: Secondary | ICD-10-CM | POA: Diagnosis not present

## 2023-06-23 DIAGNOSIS — Z471 Aftercare following joint replacement surgery: Secondary | ICD-10-CM | POA: Diagnosis not present

## 2023-06-23 DIAGNOSIS — R262 Difficulty in walking, not elsewhere classified: Secondary | ICD-10-CM | POA: Diagnosis not present

## 2023-06-23 DIAGNOSIS — M25651 Stiffness of right hip, not elsewhere classified: Secondary | ICD-10-CM | POA: Diagnosis not present

## 2023-06-24 DIAGNOSIS — L570 Actinic keratosis: Secondary | ICD-10-CM | POA: Diagnosis not present

## 2023-06-24 DIAGNOSIS — L821 Other seborrheic keratosis: Secondary | ICD-10-CM | POA: Diagnosis not present

## 2023-06-24 DIAGNOSIS — Z85820 Personal history of malignant melanoma of skin: Secondary | ICD-10-CM | POA: Diagnosis not present

## 2023-06-24 DIAGNOSIS — D2271 Melanocytic nevi of right lower limb, including hip: Secondary | ICD-10-CM | POA: Diagnosis not present

## 2023-06-24 DIAGNOSIS — L812 Freckles: Secondary | ICD-10-CM | POA: Diagnosis not present

## 2023-06-24 DIAGNOSIS — D1801 Hemangioma of skin and subcutaneous tissue: Secondary | ICD-10-CM | POA: Diagnosis not present

## 2023-06-24 DIAGNOSIS — D225 Melanocytic nevi of trunk: Secondary | ICD-10-CM | POA: Diagnosis not present

## 2023-06-24 DIAGNOSIS — D2272 Melanocytic nevi of left lower limb, including hip: Secondary | ICD-10-CM | POA: Diagnosis not present

## 2023-06-24 DIAGNOSIS — Z85828 Personal history of other malignant neoplasm of skin: Secondary | ICD-10-CM | POA: Diagnosis not present

## 2023-06-30 DIAGNOSIS — Z471 Aftercare following joint replacement surgery: Secondary | ICD-10-CM | POA: Diagnosis not present

## 2023-06-30 DIAGNOSIS — M25651 Stiffness of right hip, not elsewhere classified: Secondary | ICD-10-CM | POA: Diagnosis not present

## 2023-06-30 DIAGNOSIS — R262 Difficulty in walking, not elsewhere classified: Secondary | ICD-10-CM | POA: Diagnosis not present

## 2023-07-01 DIAGNOSIS — K5730 Diverticulosis of large intestine without perforation or abscess without bleeding: Secondary | ICD-10-CM | POA: Diagnosis not present

## 2023-07-01 DIAGNOSIS — R933 Abnormal findings on diagnostic imaging of other parts of digestive tract: Secondary | ICD-10-CM | POA: Diagnosis not present

## 2023-07-01 DIAGNOSIS — D490 Neoplasm of unspecified behavior of digestive system: Secondary | ICD-10-CM | POA: Diagnosis not present

## 2023-07-01 DIAGNOSIS — Z86010 Personal history of colonic polyps: Secondary | ICD-10-CM | POA: Diagnosis not present

## 2023-07-01 DIAGNOSIS — D120 Benign neoplasm of cecum: Secondary | ICD-10-CM | POA: Diagnosis not present

## 2023-07-01 DIAGNOSIS — Z1231 Encounter for screening mammogram for malignant neoplasm of breast: Secondary | ICD-10-CM

## 2023-07-02 DIAGNOSIS — Z96641 Presence of right artificial hip joint: Secondary | ICD-10-CM | POA: Diagnosis not present

## 2023-07-02 DIAGNOSIS — Z471 Aftercare following joint replacement surgery: Secondary | ICD-10-CM | POA: Diagnosis not present

## 2023-07-03 DIAGNOSIS — R262 Difficulty in walking, not elsewhere classified: Secondary | ICD-10-CM | POA: Diagnosis not present

## 2023-07-03 DIAGNOSIS — M25651 Stiffness of right hip, not elsewhere classified: Secondary | ICD-10-CM | POA: Diagnosis not present

## 2023-07-03 DIAGNOSIS — D120 Benign neoplasm of cecum: Secondary | ICD-10-CM | POA: Diagnosis not present

## 2023-07-03 DIAGNOSIS — Z471 Aftercare following joint replacement surgery: Secondary | ICD-10-CM | POA: Diagnosis not present

## 2023-07-07 DIAGNOSIS — M25651 Stiffness of right hip, not elsewhere classified: Secondary | ICD-10-CM | POA: Diagnosis not present

## 2023-07-07 DIAGNOSIS — Z471 Aftercare following joint replacement surgery: Secondary | ICD-10-CM | POA: Diagnosis not present

## 2023-07-07 DIAGNOSIS — R262 Difficulty in walking, not elsewhere classified: Secondary | ICD-10-CM | POA: Diagnosis not present

## 2023-07-08 ENCOUNTER — Ambulatory Visit
Admission: RE | Admit: 2023-07-08 | Discharge: 2023-07-08 | Disposition: A | Discharge status: Home / Self Care | Payer: PPO | Source: Ambulatory Visit | Attending: Internal Medicine | Admitting: Internal Medicine

## 2023-07-08 DIAGNOSIS — Z1231 Encounter for screening mammogram for malignant neoplasm of breast: Secondary | ICD-10-CM

## 2023-07-10 DIAGNOSIS — R262 Difficulty in walking, not elsewhere classified: Secondary | ICD-10-CM | POA: Diagnosis not present

## 2023-07-10 DIAGNOSIS — Z471 Aftercare following joint replacement surgery: Secondary | ICD-10-CM | POA: Diagnosis not present

## 2023-07-10 DIAGNOSIS — M25651 Stiffness of right hip, not elsewhere classified: Secondary | ICD-10-CM | POA: Diagnosis not present

## 2023-07-13 DIAGNOSIS — R7989 Other specified abnormal findings of blood chemistry: Secondary | ICD-10-CM | POA: Diagnosis not present

## 2023-07-13 DIAGNOSIS — E875 Hyperkalemia: Secondary | ICD-10-CM | POA: Diagnosis not present

## 2023-07-13 DIAGNOSIS — E7800 Pure hypercholesterolemia, unspecified: Secondary | ICD-10-CM | POA: Diagnosis not present

## 2023-07-14 DIAGNOSIS — R262 Difficulty in walking, not elsewhere classified: Secondary | ICD-10-CM | POA: Diagnosis not present

## 2023-07-14 DIAGNOSIS — Z471 Aftercare following joint replacement surgery: Secondary | ICD-10-CM | POA: Diagnosis not present

## 2023-07-14 DIAGNOSIS — M25651 Stiffness of right hip, not elsewhere classified: Secondary | ICD-10-CM | POA: Diagnosis not present

## 2023-07-15 DIAGNOSIS — Z6825 Body mass index (BMI) 25.0-25.9, adult: Secondary | ICD-10-CM | POA: Diagnosis not present

## 2023-07-15 DIAGNOSIS — Z01419 Encounter for gynecological examination (general) (routine) without abnormal findings: Secondary | ICD-10-CM | POA: Diagnosis not present

## 2023-07-17 DIAGNOSIS — R262 Difficulty in walking, not elsewhere classified: Secondary | ICD-10-CM | POA: Diagnosis not present

## 2023-07-17 DIAGNOSIS — Z471 Aftercare following joint replacement surgery: Secondary | ICD-10-CM | POA: Diagnosis not present

## 2023-07-17 DIAGNOSIS — M25651 Stiffness of right hip, not elsewhere classified: Secondary | ICD-10-CM | POA: Diagnosis not present

## 2023-07-20 DIAGNOSIS — M25511 Pain in right shoulder: Secondary | ICD-10-CM | POA: Diagnosis not present

## 2023-07-20 DIAGNOSIS — I700 Atherosclerosis of aorta: Secondary | ICD-10-CM | POA: Diagnosis not present

## 2023-07-20 DIAGNOSIS — R7989 Other specified abnormal findings of blood chemistry: Secondary | ICD-10-CM | POA: Diagnosis not present

## 2023-07-20 DIAGNOSIS — I10 Essential (primary) hypertension: Secondary | ICD-10-CM | POA: Diagnosis not present

## 2023-07-20 DIAGNOSIS — M545 Low back pain, unspecified: Secondary | ICD-10-CM | POA: Diagnosis not present

## 2023-07-20 DIAGNOSIS — E7800 Pure hypercholesterolemia, unspecified: Secondary | ICD-10-CM | POA: Diagnosis not present

## 2023-07-20 DIAGNOSIS — M792 Neuralgia and neuritis, unspecified: Secondary | ICD-10-CM | POA: Diagnosis not present

## 2023-07-20 DIAGNOSIS — M25562 Pain in left knee: Secondary | ICD-10-CM | POA: Diagnosis not present

## 2023-07-20 DIAGNOSIS — M8580 Other specified disorders of bone density and structure, unspecified site: Secondary | ICD-10-CM | POA: Diagnosis not present

## 2023-07-20 DIAGNOSIS — Z1339 Encounter for screening examination for other mental health and behavioral disorders: Secondary | ICD-10-CM | POA: Diagnosis not present

## 2023-07-20 DIAGNOSIS — M7122 Synovial cyst of popliteal space [Baker], left knee: Secondary | ICD-10-CM | POA: Diagnosis not present

## 2023-07-20 DIAGNOSIS — R82998 Other abnormal findings in urine: Secondary | ICD-10-CM | POA: Diagnosis not present

## 2023-07-20 DIAGNOSIS — Z23 Encounter for immunization: Secondary | ICD-10-CM | POA: Diagnosis not present

## 2023-07-20 DIAGNOSIS — Z1331 Encounter for screening for depression: Secondary | ICD-10-CM | POA: Diagnosis not present

## 2023-07-20 DIAGNOSIS — M170 Bilateral primary osteoarthritis of knee: Secondary | ICD-10-CM | POA: Diagnosis not present

## 2023-07-20 DIAGNOSIS — Z0000 Encounter for general adult medical examination without abnormal findings: Secondary | ICD-10-CM | POA: Diagnosis not present

## 2023-07-20 DIAGNOSIS — C4371 Malignant melanoma of right lower limb, including hip: Secondary | ICD-10-CM | POA: Diagnosis not present

## 2023-07-21 DIAGNOSIS — M25651 Stiffness of right hip, not elsewhere classified: Secondary | ICD-10-CM | POA: Diagnosis not present

## 2023-07-21 DIAGNOSIS — Z471 Aftercare following joint replacement surgery: Secondary | ICD-10-CM | POA: Diagnosis not present

## 2023-07-21 DIAGNOSIS — R262 Difficulty in walking, not elsewhere classified: Secondary | ICD-10-CM | POA: Diagnosis not present

## 2023-07-23 DIAGNOSIS — C4371 Malignant melanoma of right lower limb, including hip: Secondary | ICD-10-CM | POA: Diagnosis not present

## 2023-07-23 DIAGNOSIS — K5730 Diverticulosis of large intestine without perforation or abscess without bleeding: Secondary | ICD-10-CM | POA: Diagnosis not present

## 2023-07-23 DIAGNOSIS — K639 Disease of intestine, unspecified: Secondary | ICD-10-CM | POA: Diagnosis not present

## 2023-07-23 DIAGNOSIS — N2889 Other specified disorders of kidney and ureter: Secondary | ICD-10-CM | POA: Diagnosis not present

## 2023-07-23 DIAGNOSIS — Z006 Encounter for examination for normal comparison and control in clinical research program: Secondary | ICD-10-CM | POA: Diagnosis not present

## 2023-07-28 DIAGNOSIS — R262 Difficulty in walking, not elsewhere classified: Secondary | ICD-10-CM | POA: Diagnosis not present

## 2023-07-28 DIAGNOSIS — Z471 Aftercare following joint replacement surgery: Secondary | ICD-10-CM | POA: Diagnosis not present

## 2023-07-28 DIAGNOSIS — M25651 Stiffness of right hip, not elsewhere classified: Secondary | ICD-10-CM | POA: Diagnosis not present

## 2023-07-29 DIAGNOSIS — M9907 Segmental and somatic dysfunction of upper extremity: Secondary | ICD-10-CM | POA: Diagnosis not present

## 2023-07-29 DIAGNOSIS — M9902 Segmental and somatic dysfunction of thoracic region: Secondary | ICD-10-CM | POA: Diagnosis not present

## 2023-07-29 DIAGNOSIS — M9901 Segmental and somatic dysfunction of cervical region: Secondary | ICD-10-CM | POA: Diagnosis not present

## 2023-07-29 DIAGNOSIS — M9908 Segmental and somatic dysfunction of rib cage: Secondary | ICD-10-CM | POA: Diagnosis not present

## 2023-07-29 DIAGNOSIS — M25512 Pain in left shoulder: Secondary | ICD-10-CM | POA: Diagnosis not present

## 2023-08-04 DIAGNOSIS — M25651 Stiffness of right hip, not elsewhere classified: Secondary | ICD-10-CM | POA: Diagnosis not present

## 2023-08-04 DIAGNOSIS — Z471 Aftercare following joint replacement surgery: Secondary | ICD-10-CM | POA: Diagnosis not present

## 2023-08-04 DIAGNOSIS — R262 Difficulty in walking, not elsewhere classified: Secondary | ICD-10-CM | POA: Diagnosis not present

## 2023-08-10 DIAGNOSIS — R262 Difficulty in walking, not elsewhere classified: Secondary | ICD-10-CM | POA: Diagnosis not present

## 2023-08-10 DIAGNOSIS — Z471 Aftercare following joint replacement surgery: Secondary | ICD-10-CM | POA: Diagnosis not present

## 2023-08-10 DIAGNOSIS — M25651 Stiffness of right hip, not elsewhere classified: Secondary | ICD-10-CM | POA: Diagnosis not present

## 2023-08-12 ENCOUNTER — Other Ambulatory Visit (HOSPITAL_COMMUNITY): Payer: Self-pay

## 2023-08-12 MED ORDER — SIMVASTATIN 40 MG PO TABS
40.0000 mg | ORAL_TABLET | Freq: Every evening | ORAL | 3 refills | Status: DC
  Filled 2023-08-12: qty 90, 90d supply, fill #0
  Filled 2023-11-06: qty 90, 90d supply, fill #1

## 2023-08-13 DIAGNOSIS — R262 Difficulty in walking, not elsewhere classified: Secondary | ICD-10-CM | POA: Diagnosis not present

## 2023-08-13 DIAGNOSIS — M25651 Stiffness of right hip, not elsewhere classified: Secondary | ICD-10-CM | POA: Diagnosis not present

## 2023-08-13 DIAGNOSIS — Z471 Aftercare following joint replacement surgery: Secondary | ICD-10-CM | POA: Diagnosis not present

## 2023-08-17 DIAGNOSIS — L598 Other specified disorders of the skin and subcutaneous tissue related to radiation: Secondary | ICD-10-CM | POA: Diagnosis not present

## 2023-08-17 DIAGNOSIS — M792 Neuralgia and neuritis, unspecified: Secondary | ICD-10-CM | POA: Diagnosis not present

## 2023-08-17 DIAGNOSIS — C4371 Malignant melanoma of right lower limb, including hip: Secondary | ICD-10-CM | POA: Diagnosis not present

## 2023-08-18 ENCOUNTER — Encounter: Payer: Self-pay | Admitting: Physical Therapy

## 2023-08-18 ENCOUNTER — Ambulatory Visit: Payer: PPO | Attending: Physical Medicine & Rehabilitation | Admitting: Physical Therapy

## 2023-08-18 ENCOUNTER — Other Ambulatory Visit: Payer: Self-pay

## 2023-08-18 DIAGNOSIS — R262 Difficulty in walking, not elsewhere classified: Secondary | ICD-10-CM | POA: Diagnosis not present

## 2023-08-18 DIAGNOSIS — M25651 Stiffness of right hip, not elsewhere classified: Secondary | ICD-10-CM | POA: Diagnosis not present

## 2023-08-18 DIAGNOSIS — Y842 Radiological procedure and radiotherapy as the cause of abnormal reaction of the patient, or of later complication, without mention of misadventure at the time of the procedure: Secondary | ICD-10-CM

## 2023-08-18 DIAGNOSIS — I890 Lymphedema, not elsewhere classified: Secondary | ICD-10-CM

## 2023-08-18 DIAGNOSIS — L598 Other specified disorders of the skin and subcutaneous tissue related to radiation: Secondary | ICD-10-CM | POA: Diagnosis not present

## 2023-08-18 DIAGNOSIS — Z471 Aftercare following joint replacement surgery: Secondary | ICD-10-CM | POA: Diagnosis not present

## 2023-08-18 DIAGNOSIS — L599 Disorder of the skin and subcutaneous tissue related to radiation, unspecified: Secondary | ICD-10-CM

## 2023-08-18 NOTE — Therapy (Signed)
OUTPATIENT PHYSICAL THERAPY  LOWER EXTREMITY ONCOLOGY EVALUATION  Patient Name: Claire Wall MRN: [id number] DOB:1951/02/02, 72 y.o., female Today's Date: 08/18/2023  END OF SESSION:  PT End of Session - 08/18/23 1503     Visit Number 1    Number of Visits 11    Date for PT Re-Evaluation 09/22/23    PT Start Time 1404    PT Stop Time 1454    PT Time Calculation (min) 50 min    Activity Tolerance Patient tolerated treatment well    Behavior During Therapy [facility] for tasks assessed/performed             Past Medical History:  Diagnosis Date   Arthritis    knees, shoulder   Cancer (HCC) 08/2019   skin - melanoma right calf   Chest pain 2015   Resolved per patient - anxiety/stress related to work, now retired, no current problem    Hyperlipidemia    Hypertension    resolved per patient, no problems since patient retired, had a stressful job.   Pneumonia    x 1   Past Surgical History:  Procedure Laterality Date   APPENDECTOMY     bladder tack  2018   at [facility]   BREAST BIOPSY Left    COLONOSCOPY     polyps   ELBOW SURGERY Bilateral    x 2   FOOT SURGERY Left    bunion   MELANOMA EXCISION Right 09/18/2020   Procedure: WIDE LOCAL EXICISION WITH ADVANCEMENT FLAP CLOSURE RIGHT CALF MELANOMA;  Surgeon: Almond Lint, MD;  Location: [facility] OR;  Service: General;  Laterality: Right;   MELANOMA EXCISION WITH SENTINEL LYMPH NODE BIOPSY Right 10/13/2019   Procedure: WIDE LOCAL EXCISION WITH ADVANCEMENT FLAP CLOSURE AND SENTINEL LYMPH NODE BIOPSY;  Surgeon: Almond Lint, MD;  Location: [facility] OR;  Service: General;  Laterality: Right;   WISDOM TOOTH EXTRACTION     Patient Active Problem List   Diagnosis Date Noted   HTN (hypertension) 03/15/2014   Chest pain     PCP: Guerry Bruin, MD  REFERRING PROVIDER: Pixie Casino, MD  REFERRING DIAG:  Diagnosis L59.8 (ICD-10-CM) - Other specified disorders of the skin and subcutaneous tissue related to  radiation Y84.2 (ICD-10-CM) - Radiological procedure and radiotherapy as the cause of abnormal reaction of the patient, or of later complication, without mention of misadventure at the time of the procedure  THERAPY DIAG:  Disorder of the skin and subcutaneous tissue related to radiation, unspecified  Lymphedema, not elsewhere classified  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 I had 3 radiation treatments with nano particles in my R thigh. The particles were supposed to decrease the number of radiation treatments I would need. It was supposed to make Keytruda more effective. Last year in the fall the place where I had radiation was hard and sore to touch. Sometimes it burns and stings. I discussed with [facility] about wether or not red light therapy or something would work. I had my PT work on massaging it. I see a PT for my back and my knees and my hip. The immuno therapy attacked my joints so it destroyed cartilage in my hip. I had to have a R hip replacement. It caused increased arthritis in knees and back. I also go to a massage therapist who has done some massaging to this place to loosen it up. My oncologist told me in May that other people in the trial were having the same problem as me and he prescribe pentoxifylline to help with the fibrosis and after 5 days I stopped it. I had back surgery 06/10/23. Tried medicine again 08/03/23 - week of and I felt achy again.   PERTINENT HISTORY: 07/2019 - R calf melanoma excision with SLNB inguinal 0/2, 08/2020 R calf melanoma excision, 11/2020 R calf melanoma saw Dr. Azzie Almas Mochos in 12/2020 - had PET scan  which showed melanoma in the calf did T-VEC injections every 2 weeks from 12/2020-04/2021, found a nodule in R inguinal area and inner thigh, went on immunotherapy 06/2021,  just had a biopsy to remove node in groin 1/1, was on immunotherapy for 6 weeks and the place in the medial thigh was growing and another node was swollen in the groin, 07/2021 had genetic testing, 08/12/21 did radiation to inner R thigh which is when the nano particles were used as part of study to reduce the number of radiation treatments needed and improve effectiveness of immunotherapy, had 3 radiation treatments, did immunotherapy until 05/2022 when she had to have a R hip replacement in 08/2022 and now is monitored with PET scans  PAIN:  Are you having pain? Yes currently not burning and stinging NPRS scale: 1/10 Pain location: R inner thigh Pain orientation: Right and Upper  PAIN TYPE: tender Pain description:  tender   Aggravating factors: touching it, crossing legs Relieving factors: massage  PRECAUTIONS: Other: R THA 08/2022, micro discetomy 04/2023  RED FLAGS: None   WEIGHT BEARING RESTRICTIONS: No  FALLS:  Has patient fallen in last 6 months? No  LIVING ENVIRONMENT: Lives with: lives with their spouse Lives in: House/apartment Stairs: Yes; does not need to use them frequently in the house Has following equipment at home: Grab bars  OCCUPATION: retired  LEISURE: stretching and general strengthening 4-5x/wk for 15-20 min, walks  PRIOR LEVEL OF FUNCTION: Independent  PATIENT GOALS: to see if we can break down the fibrosis I have on my inner thigh and see if it can look any better   OBJECTIVE:  COGNITION: Overall cognitive status: Within functional limits for tasks assessed   PALPATION: Tightness and fibrosis palpable at medial thigh  OBSERVATIONS / OTHER ASSESSMENTS: indented skin at medial thigh, redness that pt reports has been there since radiation   LYMPHEDEMA ASSESSMENTS:   SURGERY TYPE/DATE:  2 wide excision biopsies of R calf in 2020 and SLNB (0/2), lymph node dissection (1/1) 2022  NUMBER OF LYMPH NODES REMOVED: 2 in 2020 both negative, 1 in 2022 that was positive  RADIATION:completed in 2022  LYMPHEDEMA ASSESSMENTS: will assess at next session, unable today due to time constraints  LOWER EXTREMITY LANDMARK RIGHT eval  At groin   30 cm proximal to suprapatella   20 cm proximal to suprapatella   10 cm proximal to suprapatella   At midpatella / popliteal crease   30 cm proximal to floor at lateral plantar foot   20 cm proximal to floor at lateral plantar foot   10 cm proximal to floor at lateral plantar foot   Circumference of ankle/heel   5 cm proximal to 1st MTP joint   Across MTP joint   Around proximal great toe   (Blank rows = not tested)  LOWER EXTREMITY LANDMARK LEFT eval  At groin   30 cm proximal to suprapatella   20 cm proximal to suprapatella   10 cm proximal to suprapatella   At midpatella / popliteal crease   30 cm proximal to floor at lateral plantar foot   20 cm proximal to floor at lateral plantar foot   10 cm proximal to floor at lateral plantar foot   Circumference of ankle/heel   5 cm proximal to 1st MTP joint   Across MTP joint   Around proximal great toe   (Blank rows = not tested)   TODAY'S TREATMENT:                                                                                                                                          DATE:  08/18/23: none today -eval only due to time constraints   PATIENT EDUCATION:  Education details: MLD and how this helps fibrosis, cupping to help improve skin mobility Person educated: Patient Education method: Explanation Education comprehension: verbalized understanding  HOME EXERCISE PROGRAM: None yet  ASSESSMENT:  CLINICAL IMPRESSION: Patient is a 72 y.o. female who was seen today for physical therapy evaluation and treatment for fibrosis, pain and sensitivity of R medial thigh  following nano particle radiation for treatment of melanoma. Pt reports her melanoma began in her R calf and she underwent 2 separate wide excisions and a SLNB in 2020. It recurred and that is when pt went to [facility] and was in a study for nano particles with radiation to decrease the number of radiation treatments required and to improve effects of immunotherapy. She had several recurrences since this time and had to have another groin node removed. She has been having pain and fibrosis in her upper medial thigh in area of radiation. There is decreased skin mobility in this area and she reports it is always red. The fibrosis extends deep in to her thigh and she has increased hip adduction on that side during gait. She would benefit from skilled PT services to decrease fibrosis and pain and to improve mobility and comfort.    OBJECTIVE IMPAIRMENTS: difficulty walking, increased edema, increased fascial restrictions, impaired sensation, and pain.   ACTIVITY  LIMITATIONS: sitting and sleeping  PARTICIPATION LIMITATIONS:  none  PERSONAL FACTORS: Time since onset of injury/illness/exacerbation are also affecting patient's functional outcome.   REHAB POTENTIAL: Good  CLINICAL DECISION MAKING: Stable/uncomplicated  EVALUATION COMPLEXITY: Low   GOALS: Goals reviewed with patient? Yes  SHORT TERM GOALS=LONG TERM GOALS Target date: 09/22/23  Pt will be independent in MLD for long term management of fibrosis and edema.  Baseline: Goal status: INITIAL  2.  Pt will report a 75% improvement in pain and discomfort in medial R thigh to allow her to cross her legs without pain. Baseline:  Goal status: INITIAL  3.  Pt will obtain a compression garment to decrease risk of lymphedema and help decrease discomfort.  Baseline:  Goal status: INITIAL  4.  Pt will have a 50% improvement in fibrosis in R medial thigh to allow improved comfort.  Baseline:  Goal status: INITIAL   PLAN:  PT FREQUENCY:  2x/week  PT DURATION: other: 5 weeks  PLANNED INTERVENTIONS: Therapeutic exercises, Therapeutic activity, Patient/Family education, Self Care, Orthotic/Fit training, Manual lymph drainage, Compression bandaging, scar mobilization, Taping, Vasopneumatic device, and Manual therapy  PLAN FOR NEXT SESSION: begin MLD to R LE, take circumference measurements, suction cupping to R thigh, MFR   [employer] Blue, PT 08/18/2023, 6:05 PM

## 2023-08-21 DIAGNOSIS — Z471 Aftercare following joint replacement surgery: Secondary | ICD-10-CM | POA: Diagnosis not present

## 2023-08-21 DIAGNOSIS — M25651 Stiffness of right hip, not elsewhere classified: Secondary | ICD-10-CM | POA: Diagnosis not present

## 2023-08-21 DIAGNOSIS — R262 Difficulty in walking, not elsewhere classified: Secondary | ICD-10-CM | POA: Diagnosis not present

## 2023-08-25 DIAGNOSIS — M25651 Stiffness of right hip, not elsewhere classified: Secondary | ICD-10-CM | POA: Diagnosis not present

## 2023-08-25 DIAGNOSIS — Z471 Aftercare following joint replacement surgery: Secondary | ICD-10-CM | POA: Diagnosis not present

## 2023-08-25 DIAGNOSIS — R262 Difficulty in walking, not elsewhere classified: Secondary | ICD-10-CM | POA: Diagnosis not present

## 2023-08-28 DIAGNOSIS — R262 Difficulty in walking, not elsewhere classified: Secondary | ICD-10-CM | POA: Diagnosis not present

## 2023-08-28 DIAGNOSIS — M25651 Stiffness of right hip, not elsewhere classified: Secondary | ICD-10-CM | POA: Diagnosis not present

## 2023-08-28 DIAGNOSIS — Z471 Aftercare following joint replacement surgery: Secondary | ICD-10-CM | POA: Diagnosis not present

## 2023-09-07 DIAGNOSIS — M170 Bilateral primary osteoarthritis of knee: Secondary | ICD-10-CM | POA: Diagnosis not present

## 2023-09-07 DIAGNOSIS — M9906 Segmental and somatic dysfunction of lower extremity: Secondary | ICD-10-CM | POA: Diagnosis not present

## 2023-09-07 DIAGNOSIS — M25562 Pain in left knee: Secondary | ICD-10-CM | POA: Diagnosis not present

## 2023-09-07 DIAGNOSIS — R269 Unspecified abnormalities of gait and mobility: Secondary | ICD-10-CM | POA: Diagnosis not present

## 2023-09-07 DIAGNOSIS — M25561 Pain in right knee: Secondary | ICD-10-CM | POA: Diagnosis not present

## 2023-09-08 ENCOUNTER — Encounter: Payer: Self-pay | Admitting: Physical Therapy

## 2023-09-08 ENCOUNTER — Ambulatory Visit: Payer: PPO | Attending: Physical Medicine & Rehabilitation | Admitting: Physical Therapy

## 2023-09-08 DIAGNOSIS — Y842 Radiological procedure and radiotherapy as the cause of abnormal reaction of the patient, or of later complication, without mention of misadventure at the time of the procedure: Secondary | ICD-10-CM | POA: Diagnosis not present

## 2023-09-08 DIAGNOSIS — Z471 Aftercare following joint replacement surgery: Secondary | ICD-10-CM | POA: Diagnosis not present

## 2023-09-08 DIAGNOSIS — M25651 Stiffness of right hip, not elsewhere classified: Secondary | ICD-10-CM | POA: Diagnosis not present

## 2023-09-08 DIAGNOSIS — L598 Other specified disorders of the skin and subcutaneous tissue related to radiation: Secondary | ICD-10-CM | POA: Insufficient documentation

## 2023-09-08 DIAGNOSIS — I890 Lymphedema, not elsewhere classified: Secondary | ICD-10-CM | POA: Diagnosis not present

## 2023-09-08 DIAGNOSIS — R262 Difficulty in walking, not elsewhere classified: Secondary | ICD-10-CM | POA: Diagnosis not present

## 2023-09-08 DIAGNOSIS — L599 Disorder of the skin and subcutaneous tissue related to radiation, unspecified: Secondary | ICD-10-CM | POA: Diagnosis not present

## 2023-09-08 NOTE — Therapy (Signed)
OUTPATIENT PHYSICAL THERAPY  LOWER EXTREMITY ONCOLOGY TREATMENT  Patient Name: Claire Wall MRN: [id number] DOB:September 19, 1951, 72 y.o., female Today's Date: 09/08/2023  END OF SESSION:  PT End of Session - 09/08/23 1505     Visit Number 2    Number of Visits 11    Date for PT Re-Evaluation 09/22/23    PT Start Time 1504    PT Stop Time 1553    PT Time Calculation (min) 49 min    Activity Tolerance Patient tolerated treatment well    Behavior During Therapy [facility] for tasks assessed/performed             Past Medical History:  Diagnosis Date   Arthritis    knees, shoulder   Cancer (HCC) 08/2019   skin - melanoma right calf   Chest pain 2015   Resolved per patient - anxiety/stress related to work, now retired, no current problem    Hyperlipidemia    Hypertension    resolved per patient, no problems since patient retired, had a stressful job.   Pneumonia    x 1   Past Surgical History:  Procedure Laterality Date   APPENDECTOMY     bladder tack  2018   at [facility]   BREAST BIOPSY Left    COLONOSCOPY     polyps   ELBOW SURGERY Bilateral    x 2   FOOT SURGERY Left    bunion   MELANOMA EXCISION Right 09/18/2020   Procedure: WIDE LOCAL EXICISION WITH ADVANCEMENT FLAP CLOSURE RIGHT CALF MELANOMA;  Surgeon: Almond Lint, MD;  Location: [facility] OR;  Service: General;  Laterality: Right;   MELANOMA EXCISION WITH SENTINEL LYMPH NODE BIOPSY Right 10/13/2019   Procedure: WIDE LOCAL EXCISION WITH ADVANCEMENT FLAP CLOSURE AND SENTINEL LYMPH NODE BIOPSY;  Surgeon: Almond Lint, MD;  Location: [facility] OR;  Service: General;  Laterality: Right;   WISDOM TOOTH EXTRACTION     Patient Active Problem List   Diagnosis Date Noted   HTN (hypertension) 03/15/2014   Chest pain     PCP: Guerry Bruin, MD  REFERRING PROVIDER: Pixie Casino, MD  REFERRING DIAG:  Diagnosis L59.8 (ICD-10-CM) - Other specified disorders of the skin and subcutaneous tissue related to radiation Y84.2  (ICD-10-CM) - Radiological procedure and radiotherapy as the cause of abnormal reaction of the patient, or of later complication, without mention of misadventure at the time of the procedure  THERAPY DIAG:  Disorder of the skin and subcutaneous tissue related to radiation, unspecified  Lymphedema, not elsewhere classified  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    Eval: I am in remission from melanoma. I had 3 radiation treatments with nano particles in my R thigh. The particles were supposed to decrease the number of radiation treatments I would need. It was supposed to make Keytruda more effective. Last year in the fall the place where I had radiation was hard and sore to touch. Sometimes it burns and stings. I discussed with [facility] about wether or not red light therapy or something would work. I had my PT work on massaging it. I see a PT for my back and my knees and my hip. The immuno therapy attacked my joints so it destroyed cartilage in my hip. I had to have a R hip replacement. It caused increased arthritis in knees and back. I also go to a massage therapist who has done some massaging to this place to loosen it up. My oncologist told me in May that other people in the trial were having the same problem as me and he prescribe pentoxifylline to help with the fibrosis and after 5 days I stopped it. I had back surgery 06/10/23. Tried medicine again 08/03/23 - week of and I felt achy again.   PERTINENT HISTORY: 07/2019 - R calf melanoma excision with SLNB inguinal 0/2, 08/2020 R calf melanoma excision, 11/2020 R calf melanoma saw Dr.  Azzie Almas Mochos in 12/2020 - had PET scan which showed melanoma in the calf did T-VEC injections every 2 weeks from 12/2020-04/2021, found a nodule in R inguinal area and inner thigh, went on immunotherapy 06/2021,  just had a biopsy to remove node in groin 1/1, was on immunotherapy for 6 weeks and the place in the medial thigh was growing and another node was swollen in the groin, 07/2021 had genetic testing, 08/12/21 did radiation to inner R thigh which is when the nano particles were used as part of study to reduce the number of radiation treatments needed and improve effectiveness of immunotherapy, had 3 radiation treatments, did immunotherapy until 05/2022 when she had to have a R hip replacement in 08/2022 and now is monitored with PET scans  PAIN:  Are you having pain? Yes currently not burning and stinging NPRS scale: 2/10 Pain location: R inner thigh Pain orientation: Right and Upper  PAIN TYPE: tingling, burning Pain description:  tingling, burning   Aggravating factors: nothing Relieving factors: nothing  PRECAUTIONS: Other: R THA 08/2022, micro discetomy 04/2023  RED FLAGS: None   WEIGHT BEARING RESTRICTIONS: No  FALLS:  Has patient fallen in last 6 months? No  LIVING ENVIRONMENT: Lives with: lives with their spouse Lives in: House/apartment Stairs: Yes; does not need to use them frequently in the house Has following equipment at home: Grab bars  OCCUPATION: retired  LEISURE: stretching and general strengthening 4-5x/wk for 15-20 min, walks  PRIOR LEVEL OF FUNCTION: Independent  PATIENT GOALS: to see if we can break down the fibrosis I have on my inner thigh and see if it can look any better   OBJECTIVE:  COGNITION: Overall cognitive status: Within functional limits for tasks assessed   PALPATION: Tightness and fibrosis palpable at medial thigh  OBSERVATIONS / OTHER ASSESSMENTS: indented skin at medial thigh, redness that pt reports has been there since  radiation   LYMPHEDEMA ASSESSMENTS:   SURGERY TYPE/DATE: 2 wide excision biopsies of R calf in 2020 and SLNB (0/2), lymph node dissection (1/1) 2022  NUMBER OF LYMPH NODES REMOVED: 2 in 2020 both negative,  1 in 2022 that was positive  RADIATION:completed in 2022   LYMPHEDEMA ASSESSMENTS: will assess at next session, unable today due to time constraints  LOWER EXTREMITY LANDMARK RIGHT eval  At groin   30 cm proximal to suprapatella   20 cm proximal to suprapatella 54.5  10 cm proximal to suprapatella 46.3  At midpatella / popliteal crease 42.5  30 cm proximal to floor at lateral plantar foot 39.4  20 cm proximal to floor at lateral plantar foot 30  10 cm proximal to floor at lateral plantar foot 21.5  Circumference of ankle/heel   5 cm proximal to 1st MTP joint 20.1  Across MTP joint 22.9  Around proximal great toe 7.5  (Blank rows = not tested)  LOWER EXTREMITY LANDMARK LEFT eval  At groin   30 cm proximal to suprapatella   20 cm proximal to suprapatella 55.1  10 cm proximal to suprapatella 47.5  At midpatella / popliteal crease 41.5  30 cm proximal to floor at lateral plantar foot 39  20 cm proximal to floor at lateral plantar foot 28.6  10 cm proximal to floor at lateral plantar foot 22  Circumference of ankle/heel   5 cm proximal to 1st MTP joint 20.1  Across MTP joint 22.6  Around proximal great toe 7  (Blank rows = not tested)   TODAY'S TREATMENT:                                                                                                                                          DATE:  09/08/23: Manual lymph drainage in supine: short neck, right axillary nodes, 5 diaphragmatic breaths, right inguino-axillary anastamoses, L inginal nodes and anterior inguinal anastomosis, R inguinal nodes, right lower extremity from knee to hip moving from inner thigh to outer thigh then retracing all steps During MLD performed suction cupping to area of fibrosis at medial  thigh using cocoa butter and suction cup moving cup in all directions while gently pulling upwards on cup   08/18/23: none today -eval only due to time constraints   PATIENT EDUCATION:  Education details: MLD and how this helps fibrosis, cupping to help improve skin mobility Person educated: Patient Education method: Explanation Education comprehension: verbalized understanding  HOME EXERCISE PROGRAM: None yet  ASSESSMENT:  CLINICAL IMPRESSION: Began MLD to RLE today and began using suction cup to area of scarring and fibrosis at medial thigh. Pt and therapist could palpate improved mobility at end of session. Educated pt to make notes of her burning and tingling to see if this decreases.   OBJECTIVE IMPAIRMENTS: difficulty walking, increased edema, increased fascial restrictions, impaired sensation, and pain.   ACTIVITY LIMITATIONS: sitting and sleeping  PARTICIPATION LIMITATIONS:  none  PERSONAL FACTORS: Time since onset of injury/illness/exacerbation are also affecting patient's functional outcome.   REHAB POTENTIAL: Good  CLINICAL DECISION MAKING: Stable/uncomplicated  EVALUATION COMPLEXITY: Low  GOALS: Goals reviewed with patient? Yes  SHORT TERM GOALS=LONG TERM GOALS Target date: 09/22/23  Pt will be independent in MLD for long term management of fibrosis and edema.  Baseline: Goal status: INITIAL  2.  Pt will report a 75% improvement in pain and discomfort in medial R thigh to allow her to cross her legs without pain. Baseline:  Goal status: INITIAL  3.  Pt will obtain a compression garment to decrease risk of lymphedema and help decrease discomfort.  Baseline:  Goal status: INITIAL  4.  Pt will have a 50% improvement in fibrosis in R medial thigh to allow improved comfort.  Baseline:  Goal status: INITIAL   PLAN:  PT FREQUENCY: 2x/week  PT DURATION: other: 5 weeks  PLANNED INTERVENTIONS: Therapeutic exercises, Therapeutic activity, Patient/Family  education, Self Care, Orthotic/Fit training, Manual lymph drainage, Compression bandaging, scar mobilization, Taping, Vasopneumatic device, and Manual therapy  PLAN FOR NEXT SESSION: continue MLD to R LE, suction cupping to R thigh, MFR   Tnia Anglada [employer] Blue, PT 09/08/2023, 3:56 PM

## 2023-09-10 ENCOUNTER — Ambulatory Visit: Payer: PPO | Admitting: Physical Therapy

## 2023-09-10 ENCOUNTER — Encounter: Payer: Self-pay | Admitting: Physical Therapy

## 2023-09-10 DIAGNOSIS — L599 Disorder of the skin and subcutaneous tissue related to radiation, unspecified: Secondary | ICD-10-CM | POA: Diagnosis not present

## 2023-09-10 DIAGNOSIS — L598 Other specified disorders of the skin and subcutaneous tissue related to radiation: Secondary | ICD-10-CM

## 2023-09-10 DIAGNOSIS — I890 Lymphedema, not elsewhere classified: Secondary | ICD-10-CM

## 2023-09-10 NOTE — Therapy (Signed)
OUTPATIENT PHYSICAL THERAPY  LOWER EXTREMITY ONCOLOGY TREATMENT  Patient Name: Claire Wall MRN: [id number] DOB:[birth year], 72 y.o., female Today's Date: 09/11/2023  END OF SESSION:  PT End of Session - 09/10/23 1446     Visit Number 3    Number of Visits 11    Date for PT Re-Evaluation 09/22/23    PT Start Time 1445    PT Stop Time 1533    PT Time Calculation (min) 48 min             Past Medical History:  Diagnosis Date   Arthritis    knees, shoulder   Cancer (HCC) 08/2019   skin - melanoma right calf   Chest pain 2015   Resolved per patient - anxiety/stress related to work, now retired, no current problem    Hyperlipidemia    Hypertension    resolved per patient, no problems since patient retired, had a stressful job.   Pneumonia    x 1   Past Surgical History:  Procedure Laterality Date   APPENDECTOMY     bladder tack  2018   at [facility]   BREAST BIOPSY Left    COLONOSCOPY     polyps   ELBOW SURGERY Bilateral    x 2   FOOT SURGERY Left    bunion   MELANOMA EXCISION Right 09/18/2020   Procedure: WIDE LOCAL EXICISION WITH ADVANCEMENT FLAP CLOSURE RIGHT CALF MELANOMA;  Surgeon: Almond Lint, MD;  Location: [facility] OR;  Service: General;  Laterality: Right;   MELANOMA EXCISION WITH SENTINEL LYMPH NODE BIOPSY Right 10/13/2019   Procedure: WIDE LOCAL EXCISION WITH ADVANCEMENT FLAP CLOSURE AND SENTINEL LYMPH NODE BIOPSY;  Surgeon: Almond Lint, MD;  Location: [facility] OR;  Service: General;  Laterality: Right;   WISDOM TOOTH EXTRACTION     Patient Active Problem List   Diagnosis Date Noted   HTN (hypertension) 03/15/2014   Chest pain     PCP: Guerry Bruin, MD  REFERRING PROVIDER: Pixie Casino, MD  REFERRING DIAG:  Diagnosis L59.8 (ICD-10-CM) - Other specified disorders of the skin and subcutaneous tissue related to radiation Y84.2 (ICD-10-CM) - Radiological procedure and radiotherapy as the cause of abnormal reaction of the patient, or of later  complication, without mention of misadventure at the time of the procedure  THERAPY DIAG:  Disorder of the skin and subcutaneous tissue related to radiation, unspecified  Lymphedema, not elsewhere classified  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 I ached. My stomach was a little upset. I had a headache. I have noticed a lot of tingling.    Eval: I am in remission from melanoma. I had 3 radiation treatments with nano particles in my R thigh. The particles were supposed to decrease the number of radiation treatments I would need. It was supposed to make Keytruda more effective. Last year in the fall the place where I had radiation was hard and sore to touch. Sometimes it burns and stings. I discussed with [facility] about wether or not red light therapy or something would work. I had my PT work on massaging it. I see a PT for my back and my knees and my hip. The immuno therapy attacked my joints so it destroyed cartilage in my hip. I had to have a R hip replacement. It caused increased arthritis in knees and back. I also go to a massage therapist who has done some massaging to this place to loosen it up. My oncologist told me in May that other people in the trial were having the same problem as me and he prescribe pentoxifylline to help with the fibrosis and after 5 days I stopped it. I had back surgery 06/10/23. Tried medicine again 08/03/23 - week of and I felt achy again.   PERTINENT HISTORY: 07/2019 - R calf melanoma excision with SLNB inguinal 0/2, 08/2020 R calf melanoma excision, 11/2020 R calf melanoma saw Dr. Azzie Almas Mochos in  12/2020 - had PET scan which showed melanoma in the calf did T-VEC injections every 2 weeks from 12/2020-04/2021, found a nodule in R inguinal area and inner thigh, went on immunotherapy 06/2021,  just had a biopsy to remove node in groin 1/1, was on immunotherapy for 6 weeks and the place in the medial thigh was growing and another node was swollen in the groin, 07/2021 had genetic testing, 08/12/21 did radiation to inner R thigh which is when the nano particles were used as part of study to reduce the number of radiation treatments needed and improve effectiveness of immunotherapy, had 3 radiation treatments, did immunotherapy until 05/2022 when she had to have a R hip replacement in 08/2022 and now is monitored with PET scans  PAIN:  Are you having pain? No   PRECAUTIONS: Other: R THA 08/2022, micro discetomy 04/2023  RED FLAGS: None   WEIGHT BEARING RESTRICTIONS: No  FALLS:  Has patient fallen in last 6 months? No  LIVING ENVIRONMENT: Lives with: lives with their spouse Lives in: House/apartment Stairs: Yes; does not need to use them frequently in the house Has following equipment at home: Grab bars  OCCUPATION: retired  LEISURE: stretching and general strengthening 4-5x/wk for 15-20 min, walks  PRIOR LEVEL OF FUNCTION: Independent  PATIENT GOALS: to see if we can break down the fibrosis I have on my inner thigh and see if it can look any better   OBJECTIVE:  COGNITION: Overall cognitive status: Within functional limits for tasks assessed   PALPATION: Tightness and fibrosis palpable at medial thigh  OBSERVATIONS / OTHER ASSESSMENTS: indented skin at medial thigh, redness that pt reports has been there since radiation   LYMPHEDEMA ASSESSMENTS:   SURGERY TYPE/DATE: 2 wide excision biopsies of R calf in 2020 and SLNB (0/2), lymph node dissection (1/1) 2022  NUMBER OF LYMPH NODES REMOVED: 2 in 2020 both negative, 1 in 2022 that was positive  RADIATION:completed in  2022   LYMPHEDEMA ASSESSMENTS:  will assess at next session, unable today due to time constraints  LOWER EXTREMITY LANDMARK RIGHT eval  At groin   30 cm proximal to suprapatella   20 cm proximal to suprapatella 54.5  10 cm proximal to suprapatella 46.3  At midpatella / popliteal crease 42.5  30 cm proximal to floor at lateral plantar foot 39.4  20 cm proximal to floor at lateral plantar foot 30  10 cm proximal to floor at lateral plantar foot 21.5  Circumference of ankle/heel   5 cm proximal to 1st MTP joint 20.1  Across MTP joint 22.9  Around proximal great toe 7.5  (Blank rows = not tested)  LOWER EXTREMITY LANDMARK LEFT eval  At groin   30 cm proximal to suprapatella   20 cm proximal to suprapatella 55.1  10 cm proximal to suprapatella 47.5  At midpatella / popliteal crease 41.5  30 cm proximal to floor at lateral plantar foot 39  20 cm proximal to floor at lateral plantar foot 28.6  10 cm proximal to floor at lateral plantar foot 22  Circumference of ankle/heel   5 cm proximal to 1st MTP joint 20.1  Across MTP joint 22.6  Around proximal great toe 7  (Blank rows = not tested)   TODAY'S TREATMENT:                                                                                                                                          DATE:  09/10/23: Manual lymph drainage in supine: short neck, right axillary nodes, 5 diaphragmatic breaths, right inguino-axillary anastamoses, L inginal nodes and anterior inguinal anastomosis, R inguinal nodes, right lower extremity from knee to hip moving from inner thigh to outer thigh then retracing all steps. Educated pt throughout and educated pt in proper technique including skin stretch and sequence and issued handout to patient to practice at home. Had pt demonstrate correct skin stretch technique.  Gentle MFR to area of scar on inner R thigh in all directions to help improve mobility During MLD performed suction cupping to area of  fibrosis at medial thigh using cocoa butter and suction cup moving cup in all directions while gently pulling upwards on cup  09/08/23: Manual lymph drainage in supine: short neck, right axillary nodes, 5 diaphragmatic breaths, right inguino-axillary anastamoses, L inginal nodes and anterior inguinal anastomosis, R inguinal nodes, right lower extremity from knee to hip moving from inner thigh to outer thigh then retracing all steps During MLD performed suction cupping to area of fibrosis at medial thigh using cocoa butter and suction cup moving cup in all directions while gently pulling upwards on cup   08/18/23: none today -eval only due to time constraints   PATIENT EDUCATION:  Education details: MLD and how this helps fibrosis, cupping to help improve skin mobility Person educated: Patient Education method: Explanation Education comprehension: verbalized understanding  HOME EXERCISE PROGRAM:  Self MLD for R LE with focus on inner R thigh  ASSESSMENT:  CLINICAL IMPRESSION: Continued with MLD to R thigh and suction cupping to improve skin mobility. Area had improved skin mobility by end of session. Also added MFR today to help decrease tightness and allow improved comfort. Issued handout for self MLD to patient and had her return demonstrate proper skin stretch technique.   OBJECTIVE IMPAIRMENTS: difficulty walking, increased edema, increased fascial restrictions, impaired sensation, and pain.   ACTIVITY LIMITATIONS: sitting and sleeping  PARTICIPATION LIMITATIONS:  none  PERSONAL FACTORS: Time since onset of injury/illness/exacerbation are also affecting patient's functional outcome.   REHAB POTENTIAL: Good  CLINICAL DECISION MAKING: Stable/uncomplicated  EVALUATION COMPLEXITY: Low   GOALS: Goals reviewed with patient? Yes  SHORT TERM GOALS=LONG TERM GOALS Target date: 09/22/23  Pt will be independent in MLD for long term management of fibrosis and edema.  Baseline: Goal  status: INITIAL  2.  Pt will report a 75% improvement in pain and discomfort in medial R thigh to allow her to cross her legs without pain. Baseline:  Goal status: INITIAL  3.  Pt will obtain a compression garment to decrease risk of lymphedema and help decrease discomfort.  Baseline:  Goal status: INITIAL  4.  Pt will have a 50% improvement in fibrosis in R medial thigh to allow improved comfort.  Baseline:  Goal status: INITIAL   PLAN:  PT FREQUENCY: 2x/week  PT DURATION: other: 5 weeks  PLANNED INTERVENTIONS: Therapeutic exercises, Therapeutic activity, Patient/Family education, Self Care, Orthotic/Fit training, Manual lymph drainage, Compression bandaging, scar mobilization, Taping, Vasopneumatic device, and Manual therapy  PLAN FOR NEXT SESSION: continue MLD to R LE, suction cupping to R thigh, MFR   Damond Borchers Breedlove Blue, PT 09/11/2023, 4:19 PM

## 2023-09-10 NOTE — Patient Instructions (Signed)
Deep Effective Breath   Standing, sitting, or laying down place both hands on the belly. Take a deep breath IN, expanding the belly; then breath OUT, contracting the belly. Repeat __5__ times. Do __2-3__ sessions per day and before each self massage.  http://gt2.exer.us/866   Copyright  VHI. All rights reserved.  Inguinal Nodes to Axilla - Clear   On involved side, at armpit, make _5__ in-place circles. Then from hip proceed in sections to armpit with stationary circles or pumps _5_ times, this is your pathway. Do _1__ time per day.  Copyright  VHI. All rights reserved.  LEG: Knee to Hip - Clear   Pump up outer thigh of involved leg from knee to outer hip. Then do stationary circles from inner to outer thigh, then do outer thigh again.   Then retrace all steps pumping back up, outer thigh, and then pathway. Finish with what you started with, _5_ circles at involved side arm pit. All _2-3_ times at each sequence. Do _1__ time per day.  Copyright  VHI. All rights reserved.

## 2023-09-11 DIAGNOSIS — Z471 Aftercare following joint replacement surgery: Secondary | ICD-10-CM | POA: Diagnosis not present

## 2023-09-11 DIAGNOSIS — M25651 Stiffness of right hip, not elsewhere classified: Secondary | ICD-10-CM | POA: Diagnosis not present

## 2023-09-11 DIAGNOSIS — R262 Difficulty in walking, not elsewhere classified: Secondary | ICD-10-CM | POA: Diagnosis not present

## 2023-09-15 ENCOUNTER — Encounter: Payer: Self-pay | Admitting: Physical Therapy

## 2023-09-15 ENCOUNTER — Ambulatory Visit: Payer: PPO | Admitting: Physical Therapy

## 2023-09-15 DIAGNOSIS — Z471 Aftercare following joint replacement surgery: Secondary | ICD-10-CM | POA: Diagnosis not present

## 2023-09-15 DIAGNOSIS — L599 Disorder of the skin and subcutaneous tissue related to radiation, unspecified: Secondary | ICD-10-CM

## 2023-09-15 DIAGNOSIS — R262 Difficulty in walking, not elsewhere classified: Secondary | ICD-10-CM | POA: Diagnosis not present

## 2023-09-15 DIAGNOSIS — I890 Lymphedema, not elsewhere classified: Secondary | ICD-10-CM

## 2023-09-15 DIAGNOSIS — L598 Other specified disorders of the skin and subcutaneous tissue related to radiation: Secondary | ICD-10-CM

## 2023-09-15 DIAGNOSIS — M25651 Stiffness of right hip, not elsewhere classified: Secondary | ICD-10-CM | POA: Diagnosis not present

## 2023-09-15 NOTE — Therapy (Signed)
OUTPATIENT PHYSICAL THERAPY  LOWER EXTREMITY ONCOLOGY TREATMENT  Patient Name: Claire Wall MRN: [id number] DOB:15-Sep-1951, 72 y.o., female Today's Date: 09/15/2023  END OF SESSION:  PT End of Session - 09/15/23 1402     Visit Number 4    Number of Visits 11    Date for PT Re-Evaluation 09/22/23    PT Start Time 1402    PT Stop Time 1449    PT Time Calculation (min) 47 min    Activity Tolerance Patient tolerated treatment well    Behavior During Therapy [facility] for tasks assessed/performed             Past Medical History:  Diagnosis Date   Arthritis    knees, shoulder   Cancer (HCC) 08/2019   skin - melanoma right calf   Chest pain 2015   Resolved per patient - anxiety/stress related to work, now retired, no current problem    Hyperlipidemia    Hypertension    resolved per patient, no problems since patient retired, had a stressful job.   Pneumonia    x 1   Past Surgical History:  Procedure Laterality Date   APPENDECTOMY     bladder tack  2018   at [facility]   BREAST BIOPSY Left    COLONOSCOPY     polyps   ELBOW SURGERY Bilateral    x 2   FOOT SURGERY Left    bunion   MELANOMA EXCISION Right 09/18/2020   Procedure: WIDE LOCAL EXICISION WITH ADVANCEMENT FLAP CLOSURE RIGHT CALF MELANOMA;  Surgeon: Almond Lint, MD;  Location: [facility] OR;  Service: General;  Laterality: Right;   MELANOMA EXCISION WITH SENTINEL LYMPH NODE BIOPSY Right 10/13/2019   Procedure: WIDE LOCAL EXCISION WITH ADVANCEMENT FLAP CLOSURE AND SENTINEL LYMPH NODE BIOPSY;  Surgeon: Almond Lint, MD;  Location: [facility] OR;  Service: General;  Laterality: Right;   WISDOM TOOTH EXTRACTION     Patient Active Problem List   Diagnosis Date Noted   HTN (hypertension) 03/15/2014   Chest pain     PCP: Guerry Bruin, MD  REFERRING PROVIDER: Pixie Casino, MD  REFERRING DIAG:  Diagnosis L59.8 (ICD-10-CM) - Other specified disorders of the skin and subcutaneous tissue related to radiation Y84.2  (ICD-10-CM) - Radiological procedure and radiotherapy as the cause of abnormal reaction of the patient, or of later complication, without mention of misadventure at the time of the procedure  THERAPY DIAG:  Disorder of the skin and subcutaneous tissue related to radiation, unspecified  Lymphedema, not elsewhere classified  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 I had to go out of town unexpectedly so I was not able to try and cupping but I did do some massage.    Eval: I am in remission from melanoma. I had 3 radiation treatments with nano particles in my R thigh. The particles were supposed to decrease the number of radiation treatments I would need. It was supposed to make Keytruda more effective. Last year in the fall the place where I had radiation was hard and sore to touch. Sometimes it burns and stings. I discussed with [facility] about wether or not red light therapy or something would work. I had my PT work on massaging it. I see a PT for my back and my knees and my hip. The immuno therapy attacked my joints so it destroyed cartilage in my hip. I had to have a R hip replacement. It caused increased arthritis in knees and back. I also go to a massage therapist who has done some massaging to this place to loosen it up. My oncologist told me in May that other people in the trial were having the same problem as me and he prescribe pentoxifylline to help with the fibrosis and after 5 days I stopped it. I had back surgery 06/10/23. Tried medicine again 08/03/23 - week of and I felt achy again.    PERTINENT HISTORY: 07/2019 - R calf melanoma excision with SLNB inguinal 0/2, 08/2020 R calf melanoma excision, 11/2020 R calf melanoma saw Dr. Azzie Almas Mochos in 12/2020 - had PET scan which showed melanoma in the calf did T-VEC injections every 2 weeks from 12/2020-04/2021, found a nodule in R inguinal area and inner thigh, went on immunotherapy 06/2021,  just had a biopsy to remove node in groin 1/1, was on immunotherapy for 6 weeks and the place in the medial thigh was growing and another node was swollen in the groin, 07/2021 had genetic testing, 08/12/21 did radiation to inner R thigh which is when the nano particles were used as part of study to reduce the number of radiation treatments needed and improve effectiveness of immunotherapy, had 3 radiation treatments, did immunotherapy until 05/2022 when she had to have a R hip replacement in 08/2022 and now is monitored with PET scans  PAIN:  Are you having pain? No   PRECAUTIONS: Other: R THA 08/2022, micro discetomy 04/2023  RED FLAGS: None   WEIGHT BEARING RESTRICTIONS: No  FALLS:  Has patient fallen in last 6 months? No  LIVING ENVIRONMENT: Lives with: lives with their spouse Lives in: House/apartment Stairs: Yes; does not need to use them frequently in the house Has following equipment at home: Grab bars  OCCUPATION: retired  LEISURE: stretching and general strengthening 4-5x/wk for 15-20 min, walks  PRIOR LEVEL OF FUNCTION: Independent  PATIENT GOALS: to see if we can break down the fibrosis I have on my inner thigh and see if it can look any better   OBJECTIVE:  COGNITION: Overall cognitive status: Within functional limits for tasks assessed   PALPATION: Tightness and fibrosis palpable at medial thigh  OBSERVATIONS / OTHER ASSESSMENTS: indented skin at medial thigh, redness that pt reports has been there since radiation   LYMPHEDEMA ASSESSMENTS:   SURGERY TYPE/DATE: 2 wide excision biopsies of R calf in 2020 and SLNB  (0/2), lymph node dissection (1/1) 2022  NUMBER OF LYMPH NODES REMOVED: 2 in 2020 both negative, 1 in 2022  that was positive  RADIATION:completed in 2022   LYMPHEDEMA ASSESSMENTS: will assess at next session, unable today due to time constraints  LOWER EXTREMITY LANDMARK RIGHT eval  At groin   30 cm proximal to suprapatella   20 cm proximal to suprapatella 54.5  10 cm proximal to suprapatella 46.3  At midpatella / popliteal crease 42.5  30 cm proximal to floor at lateral plantar foot 39.4  20 cm proximal to floor at lateral plantar foot 30  10 cm proximal to floor at lateral plantar foot 21.5  Circumference of ankle/heel   5 cm proximal to 1st MTP joint 20.1  Across MTP joint 22.9  Around proximal great toe 7.5  (Blank rows = not tested)  LOWER EXTREMITY LANDMARK LEFT eval  At groin   30 cm proximal to suprapatella   20 cm proximal to suprapatella 55.1  10 cm proximal to suprapatella 47.5  At midpatella / popliteal crease 41.5  30 cm proximal to floor at lateral plantar foot 39  20 cm proximal to floor at lateral plantar foot 28.6  10 cm proximal to floor at lateral plantar foot 22  Circumference of ankle/heel   5 cm proximal to 1st MTP joint 20.1  Across MTP joint 22.6  Around proximal great toe 7  (Blank rows = not tested)   TODAY'S TREATMENT:                                                                                                                                          DATE:  09/15/23: Gentle MFR to area of scar on inner R thigh in all directions to help improve mobility Performed suction cupping to area of fibrosis at medial thigh using cocoa butter and suction cup moving cup in all directions while gently pulling upwards on cup Abbreviated MLD moving fluid from inner thigh to outer thigh while in sidelying then back to supine to move fluid up towards R axilla Kinesiotaping as follows: lattice work pattern with approx 1/4 in wide strips - pt reports she has  used kinesiotape in the past with no issues or skin reactions  09/10/23: Manual lymph drainage in supine: short neck, right axillary nodes, 5 diaphragmatic breaths, right inguino-axillary anastamoses, L inginal nodes and anterior inguinal anastomosis, R inguinal nodes, right lower extremity from knee to hip moving from inner thigh to outer thigh then retracing all steps. Educated pt throughout and educated pt in proper technique including skin stretch and sequence and issued handout to patient to practice at home. Had pt demonstrate correct skin stretch technique.  Gentle MFR to area of scar on inner R thigh in all directions to help improve mobility During MLD performed suction cupping to area of fibrosis at medial thigh using cocoa butter and suction cup moving cup in all directions while gently pulling upwards on cup  09/08/23: Manual lymph drainage in supine: short neck, right  axillary nodes, 5 diaphragmatic breaths, right inguino-axillary anastamoses, L inginal nodes and anterior inguinal anastomosis, R inguinal nodes, right lower extremity from knee to hip moving from inner thigh to outer thigh then retracing all steps During MLD performed suction cupping to area of fibrosis at medial thigh using cocoa butter and suction cup moving cup in all directions while gently pulling upwards on cup   08/18/23: none today -eval only due to time constraints   PATIENT EDUCATION:  Education details: MLD and how this helps fibrosis, cupping to help improve skin mobility Person educated: Patient Education method: Explanation Education comprehension: verbalized understanding  HOME EXERCISE PROGRAM: Self MLD for R LE with focus on inner R thigh  ASSESSMENT:  CLINICAL IMPRESSION: Focused today on suction cupping to improve mobility in area of fibrosis followed by MFR and MLD. Tried kinesiotape today for the scar to see if this also improves mobility. Overall she has had decreased episodes of tingling and  burning since beginning therapy.   OBJECTIVE IMPAIRMENTS: difficulty walking, increased edema, increased fascial restrictions, impaired sensation, and pain.   ACTIVITY LIMITATIONS: sitting and sleeping  PARTICIPATION LIMITATIONS:  none  PERSONAL FACTORS: Time since onset of injury/illness/exacerbation are also affecting patient's functional outcome.   REHAB POTENTIAL: Good  CLINICAL DECISION MAKING: Stable/uncomplicated  EVALUATION COMPLEXITY: Low   GOALS: Goals reviewed with patient? Yes  SHORT TERM GOALS=LONG TERM GOALS Target date: 09/22/23  Pt will be independent in MLD for long term management of fibrosis and edema.  Baseline: Goal status: INITIAL  2.  Pt will report a 75% improvement in pain and discomfort in medial R thigh to allow her to cross her legs without pain. Baseline:  Goal status: INITIAL  3.  Pt will obtain a compression garment to decrease risk of lymphedema and help decrease discomfort.  Baseline:  Goal status: INITIAL  4.  Pt will have a 50% improvement in fibrosis in R medial thigh to allow improved comfort.  Baseline:  Goal status: INITIAL   PLAN:  PT FREQUENCY: 2x/week  PT DURATION: other: 5 weeks  PLANNED INTERVENTIONS: Therapeutic exercises, Therapeutic activity, Patient/Family education, Self Care, Orthotic/Fit training, Manual lymph drainage, Compression bandaging, scar mobilization, Taping, Vasopneumatic device, and Manual therapy  PLAN FOR NEXT SESSION: continue MLD to R LE, suction cupping to R thigh, MFR   Javarian Jakubiak Breedlove Blue, PT 09/15/2023, 2:55 PM

## 2023-09-17 ENCOUNTER — Ambulatory Visit: Payer: PPO | Admitting: Physical Therapy

## 2023-09-17 ENCOUNTER — Encounter: Payer: Self-pay | Admitting: Physical Therapy

## 2023-09-17 DIAGNOSIS — L599 Disorder of the skin and subcutaneous tissue related to radiation, unspecified: Secondary | ICD-10-CM

## 2023-09-17 DIAGNOSIS — I890 Lymphedema, not elsewhere classified: Secondary | ICD-10-CM

## 2023-09-17 DIAGNOSIS — Y842 Radiological procedure and radiotherapy as the cause of abnormal reaction of the patient, or of later complication, without mention of misadventure at the time of the procedure: Secondary | ICD-10-CM

## 2023-09-17 NOTE — Therapy (Signed)
OUTPATIENT PHYSICAL THERAPY  LOWER EXTREMITY ONCOLOGY TREATMENT  Patient Name: Claire Wall MRN: [id number] DOB:12/01/1951, 72 y.o., female Today's Date: 09/17/2023  END OF SESSION:  PT End of Session - 09/17/23 1503     Visit Number 5    Number of Visits 11    Date for PT Re-Evaluation 09/22/23    PT Start Time 1502    PT Stop Time 1557    PT Time Calculation (min) 55 min    Activity Tolerance Patient tolerated treatment well    Behavior During Therapy [facility] for tasks assessed/performed             Past Medical History:  Diagnosis Date   Arthritis    knees, shoulder   Cancer (HCC) 08/2019   skin - melanoma right calf   Chest pain 2015   Resolved per patient - anxiety/stress related to work, now retired, no current problem    Hyperlipidemia    Hypertension    resolved per patient, no problems since patient retired, had a stressful job.   Pneumonia    x 1   Past Surgical History:  Procedure Laterality Date   APPENDECTOMY     bladder tack  2018   at [facility]   BREAST BIOPSY Left    COLONOSCOPY     polyps   ELBOW SURGERY Bilateral    x 2   FOOT SURGERY Left    bunion   MELANOMA EXCISION Right 09/18/2020   Procedure: WIDE LOCAL EXICISION WITH ADVANCEMENT FLAP CLOSURE RIGHT CALF MELANOMA;  Surgeon: Almond Lint, MD;  Location: [facility] OR;  Service: General;  Laterality: Right;   MELANOMA EXCISION WITH SENTINEL LYMPH NODE BIOPSY Right 10/13/2019   Procedure: WIDE LOCAL EXCISION WITH ADVANCEMENT FLAP CLOSURE AND SENTINEL LYMPH NODE BIOPSY;  Surgeon: Almond Lint, MD;  Location: [facility] OR;  Service: General;  Laterality: Right;   WISDOM TOOTH EXTRACTION     Patient Active Problem List   Diagnosis Date Noted   HTN (hypertension) 03/15/2014   Chest pain     PCP: Guerry Bruin, MD  REFERRING PROVIDER: Pixie Casino, MD  REFERRING DIAG:  Diagnosis L59.8 (ICD-10-CM) - Other specified disorders of the skin and subcutaneous tissue related to radiation Y84.2  (ICD-10-CM) - Radiological procedure and radiotherapy as the cause of abnormal reaction of the patient, or of later complication, without mention of misadventure at the time of the procedure  THERAPY DIAG:  Disorder of the skin and subcutaneous tissue related to radiation, unspecified  Lymphedema, not elsewhere classified  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 The tape stayed on. I had some tingling.    Eval: I am in remission from melanoma. I had 3 radiation treatments with nano particles in my R thigh. The particles were supposed to decrease the number of radiation treatments I would need. It was supposed to make Keytruda more effective. Last year in the fall the place where I had radiation was hard and sore to touch. Sometimes it burns and stings. I discussed with [facility] about wether or not red light therapy or something would work. I had my PT work on massaging it. I see a PT for my back and my knees and my hip. The immuno therapy attacked my joints so it destroyed cartilage in my hip. I had to have a R hip replacement. It caused increased arthritis in knees and back. I also go to a massage therapist who has done some massaging to this place to loosen it up. My oncologist told me in May that other people in the trial were having the same problem as me and he prescribe pentoxifylline to help with the fibrosis and after 5 days I stopped it. I had back surgery 06/10/23. Tried medicine again 08/03/23 - week of and I felt achy again.   PERTINENT HISTORY: 07/2019 - R calf melanoma excision with SLNB inguinal 0/2, 08/2020 R calf melanoma excision,  11/2020 R calf melanoma saw Dr. Azzie Almas Mochos in 12/2020 - had PET scan which showed melanoma in the calf did T-VEC injections every 2 weeks from 12/2020-04/2021, found a nodule in R inguinal area and inner thigh, went on immunotherapy 06/2021,  just had a biopsy to remove node in groin 1/1, was on immunotherapy for 6 weeks and the place in the medial thigh was growing and another node was swollen in the groin, 07/2021 had genetic testing, 08/12/21 did radiation to inner R thigh which is when the nano particles were used as part of study to reduce the number of radiation treatments needed and improve effectiveness of immunotherapy, had 3 radiation treatments, did immunotherapy until 05/2022 when she had to have a R hip replacement in 08/2022 and now is monitored with PET scans  PAIN:  Are you having pain? No   PRECAUTIONS: Other: R THA 08/2022, micro discetomy 04/2023  RED FLAGS: None   WEIGHT BEARING RESTRICTIONS: No  FALLS:  Has patient fallen in last 6 months? No  LIVING ENVIRONMENT: Lives with: lives with their spouse Lives in: House/apartment Stairs: Yes; does not need to use them frequently in the house Has following equipment at home: Grab bars  OCCUPATION: retired  LEISURE: stretching and general strengthening 4-5x/wk for 15-20 min, walks  PRIOR LEVEL OF FUNCTION: Independent  PATIENT GOALS: to see if we can break down the fibrosis I have on my inner thigh and see if it can look any better   OBJECTIVE:  COGNITION: Overall cognitive status: Within functional limits for tasks assessed   PALPATION: Tightness and fibrosis palpable at medial thigh  OBSERVATIONS / OTHER ASSESSMENTS: indented skin at medial thigh, redness that pt reports has been there since radiation   LYMPHEDEMA ASSESSMENTS:   SURGERY TYPE/DATE: 2 wide excision biopsies of R calf in 2020 and SLNB (0/2), lymph node dissection (1/1) 2022  NUMBER OF LYMPH NODES REMOVED: 2 in 2020 both negative, 1 in 2022 that was  positive  RADIATION:completed in 2022   LYMPHEDEMA ASSESSMENTS: will assess at next session, unable today due to time constraints  LOWER EXTREMITY  LANDMARK RIGHT eval  At groin   30 cm proximal to suprapatella   20 cm proximal to suprapatella 54.5  10 cm proximal to suprapatella 46.3  At midpatella / popliteal crease 42.5  30 cm proximal to floor at lateral plantar foot 39.4  20 cm proximal to floor at lateral plantar foot 30  10 cm proximal to floor at lateral plantar foot 21.5  Circumference of ankle/heel   5 cm proximal to 1st MTP joint 20.1  Across MTP joint 22.9  Around proximal great toe 7.5  (Blank rows = not tested)  LOWER EXTREMITY LANDMARK LEFT eval  At groin   30 cm proximal to suprapatella   20 cm proximal to suprapatella 55.1  10 cm proximal to suprapatella 47.5  At midpatella / popliteal crease 41.5  30 cm proximal to floor at lateral plantar foot 39  20 cm proximal to floor at lateral plantar foot 28.6  10 cm proximal to floor at lateral plantar foot 22  Circumference of ankle/heel   5 cm proximal to 1st MTP joint 20.1  Across MTP joint 22.6  Around proximal great toe 7  (Blank rows = not tested)   TODAY'S TREATMENT:                                                                                                                                          DATE:  09/17/23: Removed kinesiotape and assessed skin Manual lymph drainage in supine: short neck, 5 diaphragmatic breaths, right axillary nodes,  right inguino-axillary anastamoses, right lower extremity from groin moving to knee then retracing steps. Gentle MFR to area of scar on inner R thigh in all directions to help improve mobility Performed suction cupping to area of fibrosis at medial thigh using cocoa butter and suction cup moving cup in all directions while gently pulling upwards on cup with improved ability to move cup today Measured pt for thigh high stockings and educated pt to either order  from [place] in order to get them here in time for her trip next Wedneday or go to [facility]: Medi Assure 20-4mmHg, Med Avg length or Juzo basic II avg length 20-40mmHg Created chip pack for pt to wear in leggings or bike shorts for additional compression Educated pt in availability of silicone scar tape to try  09/15/23: Gentle MFR to area of scar on inner R thigh in all directions to help improve mobility Performed suction cupping to area of fibrosis at medial thigh using cocoa butter and suction cup moving cup in all directions while gently pulling upwards on cup Abbreviated MLD moving fluid from inner thigh to outer thigh while in sidelying then back to supine to move fluid up towards R axilla Kinesiotaping as follows: lattice work pattern with approx 1/4 in wide strips - pt reports she has used kinesiotape in the past with no issues or skin reactions  09/10/23:  Manual lymph drainage in supine: short neck, right axillary nodes, 5 diaphragmatic breaths, right inguino-axillary anastamoses, L inginal nodes and anterior inguinal anastomosis, R inguinal nodes, right lower extremity from knee to hip moving from inner thigh to outer thigh then retracing all steps. Educated pt throughout and educated pt in proper technique including skin stretch and sequence and issued handout to patient to practice at home. Had pt demonstrate correct skin stretch technique.  Gentle MFR to area of scar on inner R thigh in all directions to help improve mobility During MLD performed suction cupping to area of fibrosis at medial thigh using cocoa butter and suction cup moving cup in all directions while gently pulling upwards on cup  09/08/23: Manual lymph drainage in supine: short neck, right axillary nodes, 5 diaphragmatic breaths, right inguino-axillary anastamoses, L inginal nodes and anterior inguinal anastomosis, R inguinal nodes, right lower extremity from knee to hip moving from inner thigh to outer thigh then  retracing all steps During MLD performed suction cupping to area of fibrosis at medial thigh using cocoa butter and suction cup moving cup in all directions while gently pulling upwards on cup   08/18/23: none today -eval only due to time constraints   PATIENT EDUCATION:  Education details: MLD and how this helps fibrosis, cupping to help improve skin mobility Person educated: Patient Education method: Explanation Education comprehension: verbalized understanding  HOME EXERCISE PROGRAM: Self MLD for R LE with focus on inner R thigh  ASSESSMENT:  CLINICAL IMPRESSION: Pt is demonstrating great improvement in mobility of scar and area of fibrosis in R inner thigh. It was much easier to glide suction cup over skin and the area is palpably softer. Educated pt about need for thigh high compression stockings for upcoming tri in which pt will be flying. Educated pt on how to obtain compression stockings. Created foam chip pack for compression to be worn with leggings or bike shorts. Also educated pt in silicone scar tape to further help reduce fibrosis.   OBJECTIVE IMPAIRMENTS: difficulty walking, increased edema, increased fascial restrictions, impaired sensation, and pain.   ACTIVITY LIMITATIONS: sitting and sleeping  PARTICIPATION LIMITATIONS:  none  PERSONAL FACTORS: Time since onset of injury/illness/exacerbation are also affecting patient's functional outcome.   REHAB POTENTIAL: Good  CLINICAL DECISION MAKING: Stable/uncomplicated  EVALUATION COMPLEXITY: Low   GOALS: Goals reviewed with patient? Yes  SHORT TERM GOALS=LONG TERM GOALS Target date: 09/22/23  Pt will be independent in MLD for long term management of fibrosis and edema.  Baseline: Goal status: INITIAL  2.  Pt will report a 75% improvement in pain and discomfort in medial R thigh to allow her to cross her legs without pain. Baseline:  Goal status: INITIAL  3.  Pt will obtain a compression garment to decrease  risk of lymphedema and help decrease discomfort.  Baseline:  Goal status: INITIAL  4.  Pt will have a 50% improvement in fibrosis in R medial thigh to allow improved comfort.  Baseline:  Goal status: INITIAL   PLAN:  PT FREQUENCY: 2x/week  PT DURATION: other: 5 weeks  PLANNED INTERVENTIONS: Therapeutic exercises, Therapeutic activity, Patient/Family education, Self Care, Orthotic/Fit training, Manual lymph drainage, Compression bandaging, scar mobilization, Taping, Vasopneumatic device, and Manual therapy  PLAN FOR NEXT SESSION: continue MLD to R LE, suction cupping to R thigh, MFR   Aarion Kittrell Breedlove Blue, PT 09/17/2023, 4:06 PM

## 2023-09-18 DIAGNOSIS — M25651 Stiffness of right hip, not elsewhere classified: Secondary | ICD-10-CM | POA: Diagnosis not present

## 2023-09-18 DIAGNOSIS — Z471 Aftercare following joint replacement surgery: Secondary | ICD-10-CM | POA: Diagnosis not present

## 2023-09-18 DIAGNOSIS — R262 Difficulty in walking, not elsewhere classified: Secondary | ICD-10-CM | POA: Diagnosis not present

## 2023-09-21 DIAGNOSIS — R262 Difficulty in walking, not elsewhere classified: Secondary | ICD-10-CM | POA: Diagnosis not present

## 2023-09-21 DIAGNOSIS — M25651 Stiffness of right hip, not elsewhere classified: Secondary | ICD-10-CM | POA: Diagnosis not present

## 2023-09-21 DIAGNOSIS — Z471 Aftercare following joint replacement surgery: Secondary | ICD-10-CM | POA: Diagnosis not present

## 2023-09-22 ENCOUNTER — Encounter: Payer: Self-pay | Admitting: Physical Therapy

## 2023-09-22 ENCOUNTER — Ambulatory Visit: Payer: PPO | Attending: Physical Medicine & Rehabilitation | Admitting: Physical Therapy

## 2023-09-22 DIAGNOSIS — L599 Disorder of the skin and subcutaneous tissue related to radiation, unspecified: Secondary | ICD-10-CM | POA: Insufficient documentation

## 2023-09-22 DIAGNOSIS — L598 Other specified disorders of the skin and subcutaneous tissue related to radiation: Secondary | ICD-10-CM | POA: Diagnosis not present

## 2023-09-22 DIAGNOSIS — Y842 Radiological procedure and radiotherapy as the cause of abnormal reaction of the patient, or of later complication, without mention of misadventure at the time of the procedure: Secondary | ICD-10-CM | POA: Diagnosis not present

## 2023-09-22 DIAGNOSIS — I890 Lymphedema, not elsewhere classified: Secondary | ICD-10-CM | POA: Insufficient documentation

## 2023-09-22 NOTE — Therapy (Signed)
OUTPATIENT PHYSICAL THERAPY  LOWER EXTREMITY ONCOLOGY TREATMENT  Patient Name: Claire Wall MRN: [id number] DOB:Sep 25, 1951, 72 y.o., female Today's Date: 09/22/2023  END OF SESSION:  PT End of Session - 09/22/23 1057     Visit Number 6    Number of Visits 11    Date for PT Re-Evaluation 09/22/23    PT Start Time 1009    PT Stop Time 1056    PT Time Calculation (min) 47 min    Activity Tolerance Patient tolerated treatment well    Behavior During Therapy [facility] for tasks assessed/performed             Past Medical History:  Diagnosis Date   Arthritis    knees, shoulder   Cancer (HCC) 08/2019   skin - melanoma right calf   Chest pain 2015   Resolved per patient - anxiety/stress related to work, now retired, no current problem    Hyperlipidemia    Hypertension    resolved per patient, no problems since patient retired, had a stressful job.   Pneumonia    x 1   Past Surgical History:  Procedure Laterality Date   APPENDECTOMY     bladder tack  2018   at [facility]   BREAST BIOPSY Left    COLONOSCOPY     polyps   ELBOW SURGERY Bilateral    x 2   FOOT SURGERY Left    bunion   MELANOMA EXCISION Right 09/18/2020   Procedure: WIDE LOCAL EXICISION WITH ADVANCEMENT FLAP CLOSURE RIGHT CALF MELANOMA;  Surgeon: Almond Lint, MD;  Location: [facility] OR;  Service: General;  Laterality: Right;   MELANOMA EXCISION WITH SENTINEL LYMPH NODE BIOPSY Right 10/13/2019   Procedure: WIDE LOCAL EXCISION WITH ADVANCEMENT FLAP CLOSURE AND SENTINEL LYMPH NODE BIOPSY;  Surgeon: Almond Lint, MD;  Location: [facility] OR;  Service: General;  Laterality: Right;   WISDOM TOOTH EXTRACTION     Patient Active Problem List   Diagnosis Date Noted   HTN (hypertension) 03/15/2014   Chest pain     PCP: Guerry Bruin, MD  REFERRING PROVIDER: Pixie Casino, MD  REFERRING DIAG:  Diagnosis L59.8 (ICD-10-CM) - Other specified disorders of the skin and subcutaneous tissue related to radiation Y84.2  (ICD-10-CM) - Radiological procedure and radiotherapy as the cause of abnormal reaction of the patient, or of later complication, without mention of misadventure at the time of the procedure  THERAPY DIAG:  Disorder of the skin and subcutaneous tissue related to radiation, unspecified  Lymphedema, not elsewhere classified  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 The tape stayed on. I had some tingling.    Eval: I am in remission from melanoma. I had 3 radiation treatments with nano particles in my R thigh. The particles were supposed to decrease the number of radiation treatments I would need. It was supposed to make Keytruda more effective. Last year in the fall the place where I had radiation was hard and sore to touch. Sometimes it burns and stings. I discussed with [facility] about wether or not red light therapy or something would work. I had my PT work on massaging it. I see a PT for my back and my knees and my hip. The immuno therapy attacked my joints so it destroyed cartilage in my hip. I had to have a R hip replacement. It caused increased arthritis in knees and back. I also go to a massage therapist who has done some massaging to this place to loosen it up. My oncologist told me in May that other people in the trial were having the same problem as me and he prescribe pentoxifylline to help with the fibrosis and after 5 days I stopped it. I had back surgery 06/10/23. Tried medicine again 08/03/23 - week of and I felt achy again.   PERTINENT HISTORY: 07/2019 - R calf melanoma excision with SLNB inguinal 0/2, 08/2020 R calf melanoma excision,  11/2020 R calf melanoma saw Dr. Azzie Almas Mochos in 12/2020 - had PET scan which showed melanoma in the calf did T-VEC injections every 2 weeks from 12/2020-04/2021, found a nodule in R inguinal area and inner thigh, went on immunotherapy 06/2021,  just had a biopsy to remove node in groin 1/1, was on immunotherapy for 6 weeks and the place in the medial thigh was growing and another node was swollen in the groin, 07/2021 had genetic testing, 08/12/21 did radiation to inner R thigh which is when the nano particles were used as part of study to reduce the number of radiation treatments needed and improve effectiveness of immunotherapy, had 3 radiation treatments, did immunotherapy until 05/2022 when she had to have a R hip replacement in 08/2022 and now is monitored with PET scans  PAIN:  Are you having pain? No   PRECAUTIONS: Other: R THA 08/2022, micro discetomy 04/2023  RED FLAGS: None   WEIGHT BEARING RESTRICTIONS: No  FALLS:  Has patient fallen in last 6 months? No  LIVING ENVIRONMENT: Lives with: lives with their spouse Lives in: House/apartment Stairs: Yes; does not need to use them frequently in the house Has following equipment at home: Grab bars  OCCUPATION: retired  LEISURE: stretching and general strengthening 4-5x/wk for 15-20 min, walks  PRIOR LEVEL OF FUNCTION: Independent  PATIENT GOALS: to see if we can break down the fibrosis I have on my inner thigh and see if it can look any better   OBJECTIVE:  COGNITION: Overall cognitive status: Within functional limits for tasks assessed   PALPATION: Tightness and fibrosis palpable at medial thigh  OBSERVATIONS / OTHER ASSESSMENTS: indented skin at medial thigh, redness that pt reports has been there since radiation   LYMPHEDEMA ASSESSMENTS:   SURGERY TYPE/DATE: 2 wide excision biopsies of R calf in 2020 and SLNB (0/2), lymph node dissection (1/1) 2022  NUMBER OF LYMPH NODES REMOVED: 2 in 2020 both negative, 1 in 2022 that was  positive  RADIATION:completed in 2022   LYMPHEDEMA ASSESSMENTS: will assess at next session, unable today due to time constraints  LOWER EXTREMITY  LANDMARK RIGHT eval  At groin   30 cm proximal to suprapatella   20 cm proximal to suprapatella 54.5  10 cm proximal to suprapatella 46.3  At midpatella / popliteal crease 42.5  30 cm proximal to floor at lateral plantar foot 39.4  20 cm proximal to floor at lateral plantar foot 30  10 cm proximal to floor at lateral plantar foot 21.5  Circumference of ankle/heel   5 cm proximal to 1st MTP joint 20.1  Across MTP joint 22.9  Around proximal great toe 7.5  (Blank rows = not tested)  LOWER EXTREMITY LANDMARK LEFT eval  At groin   30 cm proximal to suprapatella   20 cm proximal to suprapatella 55.1  10 cm proximal to suprapatella 47.5  At midpatella / popliteal crease 41.5  30 cm proximal to floor at lateral plantar foot 39  20 cm proximal to floor at lateral plantar foot 28.6  10 cm proximal to floor at lateral plantar foot 22  Circumference of ankle/heel   5 cm proximal to 1st MTP joint 20.1  Across MTP joint 22.6  Around proximal great toe 7  (Blank rows = not tested)   TODAY'S TREATMENT:                                                                                                                                          DATE:  09/22/23: Suction cupping initially without cocoa butter to improve mobility to area of fibrosis on inner R thigh then added cocoa butter and pulled upwards moving in all directions to improve mobility  MFR to scar on inner R thigh to improve mobility STM to scar on inner R thigh to decrease fibrosis Educated pt to wear compression for about 4 hours today, all day the day of her flight tomorrow, then 4 hours the next day  09/17/23: Removed kinesiotape and assessed skin Manual lymph drainage in supine: short neck, 5 diaphragmatic breaths, right axillary nodes,  right inguino-axillary anastamoses,  right lower extremity from groin moving to knee then retracing steps. Gentle MFR to area of scar on inner R thigh in all directions to help improve mobility Performed suction cupping to area of fibrosis at medial thigh using cocoa butter and suction cup moving cup in all directions while gently pulling upwards on cup with improved ability to move cup today Measured pt for thigh high stockings and educated pt to either order from [facility] in order to get them here in time for her trip next Wedneday or go to [facility]: Medi Assure 20-49mmHg, Med Avg length or Juzo basic II avg length 20-71mmHg Created chip pack for pt to wear in leggings or bike shorts for additional compression Educated pt in availability of silicone scar tape to try  09/15/23: Gentle MFR to area of scar on inner R thigh in all directions to help improve mobility Performed suction cupping  to area of fibrosis at medial thigh using cocoa butter and suction cup moving cup in all directions while gently pulling upwards on cup Abbreviated MLD moving fluid from inner thigh to outer thigh while in sidelying then back to supine to move fluid up towards R axilla Kinesiotaping as follows: lattice work pattern with approx 1/4 in wide strips - pt reports she has used kinesiotape in the past with no issues or skin reactions  09/10/23: Manual lymph drainage in supine: short neck, right axillary nodes, 5 diaphragmatic breaths, right inguino-axillary anastamoses, L inginal nodes and anterior inguinal anastomosis, R inguinal nodes, right lower extremity from knee to hip moving from inner thigh to outer thigh then retracing all steps. Educated pt throughout and educated pt in proper technique including skin stretch and sequence and issued handout to patient to practice at home. Had pt demonstrate correct skin stretch technique.  Gentle MFR to area of scar on inner R thigh in all directions to help improve mobility During MLD performed suction cupping to  area of fibrosis at medial thigh using cocoa butter and suction cup moving cup in all directions while gently pulling upwards on cup  09/08/23: Manual lymph drainage in supine: short neck, right axillary nodes, 5 diaphragmatic breaths, right inguino-axillary anastamoses, L inginal nodes and anterior inguinal anastomosis, R inguinal nodes, right lower extremity from knee to hip moving from inner thigh to outer thigh then retracing all steps During MLD performed suction cupping to area of fibrosis at medial thigh using cocoa butter and suction cup moving cup in all directions while gently pulling upwards on cup   08/18/23: none today -eval only due to time constraints   PATIENT EDUCATION:  Education details: MLD and how this helps fibrosis, cupping to help improve skin mobility Person educated: Patient Education method: Explanation Education comprehension: verbalized understanding  HOME EXERCISE PROGRAM: Self MLD for R LE with focus on inner R thigh  ASSESSMENT:  CLINICAL IMPRESSION: Pt bought scar tape and has been wearing that over the last few days. She demonstrates decreased fibrosis and increased mobility of skin in area of scar/fibrosis at inner thigh. She ordered a thigh high compression stocking to wear on her plane flight tomorrow. Educated pt to taper on and taper off of compression. Continued with STM, MFR and suction cupping to decrease fibrosis.   OBJECTIVE IMPAIRMENTS: difficulty walking, increased edema, increased fascial restrictions, impaired sensation, and pain.   ACTIVITY LIMITATIONS: sitting and sleeping  PARTICIPATION LIMITATIONS:  none  PERSONAL FACTORS: Time since onset of injury/illness/exacerbation are also affecting patient's functional outcome.   REHAB POTENTIAL: Good  CLINICAL DECISION MAKING: Stable/uncomplicated  EVALUATION COMPLEXITY: Low   GOALS: Goals reviewed with patient? Yes  SHORT TERM GOALS=LONG TERM GOALS Target date: 09/22/23  Pt will be  independent in MLD for long term management of fibrosis and edema.  Baseline: Goal status: INITIAL  2.  Pt will report a 75% improvement in pain and discomfort in medial R thigh to allow her to cross her legs without pain. Baseline:  Goal status: INITIAL  3.  Pt will obtain a compression garment to decrease Wall of lymphedema and help decrease discomfort.  Baseline:  Goal status: INITIAL  4.  Pt will have a 50% improvement in fibrosis in R medial thigh to allow improved comfort.  Baseline:  Goal status: INITIAL   PLAN:  PT FREQUENCY: 2x/week  PT DURATION: other: 5 weeks  PLANNED INTERVENTIONS: Therapeutic exercises, Therapeutic activity, Patient/Family education, Self Care, Orthotic/Fit training, Manual lymph  drainage, Compression bandaging, scar mobilization, Taping, Vasopneumatic device, and Manual therapy  PLAN FOR NEXT SESSION: continue MLD to R LE, suction cupping to R thigh, MFR   Aramis Zobel Breedlove Blue, PT 09/22/2023, 11:59 AM

## 2023-09-30 ENCOUNTER — Encounter: Payer: PPO | Admitting: Physical Therapy

## 2023-10-05 DIAGNOSIS — Z471 Aftercare following joint replacement surgery: Secondary | ICD-10-CM | POA: Diagnosis not present

## 2023-10-05 DIAGNOSIS — M25651 Stiffness of right hip, not elsewhere classified: Secondary | ICD-10-CM | POA: Diagnosis not present

## 2023-10-05 DIAGNOSIS — R262 Difficulty in walking, not elsewhere classified: Secondary | ICD-10-CM | POA: Diagnosis not present

## 2023-10-06 ENCOUNTER — Ambulatory Visit: Payer: PPO | Admitting: Physical Therapy

## 2023-10-06 ENCOUNTER — Encounter: Payer: Self-pay | Admitting: Physical Therapy

## 2023-10-06 DIAGNOSIS — L599 Disorder of the skin and subcutaneous tissue related to radiation, unspecified: Secondary | ICD-10-CM | POA: Diagnosis not present

## 2023-10-06 DIAGNOSIS — I890 Lymphedema, not elsewhere classified: Secondary | ICD-10-CM

## 2023-10-06 DIAGNOSIS — Y842 Radiological procedure and radiotherapy as the cause of abnormal reaction of the patient, or of later complication, without mention of misadventure at the time of the procedure: Secondary | ICD-10-CM

## 2023-10-06 NOTE — Therapy (Signed)
OUTPATIENT PHYSICAL THERAPY  LOWER EXTREMITY ONCOLOGY TREATMENT  Patient Name: MARGEE CONK MRN: [id number] DOB:[birth year], 72 y.o., female Today's Date: 10/06/2023  END OF SESSION:  PT End of Session - 10/06/23 1405     Visit Number 7    Number of Visits 11    Date for PT Re-Evaluation 11/03/23    PT Start Time 1404    PT Stop Time 1446    PT Time Calculation (min) 42 min    Activity Tolerance Patient tolerated treatment well    Behavior During Therapy [facility] for tasks assessed/performed             Past Medical History:  Diagnosis Date   Arthritis    knees, shoulder   Cancer (HCC) 08/2019   skin - melanoma right calf   Chest pain 2015   Resolved per patient - anxiety/stress related to work, now retired, no current problem    Hyperlipidemia    Hypertension    resolved per patient, no problems since patient retired, had a stressful job.   Pneumonia    x 1   Past Surgical History:  Procedure Laterality Date   APPENDECTOMY     bladder tack  2018   at [facility]   BREAST BIOPSY Left    COLONOSCOPY     polyps   ELBOW SURGERY Bilateral    x 2   FOOT SURGERY Left    bunion   MELANOMA EXCISION Right 09/18/2020   Procedure: WIDE LOCAL EXICISION WITH ADVANCEMENT FLAP CLOSURE RIGHT CALF MELANOMA;  Surgeon: Almond Lint, MD;  Location: [facility] OR;  Service: General;  Laterality: Right;   MELANOMA EXCISION WITH SENTINEL LYMPH NODE BIOPSY Right 10/13/2019   Procedure: WIDE LOCAL EXCISION WITH ADVANCEMENT FLAP CLOSURE AND SENTINEL LYMPH NODE BIOPSY;  Surgeon: Almond Lint, MD;  Location: [facility] OR;  Service: General;  Laterality: Right;   WISDOM TOOTH EXTRACTION     Patient Active Problem List   Diagnosis Date Noted   HTN (hypertension) 03/15/2014   Chest pain     PCP: Guerry Bruin, MD  REFERRING PROVIDER: Pixie Casino, MD  REFERRING DIAG:  Diagnosis L59.8 (ICD-10-CM) - Other specified disorders of the skin and subcutaneous tissue related to  radiation Y84.2 (ICD-10-CM) - Radiological procedure and radiotherapy as the cause of abnormal reaction of the patient, or of later complication, without mention of misadventure at the time of the procedure  THERAPY DIAG:  Disorder of the skin and subcutaneous tissue related to radiation, unspecified  Lymphedema, not elsewhere classified  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 I used the scar tape and I cupped my leg twice while I was gone. It gets hard after a few days of not cupping.    Eval: I am in remission from melanoma. I had 3 radiation treatments with nano particles in my R thigh. The particles were supposed to decrease the number of radiation treatments I would need. It was supposed to make Keytruda more effective. Last year in the fall the place where I had radiation was hard and sore to touch. Sometimes it burns and stings. I discussed with [facility] about wether or not red light therapy or something would work. I had my PT work on massaging it. I see a PT for my back and my knees and my hip. The immuno therapy attacked my joints so it destroyed cartilage in my hip. I had to have a R hip replacement. It caused increased arthritis in knees and back. I also go to a massage therapist who has done some massaging to this place to loosen it up. My oncologist told me in May that other people in the trial were having the same problem as me and he prescribe pentoxifylline to help with the fibrosis and after 5 days I stopped it. I had back surgery 06/10/23. Tried medicine again 08/03/23 - week of and I felt achy again.   PERTINENT HISTORY: 07/2019 - R calf  melanoma excision with SLNB inguinal 0/2, 08/2020 R calf melanoma excision, 11/2020 R calf melanoma saw Dr. Azzie Almas Mochos in 12/2020 - had PET scan which showed melanoma in the calf did T-VEC injections every 2 weeks from 12/2020-04/2021, found a nodule in R inguinal area and inner thigh, went on immunotherapy 06/2021,  just had a biopsy to remove node in groin 1/1, was on immunotherapy for 6 weeks and the place in the medial thigh was growing and another node was swollen in the groin, 07/2021 had genetic testing, 08/12/21 did radiation to inner R thigh which is when the nano particles were used as part of study to reduce the number of radiation treatments needed and improve effectiveness of immunotherapy, had 3 radiation treatments, did immunotherapy until 05/2022 when she had to have a R hip replacement in 08/2022 and now is monitored with PET scans  PAIN:  Are you having pain? Yes 2/10, burns and tingles in R inner thigh, nothing makes it better or worse   PRECAUTIONS: Other: R THA 08/2022, micro discetomy 04/2023  RED FLAGS: None   WEIGHT BEARING RESTRICTIONS: No  FALLS:  Has patient fallen in last 6 months? No  LIVING ENVIRONMENT: Lives with: lives with their spouse Lives in: House/apartment Stairs: Yes; does not need to use them frequently in the house Has following equipment at home: Grab bars  OCCUPATION: retired  LEISURE: stretching and general strengthening 4-5x/wk for 15-20 min, walks  PRIOR LEVEL OF FUNCTION: Independent  PATIENT GOALS: to see if we can break down the fibrosis I have on my inner thigh and see if it can look any better   OBJECTIVE:  COGNITION: Overall cognitive status: Within functional limits for tasks assessed   PALPATION: Tightness and fibrosis palpable at medial thigh  OBSERVATIONS / OTHER ASSESSMENTS: indented skin at medial thigh, redness that pt reports has been there since radiation   LYMPHEDEMA ASSESSMENTS:   SURGERY TYPE/DATE: 2 wide excision  biopsies of R calf in 2020 and SLNB (0/2), lymph node dissection (1/1) 2022  NUMBER OF LYMPH NODES REMOVED: 2 in 2020 both  negative, 1 in 2022 that was positive  RADIATION:completed in 2022   LYMPHEDEMA ASSESSMENTS: will assess at next session, unable today due to time constraints  LOWER EXTREMITY LANDMARK RIGHT eval  At groin   30 cm proximal to suprapatella   20 cm proximal to suprapatella 54.5  10 cm proximal to suprapatella 46.3  At midpatella / popliteal crease 42.5  30 cm proximal to floor at lateral plantar foot 39.4  20 cm proximal to floor at lateral plantar foot 30  10 cm proximal to floor at lateral plantar foot 21.5  Circumference of ankle/heel   5 cm proximal to 1st MTP joint 20.1  Across MTP joint 22.9  Around proximal great toe 7.5  (Blank rows = not tested)  LOWER EXTREMITY LANDMARK LEFT eval  At groin   30 cm proximal to suprapatella   20 cm proximal to suprapatella 55.1  10 cm proximal to suprapatella 47.5  At midpatella / popliteal crease 41.5  30 cm proximal to floor at lateral plantar foot 39  20 cm proximal to floor at lateral plantar foot 28.6  10 cm proximal to floor at lateral plantar foot 22  Circumference of ankle/heel   5 cm proximal to 1st MTP joint 20.1  Across MTP joint 22.6  Around proximal great toe 7  (Blank rows = not tested)   TODAY'S TREATMENT:                                                                                                                                          DATE:  10/06/23: Assessed progress towards goals Suction cupping with cocoa butter- pulled upwards moving in all directions to improve mobility  MFR to scar on inner R thigh to improve mobility STM to scar on inner R thigh to decrease fibrosis Brief MLD stretching skin towards the outer thigh   09/22/23: Suction cupping initially without cocoa butter to improve mobility to area of fibrosis on inner R thigh then added cocoa butter and pulled upwards  moving in all directions to improve mobility  MFR to scar on inner R thigh to improve mobility STM to scar on inner R thigh to decrease fibrosis Educated pt to wear compression for about 4 hours today, all day the day of her flight tomorrow, then 4 hours the next day  09/17/23: Removed kinesiotape and assessed skin Manual lymph drainage in supine: short neck, 5 diaphragmatic breaths, right axillary nodes,  right inguino-axillary anastamoses, right lower extremity from groin moving to knee then retracing steps. Gentle MFR to area of scar on inner R thigh in all directions to help improve mobility Performed suction cupping to area of fibrosis at medial thigh using cocoa butter and suction cup moving cup in all directions while gently pulling upwards on cup with improved ability to move cup today Measured pt for thigh high stockings and educated pt to either order from  [employer] in order to get them here in time for her trip next Wedneday or go to [facility]: Medi Assure 20-35mmHg, Med Avg length or Juzo basic II avg length 20-32mmHg Created chip pack for pt to wear in leggings or bike shorts for additional compression Educated pt in availability of silicone scar tape to try  09/15/23: Gentle MFR to area of scar on inner R thigh in all directions to help improve mobility Performed suction cupping to area of fibrosis at medial thigh using cocoa butter and suction cup moving cup in all directions while gently pulling upwards on cup Abbreviated MLD moving fluid from inner thigh to outer thigh while in sidelying then back to supine to move fluid up towards R axilla Kinesiotaping as follows: lattice work pattern with approx 1/4 in wide strips - pt reports she has used kinesiotape in the past with no issues or skin reactions  09/10/23: Manual lymph drainage in supine: short neck, right axillary nodes, 5 diaphragmatic breaths, right inguino-axillary anastamoses, L inginal nodes and anterior inguinal  anastomosis, R inguinal nodes, right lower extremity from knee to hip moving from inner thigh to outer thigh then retracing all steps. Educated pt throughout and educated pt in proper technique including skin stretch and sequence and issued handout to patient to practice at home. Had pt demonstrate correct skin stretch technique.  Gentle MFR to area of scar on inner R thigh in all directions to help improve mobility During MLD performed suction cupping to area of fibrosis at medial thigh using cocoa butter and suction cup moving cup in all directions while gently pulling upwards on cup  09/08/23: Manual lymph drainage in supine: short neck, right axillary nodes, 5 diaphragmatic breaths, right inguino-axillary anastamoses, L inginal nodes and anterior inguinal anastomosis, R inguinal nodes, right lower extremity from knee to hip moving from inner thigh to outer thigh then retracing all steps During MLD performed suction cupping to area of fibrosis at medial thigh using cocoa butter and suction cup moving cup in all directions while gently pulling upwards on cup   08/18/23: none today -eval only due to time constraints   PATIENT EDUCATION:  Education details: MLD and how this helps fibrosis, cupping to help improve skin mobility Person educated: Patient Education method: Explanation Education comprehension: verbalized understanding  HOME EXERCISE PROGRAM: Self MLD for R LE with focus on inner R thigh  ASSESSMENT:  CLINICAL IMPRESSION: Assessed pt's progress towards goals in therapy. She is progressing towards all goals. She was doing very well but was on vacation for a week and then missed last week due to a family member's illness. She has done suction cupping twice since last session. She would benefit from a few more skilled PT visits to help pt to continue to progress towards goals and transition her to independent management of her scar and fibrosis.   OBJECTIVE IMPAIRMENTS: difficulty  walking, increased edema, increased fascial restrictions, impaired sensation, and pain.   ACTIVITY LIMITATIONS: sitting and sleeping  PARTICIPATION LIMITATIONS:  none  PERSONAL FACTORS: Time since onset of injury/illness/exacerbation are also affecting patient's functional outcome.   REHAB POTENTIAL: Good  CLINICAL DECISION MAKING: Stable/uncomplicated  EVALUATION COMPLEXITY: Low   GOALS: Goals reviewed with patient? Yes  SHORT TERM GOALS=LONG TERM GOALS Target date: 09/22/23  Pt will be independent in MLD for long term management of fibrosis and edema.  Baseline: Goal status: MET 10/06/23  2.  Pt will report a 75% improvement in pain and discomfort in medial R thigh  to allow her to cross her legs without pain. Baseline:  Goal status: IN PROGRESS 25% better 10/06/23: up until her vacation there was less burning and stinging but has regressed since vacation  3.  Pt will obtain a compression garment to decrease risk of lymphedema and help decrease discomfort.  Baseline:  Goal status: MET 10/05/24  4.  Pt will have a 50% improvement in fibrosis in R medial thigh to allow improved comfort.  Baseline:  Goal status: IN PROGRESS 20% improved   PLAN:  PT FREQUENCY: 2x/week  PT DURATION: other: 4 weeks  PLANNED INTERVENTIONS: Therapeutic exercises, Therapeutic activity, Patient/Family education, Self Care, Orthotic/Fit training, Manual lymph drainage, Compression bandaging, scar mobilization, Taping, Vasopneumatic device, and Manual therapy  PLAN FOR NEXT SESSION: continue MLD to R LE, suction cupping to R thigh, MFR   Alexza Norbeck Breedlove Blue, PT 10/06/2023, 2:48 PM

## 2023-10-08 ENCOUNTER — Encounter: Payer: Self-pay | Admitting: Physical Therapy

## 2023-10-08 ENCOUNTER — Ambulatory Visit: Payer: PPO | Admitting: Physical Therapy

## 2023-10-08 DIAGNOSIS — R262 Difficulty in walking, not elsewhere classified: Secondary | ICD-10-CM | POA: Diagnosis not present

## 2023-10-08 DIAGNOSIS — I890 Lymphedema, not elsewhere classified: Secondary | ICD-10-CM

## 2023-10-08 DIAGNOSIS — L599 Disorder of the skin and subcutaneous tissue related to radiation, unspecified: Secondary | ICD-10-CM | POA: Diagnosis not present

## 2023-10-08 DIAGNOSIS — Z471 Aftercare following joint replacement surgery: Secondary | ICD-10-CM | POA: Diagnosis not present

## 2023-10-08 DIAGNOSIS — Y842 Radiological procedure and radiotherapy as the cause of abnormal reaction of the patient, or of later complication, without mention of misadventure at the time of the procedure: Secondary | ICD-10-CM

## 2023-10-08 DIAGNOSIS — M25651 Stiffness of right hip, not elsewhere classified: Secondary | ICD-10-CM | POA: Diagnosis not present

## 2023-10-08 NOTE — Therapy (Signed)
OUTPATIENT PHYSICAL THERAPY  LOWER EXTREMITY ONCOLOGY TREATMENT  Patient Name: Claire Wall MRN: [id number] DOB:1951-04-09, 72 y.o., female Today's Date: 10/08/2023  END OF SESSION:  PT End of Session - 10/08/23 1403     Visit Number 8    Number of Visits 11    Date for PT Re-Evaluation 11/03/23    PT Start Time 1403    PT Stop Time 1444    PT Time Calculation (min) 41 min    Activity Tolerance Patient tolerated treatment well    Behavior During Therapy [facility] for tasks assessed/performed             Past Medical History:  Diagnosis Date   Arthritis    knees, shoulder   Cancer (HCC) 08/2019   skin - melanoma right calf   Chest pain 2015   Resolved per patient - anxiety/stress related to work, now retired, no current problem    Hyperlipidemia    Hypertension    resolved per patient, no problems since patient retired, had a stressful job.   Pneumonia    x 1   Past Surgical History:  Procedure Laterality Date   APPENDECTOMY     bladder tack  2018   at [facility]   BREAST BIOPSY Left    COLONOSCOPY     polyps   ELBOW SURGERY Bilateral    x 2   FOOT SURGERY Left    bunion   MELANOMA EXCISION Right 09/18/2020   Procedure: WIDE LOCAL EXICISION WITH ADVANCEMENT FLAP CLOSURE RIGHT CALF MELANOMA;  Surgeon: Almond Lint, MD;  Location: [facility] OR;  Service: General;  Laterality: Right;   MELANOMA EXCISION WITH SENTINEL LYMPH NODE BIOPSY Right 10/13/2019   Procedure: WIDE LOCAL EXCISION WITH ADVANCEMENT FLAP CLOSURE AND SENTINEL LYMPH NODE BIOPSY;  Surgeon: Almond Lint, MD;  Location: [facility] OR;  Service: General;  Laterality: Right;   WISDOM TOOTH EXTRACTION     Patient Active Problem List   Diagnosis Date Noted   HTN (hypertension) 03/15/2014   Chest pain     PCP: Guerry Bruin, MD  REFERRING PROVIDER: Pixie Casino, MD  REFERRING DIAG:  Diagnosis L59.8 (ICD-10-CM) - Other specified disorders of the skin and subcutaneous tissue related to  radiation Y84.2 (ICD-10-CM) - Radiological procedure and radiotherapy as the cause of abnormal reaction of the patient, or of later complication, without mention of misadventure at the time of the procedure  THERAPY DIAG:  Disorder of the skin and subcutaneous tissue related to radiation, unspecified  Lymphedema, not elsewhere classified  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    Eval: I am in remission from melanoma. I had 3 radiation treatments with nano particles in my R thigh. The particles were supposed to decrease the number of radiation treatments I would need. It was supposed to make Keytruda more effective. Last year in the fall the place where I had radiation was hard and sore to touch. Sometimes it burns and stings. I discussed with [facility] about wether or not red light therapy or something would work. I had my PT work on massaging it. I see a PT for my back and my knees and my hip. The immuno therapy attacked my joints so it destroyed cartilage in my hip. I had to have a R hip replacement. It caused increased arthritis in knees and back. I also go to a massage therapist who has done some massaging to this place to loosen it up. My oncologist told me in May that other people in the trial were having the same problem as me and he prescribe pentoxifylline to help with the fibrosis and after 5 days I stopped it. I had back surgery 06/10/23. Tried medicine again 08/03/23 - week of and I felt achy again.   PERTINENT HISTORY: 07/2019 - R calf melanoma excision with SLNB inguinal 0/2, 08/2020 R calf melanoma excision,  11/2020 R calf melanoma saw Dr. Azzie Almas Mochos in 12/2020 - had PET scan which showed melanoma in the calf did T-VEC injections every 2 weeks from 12/2020-04/2021, found a nodule in R inguinal area and inner thigh, went on immunotherapy 06/2021,  just had a biopsy to remove node in groin 1/1, was on immunotherapy for 6 weeks and the place in the medial thigh was growing and another node was swollen in the groin, 07/2021 had genetic testing, 08/12/21 did radiation to inner R thigh which is when the nano particles were used as part of study to reduce the number of radiation treatments needed and improve effectiveness of immunotherapy, had 3 radiation treatments, did immunotherapy until 05/2022 when she had to have a R hip replacement in 08/2022 and now is monitored with PET scans  PAIN:  Are you having pain? No   PRECAUTIONS: Other: R THA 08/2022, micro discetomy 04/2023  RED FLAGS: None   WEIGHT BEARING RESTRICTIONS: No  FALLS:  Has patient fallen in last 6 months? No  LIVING ENVIRONMENT: Lives with: lives with their spouse Lives in: House/apartment Stairs: Yes; does not need to use them frequently in the house Has following equipment at home: Grab bars  OCCUPATION: retired  LEISURE: stretching and general strengthening 4-5x/wk for 15-20 min, walks  PRIOR LEVEL OF FUNCTION: Independent  PATIENT GOALS: to see if we can break down the fibrosis I have on my inner thigh and see if it can look any better   OBJECTIVE:  COGNITION: Overall cognitive status: Within functional limits for tasks assessed   PALPATION: Tightness and fibrosis palpable at medial thigh  OBSERVATIONS / OTHER ASSESSMENTS: indented skin at medial thigh, redness that pt reports has been there since radiation   LYMPHEDEMA ASSESSMENTS:   SURGERY TYPE/DATE: 2 wide excision biopsies of R calf in 2020 and SLNB (0/2), lymph node dissection (1/1) 2022  NUMBER OF LYMPH NODES REMOVED: 2 in 2020 both negative, 1 in 2022 that was  positive  RADIATION:completed in 2022   LYMPHEDEMA ASSESSMENTS: will assess at next session, unable today due to time constraints  LOWER EXTREMITY LANDMARK RIGHT  eval  At groin   30 cm proximal to suprapatella   20 cm proximal to suprapatella 54.5  10 cm proximal to suprapatella 46.3  At midpatella / popliteal crease 42.5  30 cm proximal to floor at lateral plantar foot 39.4  20 cm proximal to floor at lateral plantar foot 30  10 cm proximal to floor at lateral plantar foot 21.5  Circumference of ankle/heel   5 cm proximal to 1st MTP joint 20.1  Across MTP joint 22.9  Around proximal great toe 7.5  (Blank rows = not tested)  LOWER EXTREMITY LANDMARK LEFT eval  At groin   30 cm proximal to suprapatella   20 cm proximal to suprapatella 55.1  10 cm proximal to suprapatella 47.5  At midpatella / popliteal crease 41.5  30 cm proximal to floor at lateral plantar foot 39  20 cm proximal to floor at lateral plantar foot 28.6  10 cm proximal to floor at lateral plantar foot 22  Circumference of ankle/heel   5 cm proximal to 1st MTP joint 20.1  Across MTP joint 22.6  Around proximal great toe 7  (Blank rows = not tested)   TODAY'S TREATMENT:                                                                                                                                          DATE:  10/08/23: Suction cupping with cocoa butter to R inner thigh to improve mobility and decrease fibrosis, moving in all directions while pulling upwards IASTM to R inner thigh using a variety of methods including scraping and massage to help decrease fibrosis with increased softening noted by end of session  10/06/23: Assessed progress towards goals Suction cupping with cocoa butter- pulled upwards moving in all directions to improve mobility  MFR to scar on inner R thigh to improve mobility STM to scar on inner R thigh to decrease fibrosis Brief MLD stretching skin towards the outer  thigh   09/22/23: Suction cupping initially without cocoa butter to improve mobility to area of fibrosis on inner R thigh then added cocoa butter and pulled upwards moving in all directions to improve mobility  MFR to scar on inner R thigh to improve mobility STM to scar on inner R thigh to decrease fibrosis Educated pt to wear compression for about 4 hours today, all day the day of her flight tomorrow, then 4 hours the next day  09/17/23: Removed kinesiotape and assessed skin Manual lymph drainage in supine: short neck, 5 diaphragmatic breaths, right axillary nodes,  right inguino-axillary anastamoses, right lower extremity from groin moving to knee then retracing steps. Gentle MFR to area of scar on inner R thigh in all directions to help improve mobility Performed suction cupping to area of fibrosis at medial thigh using cocoa butter and suction cup moving cup in all directions while gently pulling upwards on cup with  improved ability to move cup today Measured pt for thigh high stockings and educated pt to either order from [facility] in order to get them here in time for her trip next Wedneday or go to [facility]: Medi Assure 20-29mmHg, Med Avg length or Juzo basic II avg length 20-39mmHg Created chip pack for pt to wear in leggings or bike shorts for additional compression Educated pt in availability of silicone scar tape to try  09/15/23: Gentle MFR to area of scar on inner R thigh in all directions to help improve mobility Performed suction cupping to area of fibrosis at medial thigh using cocoa butter and suction cup moving cup in all directions while gently pulling upwards on cup Abbreviated MLD moving fluid from inner thigh to outer thigh while in sidelying then back to supine to move fluid up towards R axilla Kinesiotaping as follows: lattice work pattern with approx 1/4 in wide strips - pt reports she has used kinesiotape in the past with no issues or skin reactions  09/10/23: Manual  lymph drainage in supine: short neck, right axillary nodes, 5 diaphragmatic breaths, right inguino-axillary anastamoses, L inginal nodes and anterior inguinal anastomosis, R inguinal nodes, right lower extremity from knee to hip moving from inner thigh to outer thigh then retracing all steps. Educated pt throughout and educated pt in proper technique including skin stretch and sequence and issued handout to patient to practice at home. Had pt demonstrate correct skin stretch technique.  Gentle MFR to area of scar on inner R thigh in all directions to help improve mobility During MLD performed suction cupping to area of fibrosis at medial thigh using cocoa butter and suction cup moving cup in all directions while gently pulling upwards on cup  09/08/23: Manual lymph drainage in supine: short neck, right axillary nodes, 5 diaphragmatic breaths, right inguino-axillary anastamoses, L inginal nodes and anterior inguinal anastomosis, R inguinal nodes, right lower extremity from knee to hip moving from inner thigh to outer thigh then retracing all steps During MLD performed suction cupping to area of fibrosis at medial thigh using cocoa butter and suction cup moving cup in all directions while gently pulling upwards on cup   08/18/23: none today -eval only due to time constraints   PATIENT EDUCATION:  Education details: MLD and how this helps fibrosis, cupping to help improve skin mobility Person educated: Patient Education method: Explanation Education comprehension: verbalized understanding  HOME EXERCISE PROGRAM: Self MLD for R LE with focus on inner R thigh  ASSESSMENT:  CLINICAL IMPRESSION: Began IASTM with the WAVE tool today using several different technique such as scraping and massage to continue to improve skin mobility and decrease fibrosis. Used this in addition to cupping. There was decreased fibrosis and improved mobility noted by end of session. Issued info to pt per pt request as she  would like to purchase one for home use.   OBJECTIVE IMPAIRMENTS: difficulty walking, increased edema, increased fascial restrictions, impaired sensation, and pain.   ACTIVITY LIMITATIONS: sitting and sleeping  PARTICIPATION LIMITATIONS:  none  PERSONAL FACTORS: Time since onset of injury/illness/exacerbation are also affecting patient's functional outcome.   REHAB POTENTIAL: Good  CLINICAL DECISION MAKING: Stable/uncomplicated  EVALUATION COMPLEXITY: Low   GOALS: Goals reviewed with patient? Yes  SHORT TERM GOALS=LONG TERM GOALS Target date: 09/22/23  Pt will be independent in MLD for long term management of fibrosis and edema.  Baseline: Goal status: MET 10/06/23  2.  Pt will report a 75% improvement in pain and discomfort  in medial R thigh to allow her to cross her legs without pain. Baseline:  Goal status: IN PROGRESS 25% better 10/06/23: up until her vacation there was less burning and stinging but has regressed since vacation  3.  Pt will obtain a compression garment to decrease risk of lymphedema and help decrease discomfort.  Baseline:  Goal status: MET 10/05/24  4.  Pt will have a 50% improvement in fibrosis in R medial thigh to allow improved comfort.  Baseline:  Goal status: IN PROGRESS 20% improved   PLAN:  PT FREQUENCY: 2x/week  PT DURATION: other: 4 weeks  PLANNED INTERVENTIONS: Therapeutic exercises, Therapeutic activity, Patient/Family education, Self Care, Orthotic/Fit training, Manual lymph drainage, Compression bandaging, scar mobilization, Taping, Vasopneumatic device, and Manual therapy  PLAN FOR NEXT SESSION: continue MLD to R LE, suction cupping to R thigh, MFR   Luzelena Heeg Breedlove Blue, PT 10/08/2023, 2:57 PM

## 2023-10-13 ENCOUNTER — Ambulatory Visit: Payer: PPO | Admitting: Physical Therapy

## 2023-10-13 ENCOUNTER — Encounter: Payer: Self-pay | Admitting: Physical Therapy

## 2023-10-13 DIAGNOSIS — M25651 Stiffness of right hip, not elsewhere classified: Secondary | ICD-10-CM | POA: Diagnosis not present

## 2023-10-13 DIAGNOSIS — R262 Difficulty in walking, not elsewhere classified: Secondary | ICD-10-CM | POA: Diagnosis not present

## 2023-10-13 DIAGNOSIS — L598 Other specified disorders of the skin and subcutaneous tissue related to radiation: Secondary | ICD-10-CM

## 2023-10-13 DIAGNOSIS — I890 Lymphedema, not elsewhere classified: Secondary | ICD-10-CM

## 2023-10-13 DIAGNOSIS — L599 Disorder of the skin and subcutaneous tissue related to radiation, unspecified: Secondary | ICD-10-CM

## 2023-10-13 DIAGNOSIS — Z471 Aftercare following joint replacement surgery: Secondary | ICD-10-CM | POA: Diagnosis not present

## 2023-10-13 NOTE — Therapy (Signed)
OUTPATIENT PHYSICAL THERAPY  LOWER EXTREMITY ONCOLOGY TREATMENT  Patient Name: Claire Wall MRN: [id number] DOB:1951/12/05, 72 y.o., female Today's Date: 10/13/2023  END OF SESSION:  PT End of Session - 10/13/23 1448     Visit Number 9    Number of Visits 11    Date for PT Re-Evaluation 11/03/23    PT Start Time 1353    PT Stop Time 1442    PT Time Calculation (min) 49 min    Activity Tolerance Patient tolerated treatment well    Behavior During Therapy [facility] for tasks assessed/performed             Past Medical History:  Diagnosis Date   Arthritis    knees, shoulder   Cancer (HCC) 08/2019   skin - melanoma right calf   Chest pain 2015   Resolved per patient - anxiety/stress related to work, now retired, no current problem    Hyperlipidemia    Hypertension    resolved per patient, no problems since patient retired, had a stressful job.   Pneumonia    x 1   Past Surgical History:  Procedure Laterality Date   APPENDECTOMY     bladder tack  2018   at [facility]   BREAST BIOPSY Left    COLONOSCOPY     polyps   ELBOW SURGERY Bilateral    x 2   FOOT SURGERY Left    bunion   MELANOMA EXCISION Right 09/18/2020   Procedure: WIDE LOCAL EXICISION WITH ADVANCEMENT FLAP CLOSURE RIGHT CALF MELANOMA;  Surgeon: Almond Lint, MD;  Location: [facility] OR;  Service: General;  Laterality: Right;   MELANOMA EXCISION WITH SENTINEL LYMPH NODE BIOPSY Right 10/13/2019   Procedure: WIDE LOCAL EXCISION WITH ADVANCEMENT FLAP CLOSURE AND SENTINEL LYMPH NODE BIOPSY;  Surgeon: Almond Lint, MD;  Location: [facility] OR;  Service: General;  Laterality: Right;   WISDOM TOOTH EXTRACTION     Patient Active Problem List   Diagnosis Date Noted   HTN (hypertension) 03/15/2014   Chest pain     PCP: Guerry Bruin, MD  REFERRING PROVIDER: Pixie Casino, MD  REFERRING DIAG:  Diagnosis L59.8 (ICD-10-CM) - Other specified disorders of the skin and subcutaneous tissue related to  radiation Y84.2 (ICD-10-CM) - Radiological procedure and radiotherapy as the cause of abnormal reaction of the patient, or of later complication, without mention of misadventure at the time of the procedure  THERAPY DIAG:  Disorder of the skin and subcutaneous tissue related to radiation, unspecified  Lymphedema, not elsewhere classified  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    Eval: I am in remission from melanoma. I had 3 radiation treatments with nano particles in my R thigh. The particles were supposed to decrease the number of radiation treatments I would need. It was supposed to make Keytruda more effective. Last year in the fall the place where I had radiation was hard and sore to touch. Sometimes it burns and stings. I discussed with [facility] about wether or not red light therapy or something would work. I had my PT work on massaging it. I see a PT for my back and my knees and my hip. The immuno therapy attacked my joints so it destroyed cartilage in my hip. I had to have a R hip replacement. It caused increased arthritis in knees and back. I also go to a massage therapist who has done some massaging to this place to loosen it up. My oncologist told me in May that other people in the trial were having the same problem as me and he prescribe pentoxifylline to help with the fibrosis and after 5 days I stopped it. I had back surgery 06/10/23. Tried medicine again 08/03/23 - week of and I felt achy again.   PERTINENT HISTORY: 07/2019 - R calf melanoma excision with SLNB inguinal 0/2, 08/2020 R  calf melanoma excision, 11/2020 R calf melanoma saw Dr. Azzie Almas Mochos in 12/2020 - had PET scan which showed melanoma in the calf did T-VEC injections every 2 weeks from 12/2020-04/2021, found a nodule in R inguinal area and inner thigh, went on immunotherapy 06/2021,  just had a biopsy to remove node in groin 1/1, was on immunotherapy for 6 weeks and the place in the medial thigh was growing and another node was swollen in the groin, 07/2021 had genetic testing, 08/12/21 did radiation to inner R thigh which is when the nano particles were used as part of study to reduce the number of radiation treatments needed and improve effectiveness of immunotherapy, had 3 radiation treatments, did immunotherapy until 05/2022 when she had to have a R hip replacement in 08/2022 and now is monitored with PET scans  PAIN:  Are you having pain? No   PRECAUTIONS: Other: R THA 08/2022, micro discetomy 04/2023  RED FLAGS: None   WEIGHT BEARING RESTRICTIONS: No  FALLS:  Has patient fallen in last 6 months? No  LIVING ENVIRONMENT: Lives with: lives with their spouse Lives in: House/apartment Stairs: Yes; does not need to use them frequently in the house Has following equipment at home: Grab bars  OCCUPATION: retired  LEISURE: stretching and general strengthening 4-5x/wk for 15-20 min, walks  PRIOR LEVEL OF FUNCTION: Independent  PATIENT GOALS: to see if we can break down the fibrosis I have on my inner thigh and see if it can look any better   OBJECTIVE:  COGNITION: Overall cognitive status: Within functional limits for tasks assessed   PALPATION: Tightness and fibrosis palpable at medial thigh  OBSERVATIONS / OTHER ASSESSMENTS: indented skin at medial thigh, redness that pt reports has been there since radiation   LYMPHEDEMA ASSESSMENTS:   SURGERY TYPE/DATE: 2 wide excision biopsies of R calf in 2020 and SLNB (0/2), lymph node dissection (1/1) 2022  NUMBER OF LYMPH NODES REMOVED: 2 in 2020 both  negative, 1 in 2022 that was positive  RADIATION:completed in 2022   LYMPHEDEMA ASSESSMENTS: will assess at next session, unable today due to  time constraints  LOWER EXTREMITY LANDMARK RIGHT eval  At groin   30 cm proximal to suprapatella   20 cm proximal to suprapatella 54.5  10 cm proximal to suprapatella 46.3  At midpatella / popliteal crease 42.5  30 cm proximal to floor at lateral plantar foot 39.4  20 cm proximal to floor at lateral plantar foot 30  10 cm proximal to floor at lateral plantar foot 21.5  Circumference of ankle/heel   5 cm proximal to 1st MTP joint 20.1  Across MTP joint 22.9  Around proximal great toe 7.5  (Blank rows = not tested)  LOWER EXTREMITY LANDMARK LEFT eval  At groin   30 cm proximal to suprapatella   20 cm proximal to suprapatella 55.1  10 cm proximal to suprapatella 47.5  At midpatella / popliteal crease 41.5  30 cm proximal to floor at lateral plantar foot 39  20 cm proximal to floor at lateral plantar foot 28.6  10 cm proximal to floor at lateral plantar foot 22  Circumference of ankle/heel   5 cm proximal to 1st MTP joint 20.1  Across MTP joint 22.6  Around proximal great toe 7  (Blank rows = not tested)   TODAY'S TREATMENT:                                                                                                                                          DATE:  10/13/23: Suction cupping with cocoa butter to R inner thigh to improve mobility and decrease fibrosis, moving in all directions while pulling upwards IASTM to R inner thigh using a variety of methods including scraping and massage to help decrease fibrosis with increased softening noted by end of session and educated pt in proper way to use it  10/08/23: Suction cupping with cocoa butter to R inner thigh to improve mobility and decrease fibrosis, moving in all directions while pulling upwards IASTM to R inner thigh using a variety of methods including scraping and  massage to help decrease fibrosis with increased softening noted by end of session  10/06/23: Assessed progress towards goals Suction cupping with cocoa butter- pulled upwards moving in all directions to improve mobility  MFR to scar on inner R thigh to improve mobility STM to scar on inner R thigh to decrease fibrosis Brief MLD stretching skin towards the outer thigh   09/22/23: Suction cupping initially without cocoa butter to improve mobility to area of fibrosis on inner R thigh then added cocoa butter and pulled upwards moving in all directions to improve mobility  MFR to scar on inner R thigh to improve mobility STM to scar on inner R thigh to decrease fibrosis Educated pt to wear compression for about 4 hours today, all day the day of her flight tomorrow, then 4 hours the next day  09/17/23: Removed kinesiotape and assessed skin Manual lymph drainage in supine: short neck,  5 diaphragmatic breaths, right axillary nodes,  right inguino-axillary anastamoses, right lower extremity from groin moving to knee then retracing steps. Gentle MFR to area of scar on inner R thigh in all directions to help improve mobility Performed suction cupping to area of fibrosis at medial thigh using cocoa butter and suction cup moving cup in all directions while gently pulling upwards on cup with improved ability to move cup today Measured pt for thigh high stockings and educated pt to either order from [place] in order to get them here in time for her trip next Wedneday or go to [facility]: Medi Assure 20-53mmHg, Med Avg length or Juzo basic II avg length 20-74mmHg Created chip pack for pt to wear in leggings or bike shorts for additional compression Educated pt in availability of silicone scar tape to try  09/15/23: Gentle MFR to area of scar on inner R thigh in all directions to help improve mobility Performed suction cupping to area of fibrosis at medial thigh using cocoa butter and suction cup moving cup  in all directions while gently pulling upwards on cup Abbreviated MLD moving fluid from inner thigh to outer thigh while in sidelying then back to supine to move fluid up towards R axilla Kinesiotaping as follows: lattice work pattern with approx 1/4 in wide strips - pt reports she has used kinesiotape in the past with no issues or skin reactions  09/10/23: Manual lymph drainage in supine: short neck, right axillary nodes, 5 diaphragmatic breaths, right inguino-axillary anastamoses, L inginal nodes and anterior inguinal anastomosis, R inguinal nodes, right lower extremity from knee to hip moving from inner thigh to outer thigh then retracing all steps. Educated pt throughout and educated pt in proper technique including skin stretch and sequence and issued handout to patient to practice at home. Had pt demonstrate correct skin stretch technique.  Gentle MFR to area of scar on inner R thigh in all directions to help improve mobility During MLD performed suction cupping to area of fibrosis at medial thigh using cocoa butter and suction cup moving cup in all directions while gently pulling upwards on cup  09/08/23: Manual lymph drainage in supine: short neck, right axillary nodes, 5 diaphragmatic breaths, right inguino-axillary anastamoses, L inginal nodes and anterior inguinal anastomosis, R inguinal nodes, right lower extremity from knee to hip moving from inner thigh to outer thigh then retracing all steps During MLD performed suction cupping to area of fibrosis at medial thigh using cocoa butter and suction cup moving cup in all directions while gently pulling upwards on cup   08/18/23: none today -eval only due to time constraints   PATIENT EDUCATION:  Education details: MLD and how this helps fibrosis, cupping to help improve skin mobility Person educated: Patient Education method: Explanation Education comprehension: verbalized understanding  HOME EXERCISE PROGRAM: Self MLD for R LE with  focus on inner R thigh  ASSESSMENT:  CLINICAL IMPRESSION: Continued IASTM with the WAVE tool today using several different technique such as scraping and massage to continue to improve skin mobility and decrease fibrosis. Used this in addition to cupping. She continues to demonstrate decreased fibrosis by end of session. Instructed pt in correct way to use the wave tool since she ordered it for home use.   OBJECTIVE IMPAIRMENTS: difficulty walking, increased edema, increased fascial restrictions, impaired sensation, and pain.   ACTIVITY LIMITATIONS: sitting and sleeping  PARTICIPATION LIMITATIONS:  none  PERSONAL FACTORS: Time since onset of injury/illness/exacerbation are also affecting patient's functional outcome.  REHAB POTENTIAL: Good  CLINICAL DECISION MAKING: Stable/uncomplicated  EVALUATION COMPLEXITY: Low   GOALS: Goals reviewed with patient? Yes  SHORT TERM GOALS=LONG TERM GOALS Target date: 09/22/23  Pt will be independent in MLD for long term management of fibrosis and edema.  Baseline: Goal status: MET 10/06/23  2.  Pt will report a 75% improvement in pain and discomfort in medial R thigh to allow her to cross her legs without pain. Baseline:  Goal status: IN PROGRESS 25% better 10/06/23: up until her vacation there was less burning and stinging but has regressed since vacation  3.  Pt will obtain a compression garment to decrease risk of lymphedema and help decrease discomfort.  Baseline:  Goal status: MET 10/05/24  4.  Pt will have a 50% improvement in fibrosis in R medial thigh to allow improved comfort.  Baseline:  Goal status: IN PROGRESS 20% improved   PLAN:  PT FREQUENCY: 2x/week  PT DURATION: other: 4 weeks  PLANNED INTERVENTIONS: Therapeutic exercises, Therapeutic activity, Patient/Family education, Self Care, Orthotic/Fit training, Manual lymph drainage, Compression bandaging, scar mobilization, Taping, Vasopneumatic device, and Manual  therapy  PLAN FOR NEXT SESSION: continue MLD to R LE, suction cupping to R thigh, MFR   Hara Milholland Breedlove Blue, PT 10/13/2023, 2:51 PM

## 2023-10-14 ENCOUNTER — Ambulatory Visit: Payer: PPO | Admitting: Physical Therapy

## 2023-10-14 ENCOUNTER — Encounter: Payer: Self-pay | Admitting: Physical Therapy

## 2023-10-14 DIAGNOSIS — L599 Disorder of the skin and subcutaneous tissue related to radiation, unspecified: Secondary | ICD-10-CM | POA: Diagnosis not present

## 2023-10-14 DIAGNOSIS — Y842 Radiological procedure and radiotherapy as the cause of abnormal reaction of the patient, or of later complication, without mention of misadventure at the time of the procedure: Secondary | ICD-10-CM

## 2023-10-14 DIAGNOSIS — I890 Lymphedema, not elsewhere classified: Secondary | ICD-10-CM

## 2023-10-14 NOTE — Therapy (Addendum)
OUTPATIENT PHYSICAL THERAPY  LOWER EXTREMITY ONCOLOGY TREATMENT  Patient Name: Claire Wall MRN: [id number] DOB:[birth year], 72 y.o., female Today's Date: 10/14/2023  END OF SESSION:  PT End of Session - 10/14/23 1401     Visit Number 10    Number of Visits 11    Date for PT Re-Evaluation 11/03/23    PT Start Time 1400    PT Stop Time 1432    PT Time Calculation (min) 32 min    Activity Tolerance Patient tolerated treatment well    Behavior During Therapy [facility] for tasks assessed/performed             Past Medical History:  Diagnosis Date   Arthritis    knees, shoulder   Cancer (HCC) 08/2019   skin - melanoma right calf   Chest pain 2015   Resolved per patient - anxiety/stress related to work, now retired, no current problem    Hyperlipidemia    Hypertension    resolved per patient, no problems since patient retired, had a stressful job.   Pneumonia    x 1   Past Surgical History:  Procedure Laterality Date   APPENDECTOMY     bladder tack  2018   at [facility]   BREAST BIOPSY Left    COLONOSCOPY     polyps   ELBOW SURGERY Bilateral    x 2   FOOT SURGERY Left    bunion   MELANOMA EXCISION Right 09/18/2020   Procedure: WIDE LOCAL EXICISION WITH ADVANCEMENT FLAP CLOSURE RIGHT CALF MELANOMA;  Surgeon: Almond Lint, MD;  Location: [facility] OR;  Service: General;  Laterality: Right;   MELANOMA EXCISION WITH SENTINEL LYMPH NODE BIOPSY Right 10/13/2019   Procedure: WIDE LOCAL EXCISION WITH ADVANCEMENT FLAP CLOSURE AND SENTINEL LYMPH NODE BIOPSY;  Surgeon: Almond Lint, MD;  Location: [facility] OR;  Service: General;  Laterality: Right;   WISDOM TOOTH EXTRACTION     Patient Active Problem List   Diagnosis Date Noted   HTN (hypertension) 03/15/2014   Chest pain     PCP: Guerry Bruin, MD  REFERRING PROVIDER: Pixie Casino, MD  REFERRING DIAG:  Diagnosis L59.8 (ICD-10-CM) - Other specified disorders of the skin and subcutaneous tissue related to  radiation Y84.2 (ICD-10-CM) - Radiological procedure and radiotherapy as the cause of abnormal reaction of the patient, or of later complication, without mention of misadventure at the time of the procedure  THERAPY DIAG:  Disorder of the skin and subcutaneous tissue related to radiation, unspecified  Lymphedema, not elsewhere classified  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    Eval: I am in remission from melanoma. I had 3 radiation treatments with nano particles in my R thigh. The particles were supposed to decrease the number of radiation treatments I would need. It was supposed to make Keytruda more effective. Last year in the fall the place where I had radiation was hard and sore to touch. Sometimes it burns and stings. I discussed with [facility] about wether or not red light therapy or something would work. I had my PT work on massaging it. I see a PT for my back and my knees and my hip. The immuno therapy attacked my joints so it destroyed cartilage in my hip. I had to have a R hip replacement. It caused increased arthritis in knees and back. I also go to a massage therapist who has done some massaging to this place to loosen it up. My oncologist told me in May that other people in the trial were having the same problem as me and he prescribe pentoxifylline to help with the fibrosis and after 5 days I stopped it. I had back surgery 06/10/23. Tried medicine again 08/03/23 - week of and I felt achy again.   PERTINENT HISTORY: 07/2019 - R calf melanoma excision with SLNB inguinal 0/2, 08/2020 R calf melanoma excision, 11/2020 R  calf melanoma saw Dr. Azzie Almas Mochos in 12/2020 - had PET scan which showed melanoma in the calf did T-VEC injections every 2 weeks from 12/2020-04/2021, found a nodule in R inguinal area and inner thigh, went on immunotherapy 06/2021,  just had a biopsy to remove node in groin 1/1, was on immunotherapy for 6 weeks and the place in the medial thigh was growing and another node was swollen in the groin, 07/2021 had genetic testing, 08/12/21 did radiation to inner R thigh which is when the nano particles were used as part of study to reduce the number of radiation treatments needed and improve effectiveness of immunotherapy, had 3 radiation treatments, did immunotherapy until 05/2022 when she had to have a R hip replacement in 08/2022 and now is monitored with PET scans  PAIN:  Are you having pain? No   PRECAUTIONS: Other: R THA 08/2022, micro discetomy 04/2023  RED FLAGS: None   WEIGHT BEARING RESTRICTIONS: No  FALLS:  Has patient fallen in last 6 months? No  LIVING ENVIRONMENT: Lives with: lives with their spouse Lives in: House/apartment Stairs: Yes; does not need to use them frequently in the house Has following equipment at home: Grab bars  OCCUPATION: retired  LEISURE: stretching and general strengthening 4-5x/wk for 15-20 min, walks  PRIOR LEVEL OF FUNCTION: Independent  PATIENT GOALS: to see if we can break down the fibrosis I have on my inner thigh and see if it can look any better   OBJECTIVE:  COGNITION: Overall cognitive status: Within functional limits for tasks assessed   PALPATION: Tightness and fibrosis palpable at medial thigh  OBSERVATIONS / OTHER ASSESSMENTS: indented skin at medial thigh, redness that pt reports has been there since radiation   LYMPHEDEMA ASSESSMENTS:   SURGERY TYPE/DATE: 2 wide excision biopsies of R calf in 2020 and SLNB (0/2), lymph node dissection (1/1) 2022  NUMBER OF LYMPH NODES REMOVED: 2 in 2020 both negative, 1 in 2022 that was  positive  RADIATION:completed in 2022   LYMPHEDEMA ASSESSMENTS: will assess at next session, unable today due to time constraints  LOWER EXTREMITY LANDMARK RIGHT eval  At  groin   30 cm proximal to suprapatella   20 cm proximal to suprapatella 54.5  10 cm proximal to suprapatella 46.3  At midpatella / popliteal crease 42.5  30 cm proximal to floor at lateral plantar foot 39.4  20 cm proximal to floor at lateral plantar foot 30  10 cm proximal to floor at lateral plantar foot 21.5  Circumference of ankle/heel   5 cm proximal to 1st MTP joint 20.1  Across MTP joint 22.9  Around proximal great toe 7.5  (Blank rows = not tested)  LOWER EXTREMITY LANDMARK LEFT eval  At groin   30 cm proximal to suprapatella   20 cm proximal to suprapatella 55.1  10 cm proximal to suprapatella 47.5  At midpatella / popliteal crease 41.5  30 cm proximal to floor at lateral plantar foot 39  20 cm proximal to floor at lateral plantar foot 28.6  10 cm proximal to floor at lateral plantar foot 22  Circumference of ankle/heel   5 cm proximal to 1st MTP joint 20.1  Across MTP joint 22.6  Around proximal great toe 7  (Blank rows = not tested)   TODAY'S TREATMENT:                                                                                                                                          DATE:  10/14/23: Suction cupping with cocoa butter to R inner thigh to improve mobility and decrease fibrosis, moving in all directions while pulling upwards IASTM with cocoa butter to R inner thigh using a variety of methods including scraping and massage to help decrease fibrosis with increased softening noted by end of session STM to R inner thigh  10/13/23: Suction cupping with cocoa butter to R inner thigh to improve mobility and decrease fibrosis, moving in all directions while pulling upwards IASTM to R inner thigh using a variety of methods including scraping and massage to help decrease fibrosis  with increased softening noted by end of session and educated pt in proper way to use it  10/08/23: Suction cupping with cocoa butter to R inner thigh to improve mobility and decrease fibrosis, moving in all directions while pulling upwards IASTM to R inner thigh using a variety of methods including scraping and massage to help decrease fibrosis with increased softening noted by end of session  10/06/23: Assessed progress towards goals Suction cupping with cocoa butter- pulled upwards moving in all directions to improve mobility  MFR to scar on inner R thigh to improve mobility STM to scar on inner R thigh to decrease fibrosis Brief MLD stretching skin towards the outer thigh   09/22/23: Suction cupping initially without cocoa butter to improve mobility to area of fibrosis on inner R thigh then added cocoa butter and pulled upwards moving in all directions to improve mobility  MFR to scar on inner R thigh to improve mobility  STM to scar on inner R thigh to decrease fibrosis Educated pt to wear compression for about 4 hours today, all day the day of her flight tomorrow, then 4 hours the next day  09/17/23: Removed kinesiotape and assessed skin Manual lymph drainage in supine: short neck, 5 diaphragmatic breaths, right axillary nodes,  right inguino-axillary anastamoses, right lower extremity from groin moving to knee then retracing steps. Gentle MFR to area of scar on inner R thigh in all directions to help improve mobility Performed suction cupping to area of fibrosis at medial thigh using cocoa butter and suction cup moving cup in all directions while gently pulling upwards on cup with improved ability to move cup today Measured pt for thigh high stockings and educated pt to either order from [place] in order to get them here in time for her trip next Wedneday or go to [facility]: Medi Assure 20-5mmHg, Med Avg length or Juzo basic II avg length 20-10mmHg Created chip pack for pt to wear in  leggings or bike shorts for additional compression Educated pt in availability of silicone scar tape to try  09/15/23: Gentle MFR to area of scar on inner R thigh in all directions to help improve mobility Performed suction cupping to area of fibrosis at medial thigh using cocoa butter and suction cup moving cup in all directions while gently pulling upwards on cup Abbreviated MLD moving fluid from inner thigh to outer thigh while in sidelying then back to supine to move fluid up towards R axilla Kinesiotaping as follows: lattice work pattern with approx 1/4 in wide strips - pt reports she has used kinesiotape in the past with no issues or skin reactions  09/10/23: Manual lymph drainage in supine: short neck, right axillary nodes, 5 diaphragmatic breaths, right inguino-axillary anastamoses, L inginal nodes and anterior inguinal anastomosis, R inguinal nodes, right lower extremity from knee to hip moving from inner thigh to outer thigh then retracing all steps. Educated pt throughout and educated pt in proper technique including skin stretch and sequence and issued handout to patient to practice at home. Had pt demonstrate correct skin stretch technique.  Gentle MFR to area of scar on inner R thigh in all directions to help improve mobility During MLD performed suction cupping to area of fibrosis at medial thigh using cocoa butter and suction cup moving cup in all directions while gently pulling upwards on cup  09/08/23: Manual lymph drainage in supine: short neck, right axillary nodes, 5 diaphragmatic breaths, right inguino-axillary anastamoses, L inginal nodes and anterior inguinal anastomosis, R inguinal nodes, right lower extremity from knee to hip moving from inner thigh to outer thigh then retracing all steps During MLD performed suction cupping to area of fibrosis at medial thigh using cocoa butter and suction cup moving cup in all directions while gently pulling upwards on cup   08/18/23: none  today -eval only due to time constraints   PATIENT EDUCATION:  Education details: MLD and how this helps fibrosis, cupping to help improve skin mobility Person educated: Patient Education method: Explanation Education comprehension: verbalized understanding  HOME EXERCISE PROGRAM: Self MLD for R LE with focus on inner R thigh  ASSESSMENT:  CLINICAL IMPRESSION: Pt has now met all goals for therapy and will be discharged at this time. She has been instructed in soft tissue mobolization, self MLD and use of IASTM for management of area of fibrosis on R inner thigh. Her discomfort and fibrosis have improved greatly since she began therapy.  OBJECTIVE IMPAIRMENTS: difficulty walking, increased edema, increased fascial restrictions, impaired sensation, and pain.   ACTIVITY LIMITATIONS: sitting and sleeping  PARTICIPATION LIMITATIONS:  none  PERSONAL FACTORS: Time since onset of injury/illness/exacerbation are also affecting patient's functional outcome.   REHAB POTENTIAL: Good  CLINICAL DECISION MAKING: Stable/uncomplicated  EVALUATION COMPLEXITY: Low   GOALS: Goals reviewed with patient? Yes  SHORT TERM GOALS=LONG TERM GOALS Target date: 09/22/23  Pt will be independent in MLD for long term management of fibrosis and edema.  Baseline: Goal status: MET 10/06/23  2.  Pt will report a 75% improvement in pain and discomfort in medial R thigh to allow her to cross her legs without pain. Baseline:  Goal status: MET  10/14/23- 90% improvement when crossing legs and sitting with pressure on the area; 10/05/2424% better: up until her vacation there was less burning and stinging but has regressed since vacation  3.  Pt will obtain a compression garment to decrease risk of lymphedema and help decrease discomfort.  Baseline:  Goal status: MET 10/05/24  4.  Pt will have a 50% improvement in fibrosis in R medial thigh to allow improved comfort.  Baseline:  Goal status: MET 10/13/24-  65% improved 10/06/23-  20% improved   PLAN:  PT FREQUENCY: 2x/week  PT DURATION: other: 4 weeks  PLANNED INTERVENTIONS: Therapeutic exercises, Therapeutic activity, Patient/Family education, Self Care, Orthotic/Fit training, Manual lymph drainage, Compression bandaging, scar mobilization, Taping, Vasopneumatic device, and Manual therapy  PLAN FOR NEXT SESSION: D/C this visit, original cert and re cert faxed to Landry Dyke, MD - referring on 10/15/23, 2nd attempt to get signed   Leonette Most, PT 10/14/2023, 2:46 PM  PHYSICAL THERAPY DISCHARGE SUMMARY  Visits from Start of Care: 10  Current functional level related to goals / functional outcomes: All goals met   Remaining deficits: None   Education / Equipment: STM, self MLD, IASTM   Patient agrees to discharge. Patient goals were met. Patient is being discharged due to meeting the stated rehab goals.  Milagros Loll Hobson, [place] 10/14/23 2:49 PM

## 2023-10-15 DIAGNOSIS — C4371 Malignant melanoma of right lower limb, including hip: Secondary | ICD-10-CM | POA: Diagnosis not present

## 2023-10-15 DIAGNOSIS — R918 Other nonspecific abnormal finding of lung field: Secondary | ICD-10-CM | POA: Diagnosis not present

## 2023-10-15 DIAGNOSIS — M25651 Stiffness of right hip, not elsewhere classified: Secondary | ICD-10-CM | POA: Diagnosis not present

## 2023-10-15 DIAGNOSIS — Z85820 Personal history of malignant melanoma of skin: Secondary | ICD-10-CM | POA: Diagnosis not present

## 2023-10-15 DIAGNOSIS — Z006 Encounter for examination for normal comparison and control in clinical research program: Secondary | ICD-10-CM | POA: Diagnosis not present

## 2023-10-15 DIAGNOSIS — R262 Difficulty in walking, not elsewhere classified: Secondary | ICD-10-CM | POA: Diagnosis not present

## 2023-10-15 DIAGNOSIS — Z08 Encounter for follow-up examination after completed treatment for malignant neoplasm: Secondary | ICD-10-CM | POA: Diagnosis not present

## 2023-10-15 DIAGNOSIS — Z471 Aftercare following joint replacement surgery: Secondary | ICD-10-CM | POA: Diagnosis not present

## 2023-10-19 DIAGNOSIS — M545 Low back pain, unspecified: Secondary | ICD-10-CM | POA: Diagnosis not present

## 2023-10-19 DIAGNOSIS — R269 Unspecified abnormalities of gait and mobility: Secondary | ICD-10-CM | POA: Diagnosis not present

## 2023-10-19 DIAGNOSIS — M25562 Pain in left knee: Secondary | ICD-10-CM | POA: Diagnosis not present

## 2023-10-27 DIAGNOSIS — Z471 Aftercare following joint replacement surgery: Secondary | ICD-10-CM | POA: Diagnosis not present

## 2023-10-27 DIAGNOSIS — R262 Difficulty in walking, not elsewhere classified: Secondary | ICD-10-CM | POA: Diagnosis not present

## 2023-10-27 DIAGNOSIS — M25651 Stiffness of right hip, not elsewhere classified: Secondary | ICD-10-CM | POA: Diagnosis not present

## 2023-10-29 DIAGNOSIS — Z85820 Personal history of malignant melanoma of skin: Secondary | ICD-10-CM | POA: Diagnosis not present

## 2023-10-29 DIAGNOSIS — L812 Freckles: Secondary | ICD-10-CM | POA: Diagnosis not present

## 2023-10-29 DIAGNOSIS — L570 Actinic keratosis: Secondary | ICD-10-CM | POA: Diagnosis not present

## 2023-10-29 DIAGNOSIS — D2272 Melanocytic nevi of left lower limb, including hip: Secondary | ICD-10-CM | POA: Diagnosis not present

## 2023-10-29 DIAGNOSIS — D1801 Hemangioma of skin and subcutaneous tissue: Secondary | ICD-10-CM | POA: Diagnosis not present

## 2023-10-29 DIAGNOSIS — Z85828 Personal history of other malignant neoplasm of skin: Secondary | ICD-10-CM | POA: Diagnosis not present

## 2023-10-29 DIAGNOSIS — D2261 Melanocytic nevi of right upper limb, including shoulder: Secondary | ICD-10-CM | POA: Diagnosis not present

## 2023-10-29 DIAGNOSIS — L821 Other seborrheic keratosis: Secondary | ICD-10-CM | POA: Diagnosis not present

## 2023-10-29 DIAGNOSIS — D2271 Melanocytic nevi of right lower limb, including hip: Secondary | ICD-10-CM | POA: Diagnosis not present

## 2023-11-03 DIAGNOSIS — M25651 Stiffness of right hip, not elsewhere classified: Secondary | ICD-10-CM | POA: Diagnosis not present

## 2023-11-03 DIAGNOSIS — R262 Difficulty in walking, not elsewhere classified: Secondary | ICD-10-CM | POA: Diagnosis not present

## 2023-11-03 DIAGNOSIS — Z471 Aftercare following joint replacement surgery: Secondary | ICD-10-CM | POA: Diagnosis not present

## 2023-11-05 DIAGNOSIS — Z23 Encounter for immunization: Secondary | ICD-10-CM | POA: Diagnosis not present

## 2023-11-10 DIAGNOSIS — M25651 Stiffness of right hip, not elsewhere classified: Secondary | ICD-10-CM | POA: Diagnosis not present

## 2023-11-10 DIAGNOSIS — R262 Difficulty in walking, not elsewhere classified: Secondary | ICD-10-CM | POA: Diagnosis not present

## 2023-11-10 DIAGNOSIS — Z471 Aftercare following joint replacement surgery: Secondary | ICD-10-CM | POA: Diagnosis not present

## 2023-11-17 DIAGNOSIS — R262 Difficulty in walking, not elsewhere classified: Secondary | ICD-10-CM | POA: Diagnosis not present

## 2023-11-17 DIAGNOSIS — Z471 Aftercare following joint replacement surgery: Secondary | ICD-10-CM | POA: Diagnosis not present

## 2023-11-17 DIAGNOSIS — M25651 Stiffness of right hip, not elsewhere classified: Secondary | ICD-10-CM | POA: Diagnosis not present

## 2023-11-20 ENCOUNTER — Other Ambulatory Visit: Payer: Self-pay | Admitting: Medical Genetics

## 2023-11-23 DIAGNOSIS — J849 Interstitial pulmonary disease, unspecified: Secondary | ICD-10-CM | POA: Diagnosis not present

## 2023-11-23 DIAGNOSIS — R0602 Shortness of breath: Secondary | ICD-10-CM | POA: Diagnosis not present

## 2023-11-23 DIAGNOSIS — J984 Other disorders of lung: Secondary | ICD-10-CM | POA: Diagnosis not present

## 2023-11-24 DIAGNOSIS — Z471 Aftercare following joint replacement surgery: Secondary | ICD-10-CM | POA: Diagnosis not present

## 2023-11-24 DIAGNOSIS — R262 Difficulty in walking, not elsewhere classified: Secondary | ICD-10-CM | POA: Diagnosis not present

## 2023-11-24 DIAGNOSIS — M25651 Stiffness of right hip, not elsewhere classified: Secondary | ICD-10-CM | POA: Diagnosis not present

## 2023-11-25 ENCOUNTER — Other Ambulatory Visit: Payer: Self-pay | Admitting: Medical Genetics

## 2023-11-25 DIAGNOSIS — I10 Essential (primary) hypertension: Secondary | ICD-10-CM | POA: Diagnosis not present

## 2023-11-25 DIAGNOSIS — Z7982 Long term (current) use of aspirin: Secondary | ICD-10-CM | POA: Diagnosis not present

## 2023-11-25 DIAGNOSIS — I6523 Occlusion and stenosis of bilateral carotid arteries: Secondary | ICD-10-CM | POA: Diagnosis not present

## 2023-11-25 DIAGNOSIS — E7800 Pure hypercholesterolemia, unspecified: Secondary | ICD-10-CM | POA: Diagnosis not present

## 2023-11-25 DIAGNOSIS — J849 Interstitial pulmonary disease, unspecified: Secondary | ICD-10-CM | POA: Diagnosis not present

## 2023-11-30 DIAGNOSIS — M9904 Segmental and somatic dysfunction of sacral region: Secondary | ICD-10-CM | POA: Diagnosis not present

## 2023-11-30 DIAGNOSIS — M25561 Pain in right knee: Secondary | ICD-10-CM | POA: Diagnosis not present

## 2023-11-30 DIAGNOSIS — M9905 Segmental and somatic dysfunction of pelvic region: Secondary | ICD-10-CM | POA: Diagnosis not present

## 2023-11-30 DIAGNOSIS — M25562 Pain in left knee: Secondary | ICD-10-CM | POA: Diagnosis not present

## 2023-11-30 DIAGNOSIS — G4700 Insomnia, unspecified: Secondary | ICD-10-CM | POA: Diagnosis not present

## 2023-11-30 DIAGNOSIS — M9906 Segmental and somatic dysfunction of lower extremity: Secondary | ICD-10-CM | POA: Diagnosis not present

## 2023-11-30 DIAGNOSIS — M6281 Muscle weakness (generalized): Secondary | ICD-10-CM | POA: Diagnosis not present

## 2023-12-01 ENCOUNTER — Other Ambulatory Visit (HOSPITAL_COMMUNITY): Payer: Self-pay

## 2023-12-01 DIAGNOSIS — Z471 Aftercare following joint replacement surgery: Secondary | ICD-10-CM | POA: Diagnosis not present

## 2023-12-01 DIAGNOSIS — R262 Difficulty in walking, not elsewhere classified: Secondary | ICD-10-CM | POA: Diagnosis not present

## 2023-12-01 DIAGNOSIS — M25651 Stiffness of right hip, not elsewhere classified: Secondary | ICD-10-CM | POA: Diagnosis not present

## 2023-12-01 MED ORDER — ROSUVASTATIN CALCIUM 40 MG PO TABS
40.0000 mg | ORAL_TABLET | Freq: Every day | ORAL | 3 refills | Status: DC
  Filled 2023-12-01: qty 90, 90d supply, fill #0
  Filled 2024-02-21: qty 90, 90d supply, fill #1
  Filled 2024-05-17: qty 90, 90d supply, fill #2
  Filled 2024-08-23: qty 90, 90d supply, fill #3

## 2023-12-03 ENCOUNTER — Other Ambulatory Visit (HOSPITAL_COMMUNITY): Payer: Self-pay

## 2023-12-08 DIAGNOSIS — M25651 Stiffness of right hip, not elsewhere classified: Secondary | ICD-10-CM | POA: Diagnosis not present

## 2023-12-08 DIAGNOSIS — R262 Difficulty in walking, not elsewhere classified: Secondary | ICD-10-CM | POA: Diagnosis not present

## 2023-12-08 DIAGNOSIS — Z471 Aftercare following joint replacement surgery: Secondary | ICD-10-CM | POA: Diagnosis not present

## 2023-12-10 DIAGNOSIS — M533 Sacrococcygeal disorders, not elsewhere classified: Secondary | ICD-10-CM | POA: Diagnosis not present

## 2023-12-10 DIAGNOSIS — M47816 Spondylosis without myelopathy or radiculopathy, lumbar region: Secondary | ICD-10-CM | POA: Diagnosis not present

## 2023-12-14 DIAGNOSIS — R262 Difficulty in walking, not elsewhere classified: Secondary | ICD-10-CM | POA: Diagnosis not present

## 2023-12-14 DIAGNOSIS — M25651 Stiffness of right hip, not elsewhere classified: Secondary | ICD-10-CM | POA: Diagnosis not present

## 2023-12-14 DIAGNOSIS — Z471 Aftercare following joint replacement surgery: Secondary | ICD-10-CM | POA: Diagnosis not present

## 2023-12-22 DIAGNOSIS — M25651 Stiffness of right hip, not elsewhere classified: Secondary | ICD-10-CM | POA: Diagnosis not present

## 2023-12-22 DIAGNOSIS — R262 Difficulty in walking, not elsewhere classified: Secondary | ICD-10-CM | POA: Diagnosis not present

## 2023-12-22 DIAGNOSIS — Z471 Aftercare following joint replacement surgery: Secondary | ICD-10-CM | POA: Diagnosis not present

## 2023-12-29 ENCOUNTER — Telehealth: Payer: Self-pay | Admitting: Internal Medicine

## 2023-12-29 NOTE — Telephone Encounter (Signed)
 Copied from CRM [id number]. Topic: General - Other >> Dec 29, 2023  1:23 PM Melissa C wrote: Reason for CRM: patient states she was referred to brett jac[facility] and that is incorrect, she was supposed to be referred to and scheduled with Dr. Artist Lloyd. Please advise with patient. Thank you

## 2023-12-30 ENCOUNTER — Other Ambulatory Visit (HOSPITAL_COMMUNITY)
Admission: RE | Admit: 2023-12-30 | Discharge: 2023-12-30 | Disposition: A | Discharge status: Home / Self Care | Payer: Self-pay | Source: Ambulatory Visit | Attending: Oncology | Admitting: Oncology

## 2023-12-30 ENCOUNTER — Other Ambulatory Visit (HOSPITAL_COMMUNITY): Payer: PPO

## 2023-12-31 ENCOUNTER — Ambulatory Visit: Payer: PPO | Admitting: Family Medicine

## 2024-01-04 ENCOUNTER — Other Ambulatory Visit (HOSPITAL_COMMUNITY): Payer: PPO

## 2024-01-04 ENCOUNTER — Ambulatory Visit (INDEPENDENT_AMBULATORY_CARE_PROVIDER_SITE_OTHER): Payer: PPO

## 2024-01-04 ENCOUNTER — Other Ambulatory Visit: Payer: Self-pay

## 2024-01-04 ENCOUNTER — Ambulatory Visit: Payer: PPO | Admitting: Family Medicine

## 2024-01-04 VITALS — BP 132/84 | HR 78 | Ht 70.0 in | Wt 177.0 lb

## 2024-01-04 DIAGNOSIS — M170 Bilateral primary osteoarthritis of knee: Secondary | ICD-10-CM

## 2024-01-04 DIAGNOSIS — M1711 Unilateral primary osteoarthritis, right knee: Secondary | ICD-10-CM | POA: Diagnosis not present

## 2024-01-04 DIAGNOSIS — M51369 Other intervertebral disc degeneration, lumbar region without mention of lumbar back pain or lower extremity pain: Secondary | ICD-10-CM | POA: Insufficient documentation

## 2024-01-04 DIAGNOSIS — M25651 Stiffness of right hip, not elsewhere classified: Secondary | ICD-10-CM | POA: Diagnosis not present

## 2024-01-04 DIAGNOSIS — Z471 Aftercare following joint replacement surgery: Secondary | ICD-10-CM | POA: Diagnosis not present

## 2024-01-04 DIAGNOSIS — M25562 Pain in left knee: Secondary | ICD-10-CM | POA: Diagnosis not present

## 2024-01-04 DIAGNOSIS — M48061 Spinal stenosis, lumbar region without neurogenic claudication: Secondary | ICD-10-CM | POA: Insufficient documentation

## 2024-01-04 DIAGNOSIS — Z7409 Other reduced mobility: Secondary | ICD-10-CM | POA: Insufficient documentation

## 2024-01-04 DIAGNOSIS — M25561 Pain in right knee: Secondary | ICD-10-CM | POA: Diagnosis not present

## 2024-01-04 DIAGNOSIS — G8929 Other chronic pain: Secondary | ICD-10-CM

## 2024-01-04 DIAGNOSIS — M533 Sacrococcygeal disorders, not elsewhere classified: Secondary | ICD-10-CM | POA: Insufficient documentation

## 2024-01-04 DIAGNOSIS — M5416 Radiculopathy, lumbar region: Secondary | ICD-10-CM | POA: Insufficient documentation

## 2024-01-04 DIAGNOSIS — R262 Difficulty in walking, not elsewhere classified: Secondary | ICD-10-CM | POA: Diagnosis not present

## 2024-01-04 NOTE — Patient Instructions (Addendum)
 Thank you for coming in today.   We will work on authorization of zilretta for both knees.   Please get an Xray today before you leave   Anticipate injection as soon as Jan 21. Keep me updated.

## 2024-01-04 NOTE — Progress Notes (Signed)
 [name], PhD, LAT, ATC acting as a scribe for Artist Lloyd, MD.  Claire Wall is a 73 y.o. female who presents to [facility] at [facility] today for bilat knee pain. Pt is a former pt of Dr. Marquette. Last L knee Zilretta  injection was 10/19/23. R knee has never been injected. No swelling. She notes prior dx of bone spurs and OA.   Dx testing: 03/12/23 L LE vasc US   06/02/22 L knee MRI  Pertinent review of systems: No fevers or chills  Relevant historical information: History of melanoma She is leaving for a trip to [place] on February 8  Exam:  BP 132/84   Pulse 78   Ht 5' 10 (1.778 m)   Wt 177 lb (80.3 kg)   LMP  (LMP Unknown)   SpO2 94%   BMI 25.40 kg/m  General: Well Developed, well nourished, and in no acute distress.   MSK: Knees bilaterally normal-appearing normal motion with crepitation tender palpation medial joint line.  Stable ligamentous exam.    Lab and Radiology Results  X-ray images right knee obtained today personally and independently interpreted Mild medial and moderate patellofemoral DJD. Await formal radiology review   EXAM: MRI OF THE LEFT KNEE WITHOUT CONTRAST   TECHNIQUE: Multiplanar, multisequence MR imaging of the knee was performed. No intravenous contrast was administered.   COMPARISON:  None Available.   FINDINGS: MENISCI   Medial meniscus: Intact. There is some intrasubstance degenerative signal in the posterior horn.   Lateral meniscus: There is complex tearing in the posterior horn including a radial component along the free edge at and just peripheral to the meniscal root. Rogelio is seen along the free edge of the body.   LIGAMENTS   Cruciates:  Intact.   Collaterals: Intact. Mild intrasubstance increased T2 signal and thickening are seen in the superior fibers of the MCL.   CARTILAGE   Patellofemoral:  Moderately thinned throughout without focal defect.   Medial:  Mildly to moderately  degenerated.   Lateral:  Moderately degenerated.   Joint:  Small joint effusion.   Popliteal Fossa:  Very small Baker's cyst.   Extensor Mechanism:  Intact.   Bones: No fracture, stress change or worrisome lesion. Osteophytosis is seen about the knee.   Other: None.   IMPRESSION: Complex tear posterior horn of the lateral meniscus as described. Fraying free edge of the body of the lateral meniscus noted.   Mild-to-moderate appearing tricompartmental osteoarthritis, worst laterally.   Mild sprain of the superior fibers of the MCL without tear is likely due to altered mechanics.   Very small Baker's cyst.     Electronically Signed   By: Debby Prader M.D.   On: 06/04/2022 08:47   I, Artist Lloyd, personally (independently) visualized and performed the interpretation of the images attached in this note.   Assessment and Plan: 73 y.o. female with chronic bilateral knee pain due to exacerbation of DJD.  She has done well historically with Zilretta  injections.  Plan on repeat Zilretta  injection in the near future.  12 weeks from her most recent Zilretta  injection would be Monday the 20th so she could return on or after Tuesday the 21st for Zilretta  injections once we get authorized.   PDMP not reviewed this encounter. Orders Placed This Encounter  Procedures   DG Knee AP/LAT W/Sunrise Right    Standing Status:   Future    Number of Occurrences:   1    Expiration Date:  01/03/2025    Reason for Exam (SYMPTOM  OR DIAGNOSIS REQUIRED):   eval knee pain    Preferred imaging location?:   [facility]   No orders of the defined types were placed in this encounter.    Discussed warning signs or symptoms. Please see discharge instructions. Patient expresses understanding.   The above documentation has been reviewed and is accurate and complete Artist Lloyd, M.D.

## 2024-01-05 DIAGNOSIS — M533 Sacrococcygeal disorders, not elsewhere classified: Secondary | ICD-10-CM | POA: Diagnosis not present

## 2024-01-06 ENCOUNTER — Telehealth: Payer: Self-pay

## 2024-01-06 NOTE — Telephone Encounter (Signed)
-----   Message from Claire Wall sent at 01/04/2024  1:18 PM EST ----- Regarding: Zilretta  Please auth Zilretta  both knees. Last Zilretta  left knee at [facility]

## 2024-01-10 ENCOUNTER — Encounter: Payer: Self-pay | Admitting: Family Medicine

## 2024-01-11 ENCOUNTER — Encounter: Payer: Self-pay | Admitting: Family Medicine

## 2024-01-11 DIAGNOSIS — I6523 Occlusion and stenosis of bilateral carotid arteries: Secondary | ICD-10-CM | POA: Diagnosis not present

## 2024-01-11 DIAGNOSIS — I700 Atherosclerosis of aorta: Secondary | ICD-10-CM | POA: Diagnosis not present

## 2024-01-11 DIAGNOSIS — I10 Essential (primary) hypertension: Secondary | ICD-10-CM | POA: Diagnosis not present

## 2024-01-11 DIAGNOSIS — M8580 Other specified disorders of bone density and structure, unspecified site: Secondary | ICD-10-CM | POA: Diagnosis not present

## 2024-01-11 DIAGNOSIS — R7989 Other specified abnormal findings of blood chemistry: Secondary | ICD-10-CM | POA: Diagnosis not present

## 2024-01-11 DIAGNOSIS — M792 Neuralgia and neuritis, unspecified: Secondary | ICD-10-CM | POA: Diagnosis not present

## 2024-01-11 DIAGNOSIS — E7800 Pure hypercholesterolemia, unspecified: Secondary | ICD-10-CM | POA: Diagnosis not present

## 2024-01-11 DIAGNOSIS — M545 Low back pain, unspecified: Secondary | ICD-10-CM | POA: Diagnosis not present

## 2024-01-11 DIAGNOSIS — C4371 Malignant melanoma of right lower limb, including hip: Secondary | ICD-10-CM | POA: Diagnosis not present

## 2024-01-11 LAB — GENECONNECT MOLECULAR SCREEN: Genetic Analysis Overall Interpretation: NEGATIVE

## 2024-01-11 NOTE — Progress Notes (Signed)
Right knee x-ray shows medium arthritis

## 2024-01-11 NOTE — Telephone Encounter (Signed)
VOB initiated for Zilretta for BILAT knee OA 

## 2024-01-12 DIAGNOSIS — M25651 Stiffness of right hip, not elsewhere classified: Secondary | ICD-10-CM | POA: Diagnosis not present

## 2024-01-12 DIAGNOSIS — R262 Difficulty in walking, not elsewhere classified: Secondary | ICD-10-CM | POA: Diagnosis not present

## 2024-01-12 DIAGNOSIS — Z471 Aftercare following joint replacement surgery: Secondary | ICD-10-CM | POA: Diagnosis not present

## 2024-01-13 NOTE — Telephone Encounter (Signed)
Scheduled

## 2024-01-13 NOTE — Telephone Encounter (Signed)
ZILRETTA for BILAT knee OA   [facility]  Primary Insurance: [employer] Adv Medicare Adv Co-pay: $20 Co-insurance: 20% Deductible: does not apply Prior Auth: NOT required   Knee Injection History 10/19/23 - Zilretta @ [facility]

## 2024-01-13 NOTE — Telephone Encounter (Signed)
[facility] St[facility] - Prior Authorization NOT required  Pharmacy - [facility] - Prior Authorization REQUIRED

## 2024-01-14 ENCOUNTER — Ambulatory Visit: Payer: PPO | Admitting: Family Medicine

## 2024-01-14 ENCOUNTER — Other Ambulatory Visit: Payer: Self-pay

## 2024-01-14 DIAGNOSIS — M170 Bilateral primary osteoarthritis of knee: Secondary | ICD-10-CM

## 2024-01-14 DIAGNOSIS — M25562 Pain in left knee: Secondary | ICD-10-CM

## 2024-01-14 DIAGNOSIS — M25561 Pain in right knee: Secondary | ICD-10-CM

## 2024-01-14 DIAGNOSIS — G8929 Other chronic pain: Secondary | ICD-10-CM | POA: Diagnosis not present

## 2024-01-14 MED ORDER — TRIAMCINOLONE ACETONIDE 32 MG IX SRER
32.0000 mg | Freq: Once | INTRA_ARTICULAR | Status: AC
Start: 2024-01-14 — End: 2024-01-14
  Administered 2024-01-14: 32 mg via INTRA_ARTICULAR

## 2024-01-14 NOTE — Telephone Encounter (Signed)
Pt received Zilretta injections for BILAT knee OA on 01/14/24. Can consider repeat injections on or after 04/07/24.

## 2024-01-14 NOTE — Progress Notes (Signed)
  Zilretta injection bilateral knee Procedure: Real-time Ultrasound Guided Injection of right knee joint superior lateral patellar space Device: Philips Affiniti 50G Images permanently stored and available for review in PACS Verbal informed consent obtained.  Discussed risks and benefits of procedure. Warned about infection, hyperglycemia bleeding, damage to structures among others. Patient expresses understanding and agreement Time-out conducted.   Noted no overlying erythema, induration, or other signs of local infection.   Skin prepped in a sterile fashion.   Local anesthesia: Topical Ethyl chloride.   With sterile technique and under real time ultrasound guidance: Zilretta 32 mg injected into knee joint. Fluid seen entering the joint capsule.   Completed without difficulty   Advised to call if fevers/chills, erythema, induration, drainage, or persistent bleeding.   Images permanently stored and available for review in the ultrasound unit.  Impression: Technically successful ultrasound guided injection.   Procedure: Real-time Ultrasound Guided Injection of left knee joint superior lateral patellar space Device: Philips Affiniti 50G Images permanently stored and available for review in PACS Verbal informed consent obtained.  Discussed risks and benefits of procedure. Warned about infection, hyperglycemia bleeding, damage to structures among others. Patient expresses understanding and agreement Time-out conducted.   Noted no overlying erythema, induration, or other signs of local infection.   Skin prepped in a sterile fashion.   Local anesthesia: Topical Ethyl chloride.   With sterile technique and under real time ultrasound guidance: Zilretta 32 mg injected into knee joint. Fluid seen entering the joint capsule.   Completed without difficulty   Advised to call if fevers/chills, erythema, induration, drainage, or persistent bleeding.   Images permanently stored and available for review  in the ultrasound unit.  Impression: Technically successful ultrasound guided injection.  Lot number: [id number] for both injections  Patient has a moderate Baker's cyst left posterior knee.  If not improving with the Zilretta injection interarticular she can return as early as 1 week for aspiration and injection if needed.  Recommend compression sleeve.

## 2024-01-14 NOTE — Patient Instructions (Addendum)
Thank you for coming in today.   You received an injection today. Seek immediate medical attention if the joint becomes red, extremely painful, or is oozing fluid.   Wear the compression sleeve  Let's give it a week and if not better schedule for a visit to aspirate and inject the Baker's cyst  Let us know about a week prior, if you want to repeat the Zilretta injections

## 2024-01-18 DIAGNOSIS — R262 Difficulty in walking, not elsewhere classified: Secondary | ICD-10-CM | POA: Diagnosis not present

## 2024-01-18 DIAGNOSIS — M25651 Stiffness of right hip, not elsewhere classified: Secondary | ICD-10-CM | POA: Diagnosis not present

## 2024-01-18 DIAGNOSIS — Z471 Aftercare following joint replacement surgery: Secondary | ICD-10-CM | POA: Diagnosis not present

## 2024-01-19 IMAGING — MR MR KNEE*L* W/O CM
7 series · 40 of 40 positions shown · non-contrast
Comparison: None Available.

CLINICAL DATA: Left knee pain. No known injury. History of Baker's
cyst.

EXAM:
MRI OF THE LEFT KNEE WITHOUT CONTRAST
TECHNIQUE: Multiplanar, multisequence MR imaging of the knee was performed. No
intravenous contrast was administered.

[Series 6: T2 fat-sat · axial · right · 4.0mm · 0.50mm/px · z∈[-106,+47]mm · 7 of 36 slices shown (1 of 3)]
[im 1/36]
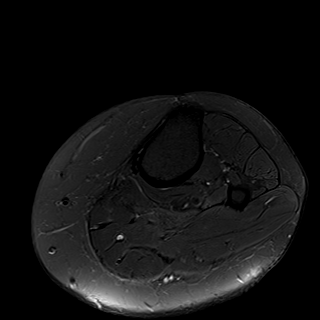
[im 6/36]
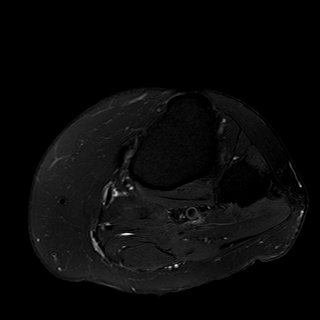
[im 12/36]
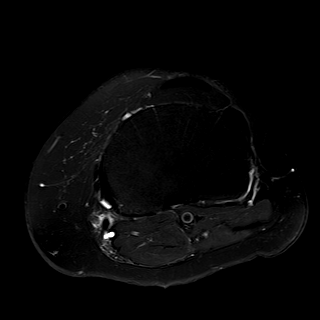
[im 18/36]
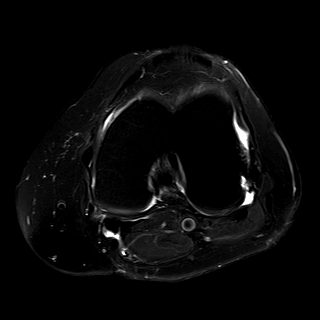
[im 24/36]
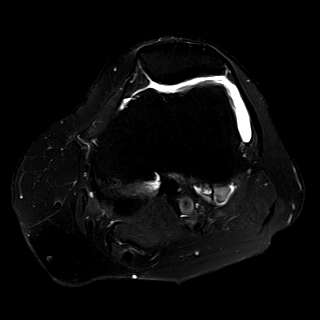
[im 30/36]
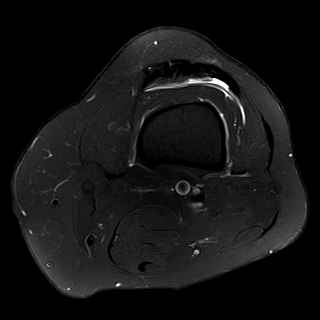
[im 36/36]
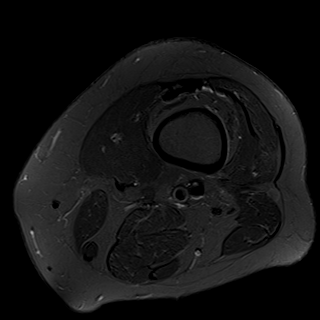

[Series 7: T2 fat-sat · coronal · right · 4.0mm · 0.47mm/px · 6 of 28 slices shown (2 of 3)]
[im 1/28]
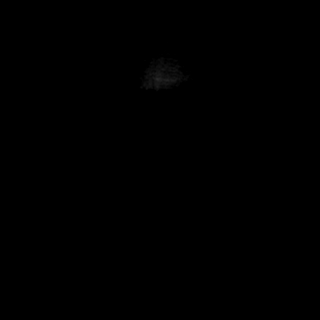
[im 6/28]
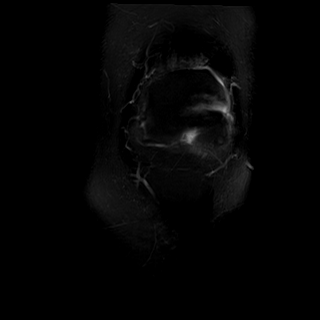
[im 11/28]
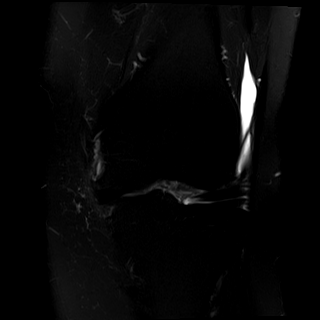
[im 17/28]
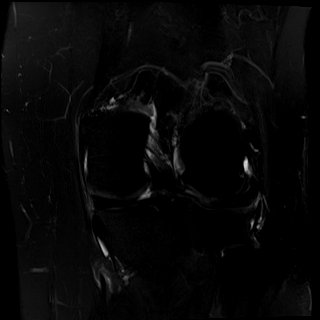
[im 22/28]
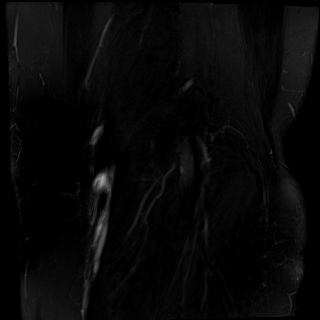
[im 28/28]
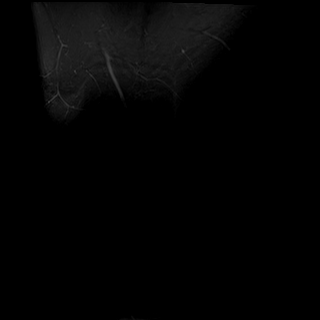

[Series 8: T1 · coronal · right · 4.0mm · 0.47mm/px · 6 of 28 slices shown]
[im 1/28]
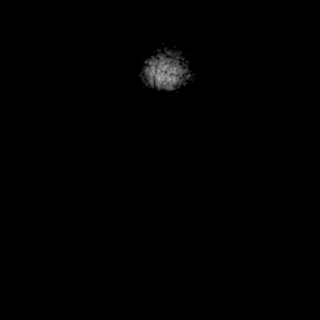
[im 6/28]
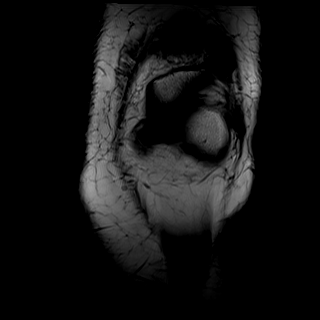
[im 11/28]
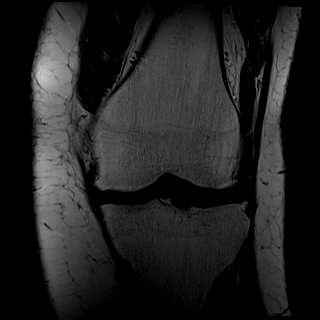
[im 17/28]
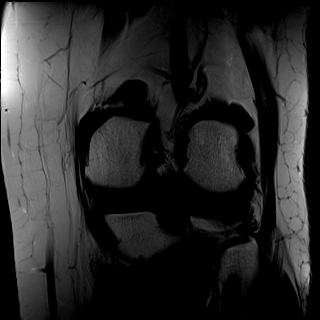
[im 22/28]
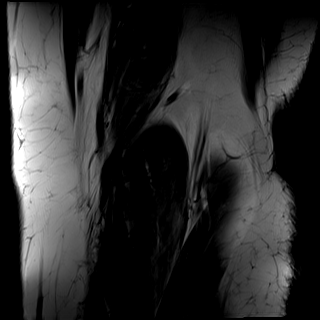
[im 28/28]
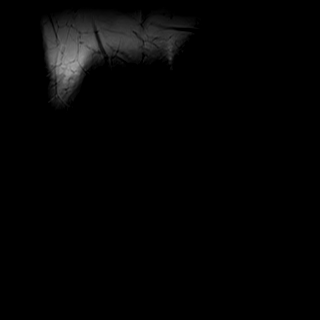

[Series 9: PD fat-sat · coronal · right · 3.0mm · 0.47mm/px · 7 of 34 slices shown (1 of 2)]
[im 1/34]
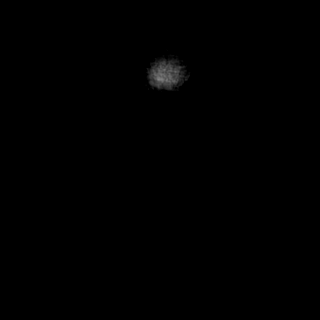
[im 6/34]
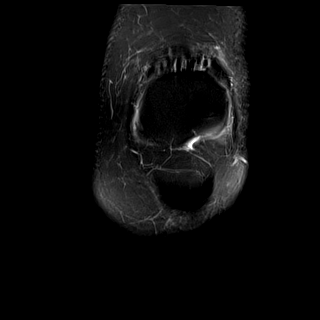
[im 12/34]
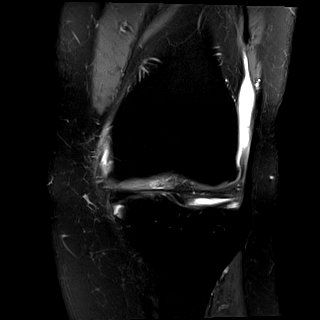
[im 17/34]
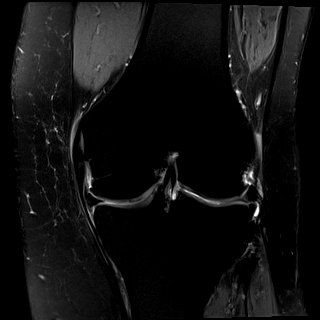
[im 23/34]
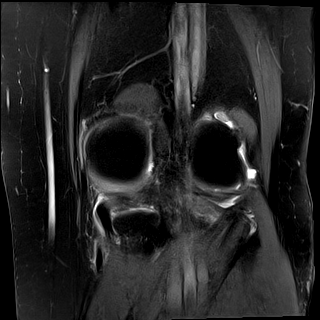
[im 28/34]
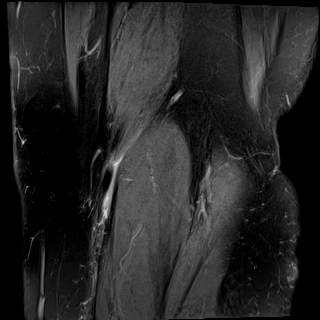
[im 34/34]
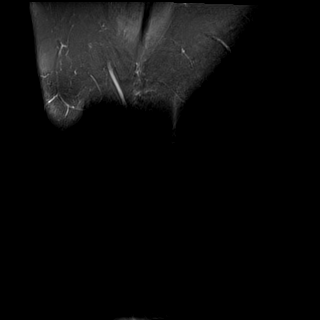

[Series 10: PD fat-sat · sagittal · right · 3.0mm · 0.39mm/px · 5 of 27 slices shown (2 of 2)]
[im 1/27]
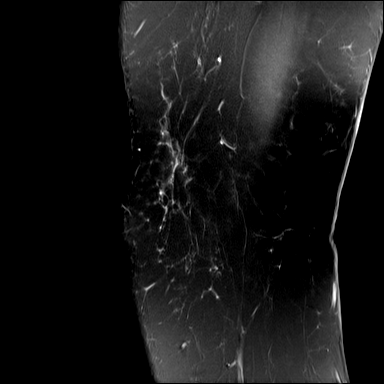
[im 7/27]
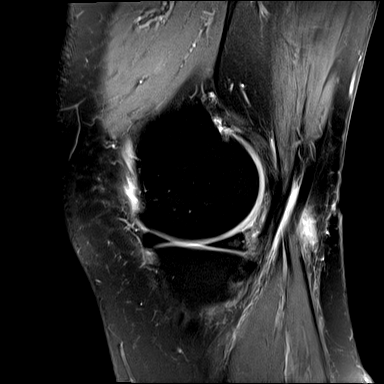
[im 14/27]
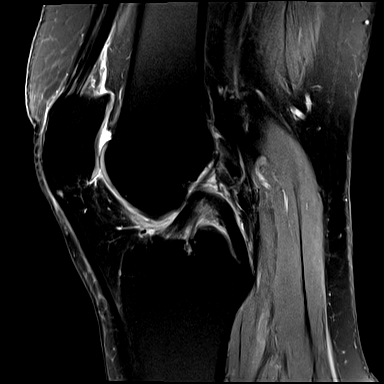
[im 20/27]
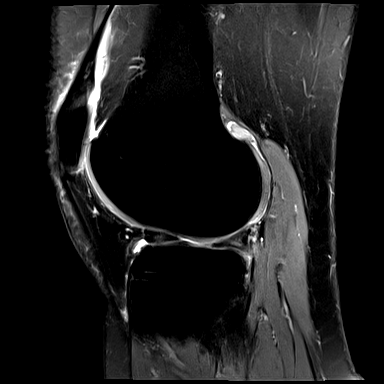
[im 27/27]
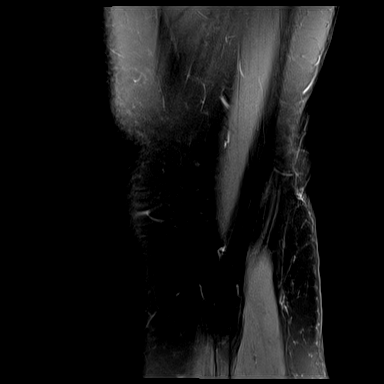

[Series 11: T2 fat-sat · sagittal · right · 3.0mm · 0.39mm/px · 5 of 27 slices shown (3 of 3)]
[im 1/27]
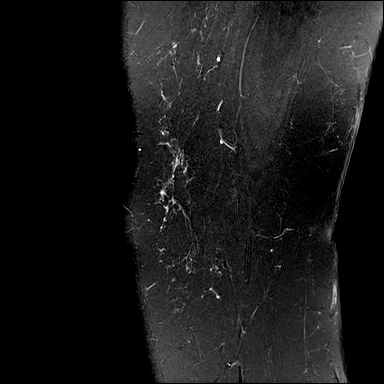
[im 7/27]
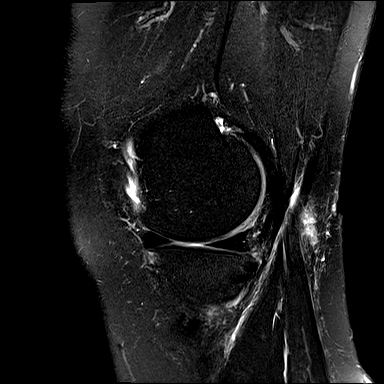
[im 14/27]
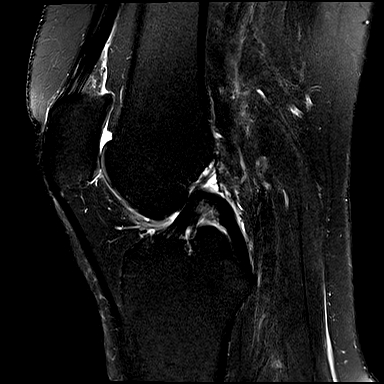
[im 20/27]
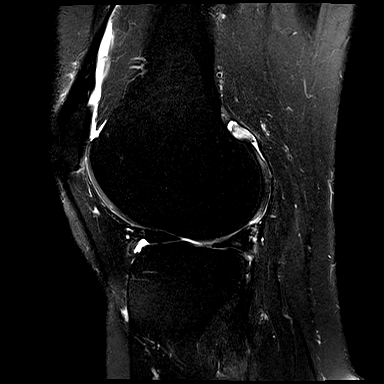
[im 27/27]
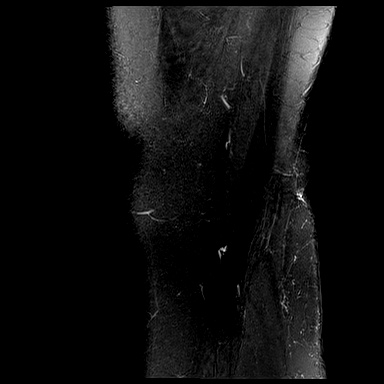

[Series 12: PD · coronal · right · 1.5mm · 0.44mm/px · 4 of 21 slices shown]
[im 1/21]
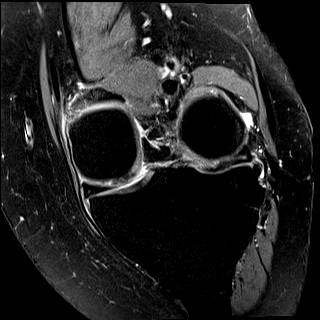
[im 7/21]
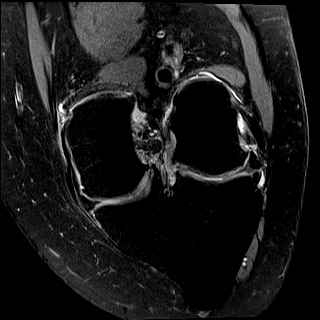
[im 14/21]
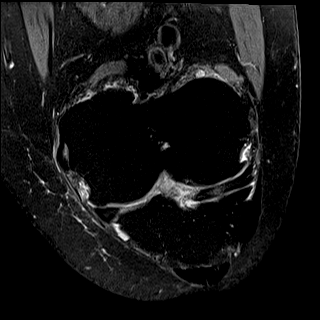
[im 21/21]
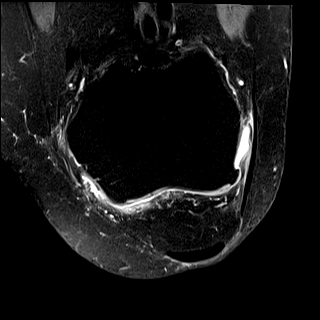

[40 of 40 positions shown; findings below may reference images not displayed]

FINDINGS: MENISCI

Medial meniscus: Intact. There is some intrasubstance degenerative
signal in the posterior horn.

Lateral meniscus: There is complex tearing in the posterior horn
including a radial component along the free edge at and just
peripheral to the meniscal root. Fraying is seen along the free edge
of the body.

LIGAMENTS

Cruciates:  Intact.

Collaterals: Intact. Mild intrasubstance increased T2 signal and
thickening are seen in the superior fibers of the MCL.

CARTILAGE

Patellofemoral:  Moderately thinned throughout without focal defect.

Medial:  Mildly to moderately degenerated.

Lateral:  Moderately degenerated.

Joint:  Small joint effusion.

Popliteal Fossa:  Very small Baker's cyst.

Extensor Mechanism:  Intact.

Bones: No fracture, stress change or worrisome lesion. Osteophytosis
is seen about the knee.

Other: None.
IMPRESSION: Complex tear posterior horn of the lateral meniscus as described.
Fraying free edge of the body of the lateral meniscus noted.

Mild-to-moderate appearing tricompartmental osteoarthritis, worst
laterally.

Mild sprain of the superior fibers of the MCL without tear is likely
due to altered mechanics.

Very small Baker's cyst.

## 2024-01-21 ENCOUNTER — Other Ambulatory Visit (HOSPITAL_COMMUNITY): Payer: Self-pay

## 2024-01-21 DIAGNOSIS — M47816 Spondylosis without myelopathy or radiculopathy, lumbar region: Secondary | ICD-10-CM | POA: Diagnosis not present

## 2024-01-21 MED ORDER — MELOXICAM 15 MG PO TABS
15.0000 mg | ORAL_TABLET | Freq: Every day | ORAL | 2 refills | Status: DC | PRN
  Filled 2024-01-21 (×2): qty 30, 30d supply, fill #0
  Filled 2024-02-21: qty 30, 30d supply, fill #1
  Filled 2024-03-27: qty 30, 30d supply, fill #2

## 2024-01-25 DIAGNOSIS — M545 Low back pain, unspecified: Secondary | ICD-10-CM | POA: Diagnosis not present

## 2024-01-25 DIAGNOSIS — S335XXD Sprain of ligaments of lumbar spine, subsequent encounter: Secondary | ICD-10-CM | POA: Diagnosis not present

## 2024-02-03 ENCOUNTER — Other Ambulatory Visit (HOSPITAL_COMMUNITY): Payer: Self-pay

## 2024-02-04 DIAGNOSIS — Z471 Aftercare following joint replacement surgery: Secondary | ICD-10-CM | POA: Diagnosis not present

## 2024-02-04 DIAGNOSIS — M4316 Spondylolisthesis, lumbar region: Secondary | ICD-10-CM | POA: Diagnosis not present

## 2024-02-08 DIAGNOSIS — Z85828 Personal history of other malignant neoplasm of skin: Secondary | ICD-10-CM | POA: Diagnosis not present

## 2024-02-08 DIAGNOSIS — Z85820 Personal history of malignant melanoma of skin: Secondary | ICD-10-CM | POA: Diagnosis not present

## 2024-02-08 DIAGNOSIS — D692 Other nonthrombocytopenic purpura: Secondary | ICD-10-CM | POA: Diagnosis not present

## 2024-02-09 DIAGNOSIS — M4316 Spondylolisthesis, lumbar region: Secondary | ICD-10-CM | POA: Diagnosis not present

## 2024-02-09 DIAGNOSIS — Z471 Aftercare following joint replacement surgery: Secondary | ICD-10-CM | POA: Diagnosis not present

## 2024-02-15 DIAGNOSIS — M4316 Spondylolisthesis, lumbar region: Secondary | ICD-10-CM | POA: Diagnosis not present

## 2024-02-15 DIAGNOSIS — Z471 Aftercare following joint replacement surgery: Secondary | ICD-10-CM | POA: Diagnosis not present

## 2024-02-22 DIAGNOSIS — M4316 Spondylolisthesis, lumbar region: Secondary | ICD-10-CM | POA: Diagnosis not present

## 2024-02-22 DIAGNOSIS — Z471 Aftercare following joint replacement surgery: Secondary | ICD-10-CM | POA: Diagnosis not present

## 2024-02-29 DIAGNOSIS — Z85828 Personal history of other malignant neoplasm of skin: Secondary | ICD-10-CM | POA: Diagnosis not present

## 2024-02-29 DIAGNOSIS — D2272 Melanocytic nevi of left lower limb, including hip: Secondary | ICD-10-CM | POA: Diagnosis not present

## 2024-02-29 DIAGNOSIS — L812 Freckles: Secondary | ICD-10-CM | POA: Diagnosis not present

## 2024-02-29 DIAGNOSIS — Z85820 Personal history of malignant melanoma of skin: Secondary | ICD-10-CM | POA: Diagnosis not present

## 2024-02-29 DIAGNOSIS — D2271 Melanocytic nevi of right lower limb, including hip: Secondary | ICD-10-CM | POA: Diagnosis not present

## 2024-02-29 DIAGNOSIS — L821 Other seborrheic keratosis: Secondary | ICD-10-CM | POA: Diagnosis not present

## 2024-03-01 DIAGNOSIS — Z471 Aftercare following joint replacement surgery: Secondary | ICD-10-CM | POA: Diagnosis not present

## 2024-03-01 DIAGNOSIS — M4316 Spondylolisthesis, lumbar region: Secondary | ICD-10-CM | POA: Diagnosis not present

## 2024-03-02 ENCOUNTER — Telehealth: Payer: Self-pay | Admitting: Family Medicine

## 2024-03-02 NOTE — Telephone Encounter (Signed)
 Patient scheduled for bilateral Zilretta injections through MyChart on 04/12/2024.  Last given 01/14/24.  Can you confirm coverage please?

## 2024-03-03 ENCOUNTER — Telehealth: Payer: Self-pay

## 2024-03-03 DIAGNOSIS — M4316 Spondylolisthesis, lumbar region: Secondary | ICD-10-CM | POA: Diagnosis not present

## 2024-03-03 DIAGNOSIS — Z471 Aftercare following joint replacement surgery: Secondary | ICD-10-CM | POA: Diagnosis not present

## 2024-03-03 NOTE — Telephone Encounter (Signed)
**  Patient scheduled for 04/14/24**  Zilretta authorized for bilateral knee No PA, medical notes, or referrals needed Patients responsibility for J3304 will be 20% with the remaining covered at 80%  Patient is responsible for $20 copay for CPT code 20611with remaining covered at 100% Deductible does not apply to these services Patient has a $20 copay whether or not an office visit is billed Patient has an OOP max of $3400 and has met $390.37 Once OOP has been met coverage goes to 100% and copays will no longer apply

## 2024-03-07 NOTE — Telephone Encounter (Signed)
 Patient is scheduled 04/14/24

## 2024-03-08 DIAGNOSIS — Z471 Aftercare following joint replacement surgery: Secondary | ICD-10-CM | POA: Diagnosis not present

## 2024-03-08 DIAGNOSIS — M4316 Spondylolisthesis, lumbar region: Secondary | ICD-10-CM | POA: Diagnosis not present

## 2024-03-15 DIAGNOSIS — Z471 Aftercare following joint replacement surgery: Secondary | ICD-10-CM | POA: Diagnosis not present

## 2024-03-15 DIAGNOSIS — M4316 Spondylolisthesis, lumbar region: Secondary | ICD-10-CM | POA: Diagnosis not present

## 2024-04-04 DIAGNOSIS — Z471 Aftercare following joint replacement surgery: Secondary | ICD-10-CM | POA: Diagnosis not present

## 2024-04-04 DIAGNOSIS — M4316 Spondylolisthesis, lumbar region: Secondary | ICD-10-CM | POA: Diagnosis not present

## 2024-04-12 ENCOUNTER — Ambulatory Visit: Admitting: Family Medicine

## 2024-04-12 DIAGNOSIS — M4316 Spondylolisthesis, lumbar region: Secondary | ICD-10-CM | POA: Diagnosis not present

## 2024-04-12 DIAGNOSIS — Z471 Aftercare following joint replacement surgery: Secondary | ICD-10-CM | POA: Diagnosis not present

## 2024-04-14 ENCOUNTER — Ambulatory Visit: Admitting: Family Medicine

## 2024-04-14 ENCOUNTER — Other Ambulatory Visit: Payer: Self-pay

## 2024-04-14 ENCOUNTER — Encounter: Payer: Self-pay | Admitting: Family Medicine

## 2024-04-14 ENCOUNTER — Other Ambulatory Visit (HOSPITAL_COMMUNITY): Payer: Self-pay

## 2024-04-14 VITALS — BP 122/70 | HR 71 | Ht 70.0 in | Wt 169.0 lb

## 2024-04-14 DIAGNOSIS — M25561 Pain in right knee: Secondary | ICD-10-CM | POA: Diagnosis not present

## 2024-04-14 DIAGNOSIS — L598 Other specified disorders of the skin and subcutaneous tissue related to radiation: Secondary | ICD-10-CM

## 2024-04-14 DIAGNOSIS — M170 Bilateral primary osteoarthritis of knee: Secondary | ICD-10-CM | POA: Diagnosis not present

## 2024-04-14 DIAGNOSIS — M25562 Pain in left knee: Secondary | ICD-10-CM

## 2024-04-14 DIAGNOSIS — M7122 Synovial cyst of popliteal space [Baker], left knee: Secondary | ICD-10-CM

## 2024-04-14 DIAGNOSIS — G8929 Other chronic pain: Secondary | ICD-10-CM

## 2024-04-14 MED ORDER — GABAPENTIN 100 MG PO CAPS
100.0000 mg | ORAL_CAPSULE | Freq: Every evening | ORAL | 2 refills | Status: DC | PRN
  Filled 2024-04-14 – 2024-04-15 (×2): qty 90, 30d supply, fill #0
  Filled 2024-07-05: qty 90, 30d supply, fill #1
  Filled 2024-08-18: qty 90, 30d supply, fill #2

## 2024-04-14 MED ORDER — TRIAMCINOLONE ACETONIDE 32 MG IX SRER
32.0000 mg | Freq: Once | INTRA_ARTICULAR | Status: AC
Start: 2024-04-14 — End: 2024-04-14
  Administered 2024-04-14: 32 mg via INTRA_ARTICULAR

## 2024-04-14 NOTE — Progress Notes (Unsigned)
   I, Miquel Amen, CMA acting as a Neurosurgeon for [name], MD.  Claire Wall is a 73 y.o. female who presents to [facility] at [facility] today for exacerbation of her bilat knee pain. Pt was last seen by Dr. Alease Hunter on 01/14/24 and was bilat knee Zilretta  injections.  Today, pt reports exacerbation of bilateral knee sx over the past 3 weeks. Feels like baker's cyst has come back, swelling into the lower leg. Denies mechanical sx. Taking Tylenol  if playing tennis. Sx sx are limiting in Tennis.   Dx testing: 01/04/24 R knee XR 03/12/23 L LE vasc US              06/02/22 L knee MRI  Pertinent review of systems: ***  Relevant historical information: ***   Exam:  LMP  (LMP Unknown)  General: Well Developed, well nourished, and in no acute distress.   MSK: ***    Lab and Radiology Results No results found for this or any previous visit (from the past 72 hours). No results found.     Assessment and Plan: 73 y.o. female with ***   PDMP not reviewed this encounter. No orders of the defined types were placed in this encounter.  No orders of the defined types were placed in this encounter.    Discussed warning signs or symptoms. Please see discharge instructions. Patient expresses understanding.   ***

## 2024-04-14 NOTE — Patient Instructions (Addendum)
 Thank you for coming in today.   You received an injection today. Seek immediate medical attention if the joint becomes red, extremely painful, or is oozing fluid.   Try gabapentin  at bedtime as needed for nerve pain.   I can drain that baker cyst if needed in 1-2 weeks if not better.   If that does work let Dr Adonis Alamin or Crecencio Dodge know to do another back injection.

## 2024-04-15 ENCOUNTER — Other Ambulatory Visit (HOSPITAL_COMMUNITY): Payer: Self-pay

## 2024-04-15 DIAGNOSIS — M170 Bilateral primary osteoarthritis of knee: Secondary | ICD-10-CM | POA: Insufficient documentation

## 2024-04-15 DIAGNOSIS — E039 Hypothyroidism, unspecified: Secondary | ICD-10-CM | POA: Diagnosis not present

## 2024-04-15 DIAGNOSIS — Z006 Encounter for examination for normal comparison and control in clinical research program: Secondary | ICD-10-CM | POA: Diagnosis not present

## 2024-04-15 DIAGNOSIS — Z79899 Other long term (current) drug therapy: Secondary | ICD-10-CM | POA: Diagnosis not present

## 2024-04-15 DIAGNOSIS — M7122 Synovial cyst of popliteal space [Baker], left knee: Secondary | ICD-10-CM | POA: Insufficient documentation

## 2024-04-15 DIAGNOSIS — L598 Other specified disorders of the skin and subcutaneous tissue related to radiation: Secondary | ICD-10-CM | POA: Insufficient documentation

## 2024-04-15 DIAGNOSIS — C4371 Malignant melanoma of right lower limb, including hip: Secondary | ICD-10-CM | POA: Diagnosis not present

## 2024-04-15 MED ORDER — PREDNISONE 5 MG PO TABS
5.0000 mg | ORAL_TABLET | Freq: Every day | ORAL | 3 refills | Status: DC
  Filled 2024-04-15 (×2): qty 90, 90d supply, fill #0
  Filled 2024-07-04: qty 90, 90d supply, fill #1
  Filled 2024-09-04 – 2024-09-12 (×3): qty 90, 90d supply, fill #2
  Filled 2024-12-23: qty 90, 90d supply, fill #3

## 2024-04-21 DIAGNOSIS — M5416 Radiculopathy, lumbar region: Secondary | ICD-10-CM | POA: Diagnosis not present

## 2024-04-21 DIAGNOSIS — M47816 Spondylosis without myelopathy or radiculopathy, lumbar region: Secondary | ICD-10-CM | POA: Diagnosis not present

## 2024-04-25 DIAGNOSIS — Z471 Aftercare following joint replacement surgery: Secondary | ICD-10-CM | POA: Diagnosis not present

## 2024-04-25 DIAGNOSIS — M4316 Spondylolisthesis, lumbar region: Secondary | ICD-10-CM | POA: Diagnosis not present

## 2024-05-02 DIAGNOSIS — Z471 Aftercare following joint replacement surgery: Secondary | ICD-10-CM | POA: Diagnosis not present

## 2024-05-02 DIAGNOSIS — M4316 Spondylolisthesis, lumbar region: Secondary | ICD-10-CM | POA: Diagnosis not present

## 2024-05-09 ENCOUNTER — Encounter: Payer: Self-pay | Admitting: Family Medicine

## 2024-05-09 ENCOUNTER — Ambulatory Visit

## 2024-05-09 ENCOUNTER — Other Ambulatory Visit: Payer: Self-pay

## 2024-05-09 ENCOUNTER — Ambulatory Visit: Admitting: Family Medicine

## 2024-05-09 VITALS — BP 130/82 | HR 82 | Ht 70.0 in

## 2024-05-09 DIAGNOSIS — M7122 Synovial cyst of popliteal space [Baker], left knee: Secondary | ICD-10-CM | POA: Diagnosis not present

## 2024-05-09 DIAGNOSIS — M1712 Unilateral primary osteoarthritis, left knee: Secondary | ICD-10-CM | POA: Diagnosis not present

## 2024-05-09 DIAGNOSIS — G8929 Other chronic pain: Secondary | ICD-10-CM

## 2024-05-09 DIAGNOSIS — M25562 Pain in left knee: Secondary | ICD-10-CM | POA: Diagnosis not present

## 2024-05-09 DIAGNOSIS — M25561 Pain in right knee: Secondary | ICD-10-CM | POA: Diagnosis not present

## 2024-05-09 DIAGNOSIS — M170 Bilateral primary osteoarthritis of knee: Secondary | ICD-10-CM

## 2024-05-09 NOTE — Progress Notes (Signed)
   I, Claire Wall, CMA acting as a Neurosurgeon for Claire Juniper, MD.  Claire Wall is a 73 y.o. female who presents to [facility] at [facility] today for exacerbation of left knee swelling. Pt was last seen by Dr. Alease Wall on 04/14/24 and received bilateral Zilretta  injections with consideration of possible aspiration if Baker's Cyst sx don't improve. Gabapentin  was added for calf pain due to lumbar radiculopathy.   Today, patient reports continued stiffness/swelling around the knee. Feeling pressure behind the knee. Got a few days of relief after Zirletta injection. Saw Neurosurgery on May 1st, scheduled for test ablation for back pain. Saw Oncologist on April 5th and discuss immunotherapy and joint destruction and chronic fatigue.. Taking Gabapentin  200 mg at bedtime which has been helpful.   Dx testing: 01/04/24 R knee XR                   03/12/23 L LE vasc US                    06/02/22 L knee MRI  Pertinent review of systems: No fevers or chills   Relevant historical information: Hypertension history of melanoma   Exam:  BP 130/82   Pulse 82   Ht 5\' 10"  (1.778 m)   LMP  (LMP Unknown)   SpO2 (!) 5%   BMI 24.25 kg/m  General: Well Developed, well nourished, and in no acute distress.   MSK: Left knee normal-appearing Normal motion.    Lab and Radiology Results  Diagnostic Limited MSK Ultrasound of: Posterior left knee Tiny Baker's cyst Impression: Tiny Baker's cyst left knee   X-ray images left knee obtained today personally and independently interpreted. Mild medial DJD. Await formal radiology review   Assessment and Plan: 73 y.o. female with chronic left knee pain due to DJD exacerbation.  She does have a tiny Baker's cyst but today is not big enough to drain.  Plan for updated x-ray and watchful waiting.   PDMP not reviewed this encounter. Orders Placed This Encounter  Procedures   US  LIMITED JOINT SPACE STRUCTURES LOW LEFT(NO LINKED CHARGES)     Reason for Exam (SYMPTOM  OR DIAGNOSIS REQUIRED):   left knee swelling    Preferred imaging location?:   [facility] Knee AP/LAT W/[place] Left    Standing Status:   Future    Number of Occurrences:   1    Expiration Date:   05/09/2025    Reason for Exam (SYMPTOM  OR DIAGNOSIS REQUIRED):   eval knee pain    Preferred imaging location?:   [place] [facility]   No orders of the defined types were placed in this encounter.    Discussed warning signs or symptoms. Please see discharge instructions. Patient expresses understanding.   The above documentation has been reviewed and is accurate and complete Claire Wall, M.D.

## 2024-05-10 DIAGNOSIS — M47816 Spondylosis without myelopathy or radiculopathy, lumbar region: Secondary | ICD-10-CM | POA: Diagnosis not present

## 2024-05-10 DIAGNOSIS — M4316 Spondylolisthesis, lumbar region: Secondary | ICD-10-CM | POA: Diagnosis not present

## 2024-05-10 DIAGNOSIS — Z471 Aftercare following joint replacement surgery: Secondary | ICD-10-CM | POA: Diagnosis not present

## 2024-05-10 NOTE — Patient Instructions (Signed)
 Thank you for coming in today.

## 2024-05-17 ENCOUNTER — Ambulatory Visit: Payer: Self-pay | Admitting: Family Medicine

## 2024-05-17 NOTE — Progress Notes (Signed)
 Left knee x-ray shows mild arthritis.

## 2024-05-19 DIAGNOSIS — Z471 Aftercare following joint replacement surgery: Secondary | ICD-10-CM | POA: Diagnosis not present

## 2024-05-19 DIAGNOSIS — M4316 Spondylolisthesis, lumbar region: Secondary | ICD-10-CM | POA: Diagnosis not present

## 2024-05-23 DIAGNOSIS — C4371 Malignant melanoma of right lower limb, including hip: Secondary | ICD-10-CM | POA: Diagnosis not present

## 2024-05-23 DIAGNOSIS — L598 Other specified disorders of the skin and subcutaneous tissue related to radiation: Secondary | ICD-10-CM | POA: Diagnosis not present

## 2024-05-23 DIAGNOSIS — M792 Neuralgia and neuritis, unspecified: Secondary | ICD-10-CM | POA: Diagnosis not present

## 2024-05-24 DIAGNOSIS — M4316 Spondylolisthesis, lumbar region: Secondary | ICD-10-CM | POA: Diagnosis not present

## 2024-05-24 DIAGNOSIS — Z471 Aftercare following joint replacement surgery: Secondary | ICD-10-CM | POA: Diagnosis not present

## 2024-05-25 DIAGNOSIS — M47816 Spondylosis without myelopathy or radiculopathy, lumbar region: Secondary | ICD-10-CM | POA: Diagnosis not present

## 2024-05-31 DIAGNOSIS — M4316 Spondylolisthesis, lumbar region: Secondary | ICD-10-CM | POA: Diagnosis not present

## 2024-05-31 DIAGNOSIS — Z471 Aftercare following joint replacement surgery: Secondary | ICD-10-CM | POA: Diagnosis not present

## 2024-06-03 ENCOUNTER — Other Ambulatory Visit: Payer: Self-pay | Admitting: Internal Medicine

## 2024-06-03 DIAGNOSIS — Z1231 Encounter for screening mammogram for malignant neoplasm of breast: Secondary | ICD-10-CM

## 2024-06-07 DIAGNOSIS — Z471 Aftercare following joint replacement surgery: Secondary | ICD-10-CM | POA: Diagnosis not present

## 2024-06-07 DIAGNOSIS — M4316 Spondylolisthesis, lumbar region: Secondary | ICD-10-CM | POA: Diagnosis not present

## 2024-06-13 DIAGNOSIS — M4316 Spondylolisthesis, lumbar region: Secondary | ICD-10-CM | POA: Diagnosis not present

## 2024-06-13 DIAGNOSIS — Z471 Aftercare following joint replacement surgery: Secondary | ICD-10-CM | POA: Diagnosis not present

## 2024-06-20 DIAGNOSIS — M4316 Spondylolisthesis, lumbar region: Secondary | ICD-10-CM | POA: Diagnosis not present

## 2024-06-20 DIAGNOSIS — Z471 Aftercare following joint replacement surgery: Secondary | ICD-10-CM | POA: Diagnosis not present

## 2024-06-27 DIAGNOSIS — M4316 Spondylolisthesis, lumbar region: Secondary | ICD-10-CM | POA: Diagnosis not present

## 2024-06-27 DIAGNOSIS — Z471 Aftercare following joint replacement surgery: Secondary | ICD-10-CM | POA: Diagnosis not present

## 2024-06-29 DIAGNOSIS — M47816 Spondylosis without myelopathy or radiculopathy, lumbar region: Secondary | ICD-10-CM | POA: Diagnosis not present

## 2024-06-30 DIAGNOSIS — Z96641 Presence of right artificial hip joint: Secondary | ICD-10-CM | POA: Diagnosis not present

## 2024-06-30 DIAGNOSIS — Z471 Aftercare following joint replacement surgery: Secondary | ICD-10-CM | POA: Diagnosis not present

## 2024-07-04 DIAGNOSIS — D2262 Melanocytic nevi of left upper limb, including shoulder: Secondary | ICD-10-CM | POA: Diagnosis not present

## 2024-07-04 DIAGNOSIS — L821 Other seborrheic keratosis: Secondary | ICD-10-CM | POA: Diagnosis not present

## 2024-07-04 DIAGNOSIS — Z85820 Personal history of malignant melanoma of skin: Secondary | ICD-10-CM | POA: Diagnosis not present

## 2024-07-04 DIAGNOSIS — D1801 Hemangioma of skin and subcutaneous tissue: Secondary | ICD-10-CM | POA: Diagnosis not present

## 2024-07-04 DIAGNOSIS — L820 Inflamed seborrheic keratosis: Secondary | ICD-10-CM | POA: Diagnosis not present

## 2024-07-04 DIAGNOSIS — L812 Freckles: Secondary | ICD-10-CM | POA: Diagnosis not present

## 2024-07-04 DIAGNOSIS — D692 Other nonthrombocytopenic purpura: Secondary | ICD-10-CM | POA: Diagnosis not present

## 2024-07-04 DIAGNOSIS — Z85828 Personal history of other malignant neoplasm of skin: Secondary | ICD-10-CM | POA: Diagnosis not present

## 2024-07-04 DIAGNOSIS — D2261 Melanocytic nevi of right upper limb, including shoulder: Secondary | ICD-10-CM | POA: Diagnosis not present

## 2024-07-04 DIAGNOSIS — D225 Melanocytic nevi of trunk: Secondary | ICD-10-CM | POA: Diagnosis not present

## 2024-07-05 DIAGNOSIS — M4316 Spondylolisthesis, lumbar region: Secondary | ICD-10-CM | POA: Diagnosis not present

## 2024-07-05 DIAGNOSIS — Z471 Aftercare following joint replacement surgery: Secondary | ICD-10-CM | POA: Diagnosis not present

## 2024-07-08 ENCOUNTER — Other Ambulatory Visit (HOSPITAL_COMMUNITY): Payer: Self-pay

## 2024-07-08 MED ORDER — OLMESARTAN MEDOXOMIL 20 MG PO TABS
20.0000 mg | ORAL_TABLET | Freq: Every day | ORAL | 3 refills | Status: AC
  Filled 2024-07-08: qty 90, 90d supply, fill #0
  Filled 2024-10-01: qty 90, 90d supply, fill #1
  Filled 2024-12-30: qty 90, 90d supply, fill #2

## 2024-07-11 ENCOUNTER — Ambulatory Visit
Admission: RE | Admit: 2024-07-11 | Discharge: 2024-07-11 | Disposition: A | Discharge status: Home / Self Care | Source: Ambulatory Visit

## 2024-07-11 DIAGNOSIS — Z1231 Encounter for screening mammogram for malignant neoplasm of breast: Secondary | ICD-10-CM | POA: Diagnosis not present

## 2024-07-11 DIAGNOSIS — Z471 Aftercare following joint replacement surgery: Secondary | ICD-10-CM | POA: Diagnosis not present

## 2024-07-11 DIAGNOSIS — M4316 Spondylolisthesis, lumbar region: Secondary | ICD-10-CM | POA: Diagnosis not present

## 2024-07-14 ENCOUNTER — Other Ambulatory Visit: Payer: Self-pay | Admitting: Internal Medicine

## 2024-07-14 DIAGNOSIS — R928 Other abnormal and inconclusive findings on diagnostic imaging of breast: Secondary | ICD-10-CM

## 2024-07-18 ENCOUNTER — Ambulatory Visit: Admitting: Family Medicine

## 2024-07-18 ENCOUNTER — Ambulatory Visit

## 2024-07-18 ENCOUNTER — Ambulatory Visit
Admission: RE | Admit: 2024-07-18 | Discharge: 2024-07-18 | Disposition: A | Discharge status: Home / Self Care | Source: Ambulatory Visit | Attending: Internal Medicine | Admitting: Internal Medicine

## 2024-07-18 DIAGNOSIS — N6321 Unspecified lump in the left breast, upper outer quadrant: Secondary | ICD-10-CM | POA: Diagnosis not present

## 2024-07-18 DIAGNOSIS — R928 Other abnormal and inconclusive findings on diagnostic imaging of breast: Secondary | ICD-10-CM | POA: Diagnosis not present

## 2024-07-18 NOTE — Telephone Encounter (Signed)
 Please re-auth Zilretta  bilat knees

## 2024-07-18 NOTE — Progress Notes (Deleted)
   [name], PhD, LAT, ATC acting as a scribe for Artist Lloyd, MD.  Claire Wall is a 73 y.o. female who presents to [facility] at [facility] today for exacerbation of her bilat knee pain. Pt was last seen by Dr. Lloyd on 05/09/24.  Last bilateral Zilretta  injections, 04/14/24.  Today, pt reports ***   Dx testing: 05/09/24 L knee XR 01/04/24 R knee XR                   03/12/23 L LE vasc US                    06/02/22 L knee MRI  Pertinent review of systems: ***  Relevant historical information: ***   Exam:  LMP  (LMP Unknown)  General: Well Developed, well nourished, and in no acute distress.   MSK: ***    Lab and Radiology Results No results found for this or any previous visit (from the past 72 hours). No results found.     Assessment and Plan: 73 y.o. female with ***   PDMP not reviewed this encounter. No orders of the defined types were placed in this encounter.  No orders of the defined types were placed in this encounter.    Discussed warning signs or symptoms. Please see discharge instructions. Patient expresses understanding.   ***

## 2024-07-18 NOTE — Telephone Encounter (Signed)
 Ran benefits case ID [id number]

## 2024-07-19 DIAGNOSIS — M4316 Spondylolisthesis, lumbar region: Secondary | ICD-10-CM | POA: Diagnosis not present

## 2024-07-19 DIAGNOSIS — Z471 Aftercare following joint replacement surgery: Secondary | ICD-10-CM | POA: Diagnosis not present

## 2024-07-20 ENCOUNTER — Telehealth: Payer: Self-pay

## 2024-07-20 NOTE — Progress Notes (Unsigned)
   Claire Ileana Collet, PhD, LAT, ATC acting as a scribe for Artist Lloyd, MD.  Claire Wall is a 73 y.o. female who presents to [facility] at [facility] today for exacerbation of her bilat knee pain. Pt was last seen by Dr. Lloyd on 05/09/24.  Last bilateral Zilretta  injections, 04/14/24.  Today, pt reports ***   Dx testing: 05/09/24 L knee XR 01/04/24 R knee XR                   03/12/23 L LE vasc US                    06/02/22 L knee MRI  Pertinent review of systems: ***  Relevant historical information: ***   Exam:  LMP  (LMP Unknown)  General: Well Developed, well nourished, and in no acute distress.   MSK: ***    Lab and Radiology Results No results found for this or any previous visit (from the past 72 hours). MM 3D DIAGNOSTIC MAMMOGRAM UNILATERAL LEFT BREAST Result Date: 07/18/2024 CLINICAL DATA:  Recall from screening to evaluate a possible left breast asymmetry. EXAM: DIGITAL DIAGNOSTIC UNILATERAL LEFT MAMMOGRAM WITH TOMOSYNTHESIS AND CAD TECHNIQUE: Left digital diagnostic mammography and breast tomosynthesis was performed. The images were evaluated with computer-aided detection. COMPARISON:  Previous exam(s). ACR Breast Density Category b: There are scattered areas of fibroglandular density. FINDINGS: Multiple additional images of the left breast were obtained. There is a subtle persistent asymmetry over the upper-outer left breast without significant change from prior exams. There are a few coarse calcifications over this region likely vascular and unchanged. IMPRESSION: No concerning abnormality over the upper outer left breast. RECOMMENDATION: Recommend continued annual bilateral screening mammographic follow-up. I have discussed the findings and recommendations with the patient. If applicable, a reminder letter will be sent to the patient regarding the next appointment. BI-RADS CATEGORY  1: Negative. Electronically Signed   By: Toribio Agreste M.D.   On:  07/18/2024 13:57       Assessment and Plan: 73 y.o. female with ***   PDMP not reviewed this encounter. No orders of the defined types were placed in this encounter.  No orders of the defined types were placed in this encounter.    Discussed warning signs or symptoms. Please see discharge instructions. Patient expresses understanding.   ***

## 2024-07-20 NOTE — Telephone Encounter (Signed)
 Scheduled

## 2024-07-20 NOTE — Telephone Encounter (Signed)
 Please schedule patient when medication is stocked  Zilretta  authorized for bilateral knee NO PA REQUIRED Coinsurance 80% Copay $20 Deductible does not apply OOP MAX $3400 has met $1169.90 Once OOP has been met coverage goes to 100% and copay will no longer apply REFERENCE # [id number]

## 2024-07-21 ENCOUNTER — Ambulatory Visit: Admitting: Family Medicine

## 2024-07-21 ENCOUNTER — Other Ambulatory Visit: Payer: Self-pay

## 2024-07-21 ENCOUNTER — Encounter: Payer: Self-pay | Admitting: Family Medicine

## 2024-07-21 VITALS — BP 104/72 | HR 66 | Ht 70.0 in | Wt 179.0 lb

## 2024-07-21 DIAGNOSIS — M25561 Pain in right knee: Secondary | ICD-10-CM | POA: Diagnosis not present

## 2024-07-21 DIAGNOSIS — G8929 Other chronic pain: Secondary | ICD-10-CM

## 2024-07-21 DIAGNOSIS — M25562 Pain in left knee: Secondary | ICD-10-CM

## 2024-07-21 DIAGNOSIS — M170 Bilateral primary osteoarthritis of knee: Secondary | ICD-10-CM | POA: Diagnosis not present

## 2024-07-21 MED ORDER — TRIAMCINOLONE ACETONIDE 32 MG IX SRER
32.0000 mg | Freq: Once | INTRA_ARTICULAR | Status: AC
  Administered 2024-07-21: 32 mg via INTRA_ARTICULAR

## 2024-07-21 NOTE — Patient Instructions (Addendum)
 Thank you for coming in today.   You received an injection today. Seek immediate medical attention if the joint becomes red, extremely painful, or is oozing fluid.

## 2024-07-25 DIAGNOSIS — Z471 Aftercare following joint replacement surgery: Secondary | ICD-10-CM | POA: Diagnosis not present

## 2024-07-25 DIAGNOSIS — M4316 Spondylolisthesis, lumbar region: Secondary | ICD-10-CM | POA: Diagnosis not present

## 2024-07-26 DIAGNOSIS — E7800 Pure hypercholesterolemia, unspecified: Secondary | ICD-10-CM | POA: Diagnosis not present

## 2024-07-26 DIAGNOSIS — I10 Essential (primary) hypertension: Secondary | ICD-10-CM | POA: Diagnosis not present

## 2024-07-26 DIAGNOSIS — E7849 Other hyperlipidemia: Secondary | ICD-10-CM | POA: Diagnosis not present

## 2024-07-26 DIAGNOSIS — Z1212 Encounter for screening for malignant neoplasm of rectum: Secondary | ICD-10-CM | POA: Diagnosis not present

## 2024-07-26 DIAGNOSIS — M8580 Other specified disorders of bone density and structure, unspecified site: Secondary | ICD-10-CM | POA: Diagnosis not present

## 2024-07-27 DIAGNOSIS — M47816 Spondylosis without myelopathy or radiculopathy, lumbar region: Secondary | ICD-10-CM | POA: Diagnosis not present

## 2024-08-01 DIAGNOSIS — M4316 Spondylolisthesis, lumbar region: Secondary | ICD-10-CM | POA: Diagnosis not present

## 2024-08-01 DIAGNOSIS — Z471 Aftercare following joint replacement surgery: Secondary | ICD-10-CM | POA: Diagnosis not present

## 2024-08-02 DIAGNOSIS — R7989 Other specified abnormal findings of blood chemistry: Secondary | ICD-10-CM | POA: Diagnosis not present

## 2024-08-02 DIAGNOSIS — I10 Essential (primary) hypertension: Secondary | ICD-10-CM | POA: Diagnosis not present

## 2024-08-02 DIAGNOSIS — E7800 Pure hypercholesterolemia, unspecified: Secondary | ICD-10-CM | POA: Diagnosis not present

## 2024-08-02 DIAGNOSIS — C4371 Malignant melanoma of right lower limb, including hip: Secondary | ICD-10-CM | POA: Diagnosis not present

## 2024-08-02 DIAGNOSIS — I6523 Occlusion and stenosis of bilateral carotid arteries: Secondary | ICD-10-CM | POA: Diagnosis not present

## 2024-08-02 DIAGNOSIS — M545 Low back pain, unspecified: Secondary | ICD-10-CM | POA: Diagnosis not present

## 2024-08-02 DIAGNOSIS — Z1339 Encounter for screening examination for other mental health and behavioral disorders: Secondary | ICD-10-CM | POA: Diagnosis not present

## 2024-08-02 DIAGNOSIS — R82998 Other abnormal findings in urine: Secondary | ICD-10-CM | POA: Diagnosis not present

## 2024-08-02 DIAGNOSIS — M8580 Other specified disorders of bone density and structure, unspecified site: Secondary | ICD-10-CM | POA: Diagnosis not present

## 2024-08-02 DIAGNOSIS — I700 Atherosclerosis of aorta: Secondary | ICD-10-CM | POA: Diagnosis not present

## 2024-08-02 DIAGNOSIS — M792 Neuralgia and neuritis, unspecified: Secondary | ICD-10-CM | POA: Diagnosis not present

## 2024-08-02 DIAGNOSIS — Z0000 Encounter for general adult medical examination without abnormal findings: Secondary | ICD-10-CM | POA: Diagnosis not present

## 2024-08-02 DIAGNOSIS — Z1331 Encounter for screening for depression: Secondary | ICD-10-CM | POA: Diagnosis not present

## 2024-08-08 DIAGNOSIS — M4316 Spondylolisthesis, lumbar region: Secondary | ICD-10-CM | POA: Diagnosis not present

## 2024-08-08 DIAGNOSIS — Z471 Aftercare following joint replacement surgery: Secondary | ICD-10-CM | POA: Diagnosis not present

## 2024-08-16 DIAGNOSIS — M4316 Spondylolisthesis, lumbar region: Secondary | ICD-10-CM | POA: Diagnosis not present

## 2024-08-16 DIAGNOSIS — Z471 Aftercare following joint replacement surgery: Secondary | ICD-10-CM | POA: Diagnosis not present

## 2024-08-23 ENCOUNTER — Other Ambulatory Visit (HOSPITAL_COMMUNITY): Payer: Self-pay

## 2024-08-23 MED ORDER — METHYLPREDNISOLONE 4 MG PO TBPK
ORAL_TABLET | ORAL | 0 refills | Status: AC
  Filled 2024-08-23: qty 21, 6d supply, fill #0

## 2024-08-24 ENCOUNTER — Other Ambulatory Visit (HOSPITAL_COMMUNITY): Payer: Self-pay

## 2024-08-24 DIAGNOSIS — M4316 Spondylolisthesis, lumbar region: Secondary | ICD-10-CM | POA: Diagnosis not present

## 2024-08-24 DIAGNOSIS — Z471 Aftercare following joint replacement surgery: Secondary | ICD-10-CM | POA: Diagnosis not present

## 2024-08-30 DIAGNOSIS — M4316 Spondylolisthesis, lumbar region: Secondary | ICD-10-CM | POA: Diagnosis not present

## 2024-08-30 DIAGNOSIS — M5416 Radiculopathy, lumbar region: Secondary | ICD-10-CM | POA: Diagnosis not present

## 2024-08-30 DIAGNOSIS — Z471 Aftercare following joint replacement surgery: Secondary | ICD-10-CM | POA: Diagnosis not present

## 2024-08-30 DIAGNOSIS — M47816 Spondylosis without myelopathy or radiculopathy, lumbar region: Secondary | ICD-10-CM | POA: Diagnosis not present

## 2024-09-05 ENCOUNTER — Other Ambulatory Visit (HOSPITAL_COMMUNITY): Payer: Self-pay

## 2024-09-06 DIAGNOSIS — M4316 Spondylolisthesis, lumbar region: Secondary | ICD-10-CM | POA: Diagnosis not present

## 2024-09-06 DIAGNOSIS — Z471 Aftercare following joint replacement surgery: Secondary | ICD-10-CM | POA: Diagnosis not present

## 2024-09-13 DIAGNOSIS — M4316 Spondylolisthesis, lumbar region: Secondary | ICD-10-CM | POA: Diagnosis not present

## 2024-09-13 DIAGNOSIS — Z471 Aftercare following joint replacement surgery: Secondary | ICD-10-CM | POA: Diagnosis not present

## 2024-09-19 DIAGNOSIS — M4316 Spondylolisthesis, lumbar region: Secondary | ICD-10-CM | POA: Diagnosis not present

## 2024-09-19 DIAGNOSIS — Z471 Aftercare following joint replacement surgery: Secondary | ICD-10-CM | POA: Diagnosis not present

## 2024-09-29 DIAGNOSIS — M5416 Radiculopathy, lumbar region: Secondary | ICD-10-CM | POA: Diagnosis not present

## 2024-10-01 ENCOUNTER — Other Ambulatory Visit (HOSPITAL_COMMUNITY): Payer: Self-pay

## 2024-10-03 DIAGNOSIS — Z471 Aftercare following joint replacement surgery: Secondary | ICD-10-CM | POA: Diagnosis not present

## 2024-10-03 DIAGNOSIS — M4316 Spondylolisthesis, lumbar region: Secondary | ICD-10-CM | POA: Diagnosis not present

## 2024-10-04 ENCOUNTER — Other Ambulatory Visit: Payer: Self-pay | Admitting: Family Medicine

## 2024-10-04 ENCOUNTER — Other Ambulatory Visit (HOSPITAL_COMMUNITY): Payer: Self-pay

## 2024-10-04 MED ORDER — GABAPENTIN 100 MG PO CAPS
100.0000 mg | ORAL_CAPSULE | Freq: Every evening | ORAL | 2 refills | Status: AC | PRN
  Filled 2024-10-04: qty 90, 30d supply, fill #0
  Filled 2024-11-23: qty 90, 30d supply, fill #1
  Filled 2025-01-14: qty 90, 30d supply, fill #2

## 2024-10-04 NOTE — Telephone Encounter (Signed)
 Last OV 07/21/24 Next OV not scheduled  Last refill 04/14/24 Qty #90/2

## 2024-10-05 ENCOUNTER — Other Ambulatory Visit: Payer: Self-pay

## 2024-10-05 NOTE — Telephone Encounter (Signed)
 Patient self scheduled for repeat Zilretta  injections on 11/4. Okay to proceed?

## 2024-10-05 NOTE — Telephone Encounter (Signed)
 Ran benefits for bilateral zilretta  case ID [id number]

## 2024-10-11 DIAGNOSIS — M4316 Spondylolisthesis, lumbar region: Secondary | ICD-10-CM | POA: Diagnosis not present

## 2024-10-11 DIAGNOSIS — Z471 Aftercare following joint replacement surgery: Secondary | ICD-10-CM | POA: Diagnosis not present

## 2024-10-14 ENCOUNTER — Telehealth: Payer: Self-pay

## 2024-10-14 NOTE — Telephone Encounter (Signed)
 Patient scheduled 10/25/24  Zilretta  authorized for bilateral knee NO PA REQUIRED Coinsurance 80% Copay $20 Deductible does not apply OOP MAX $3400 has met $1169.90 Once OOP has been met coverage goes to 100% and copay will no longer apply REFERENCE # [id number]

## 2024-10-17 NOTE — Telephone Encounter (Signed)
 Noted

## 2024-10-18 DIAGNOSIS — Z471 Aftercare following joint replacement surgery: Secondary | ICD-10-CM | POA: Diagnosis not present

## 2024-10-18 DIAGNOSIS — M4316 Spondylolisthesis, lumbar region: Secondary | ICD-10-CM | POA: Diagnosis not present

## 2024-10-24 DIAGNOSIS — M4316 Spondylolisthesis, lumbar region: Secondary | ICD-10-CM | POA: Diagnosis not present

## 2024-10-24 DIAGNOSIS — Z471 Aftercare following joint replacement surgery: Secondary | ICD-10-CM | POA: Diagnosis not present

## 2024-10-25 ENCOUNTER — Other Ambulatory Visit: Payer: Self-pay

## 2024-10-25 ENCOUNTER — Encounter: Payer: Self-pay | Admitting: Family Medicine

## 2024-10-25 ENCOUNTER — Ambulatory Visit: Admitting: Family Medicine

## 2024-10-25 VITALS — BP 128/82 | HR 75 | Ht 70.0 in | Wt 172.0 lb

## 2024-10-25 DIAGNOSIS — M25562 Pain in left knee: Secondary | ICD-10-CM | POA: Diagnosis not present

## 2024-10-25 DIAGNOSIS — G8929 Other chronic pain: Secondary | ICD-10-CM | POA: Diagnosis not present

## 2024-10-25 DIAGNOSIS — M25561 Pain in right knee: Secondary | ICD-10-CM | POA: Diagnosis not present

## 2024-10-25 DIAGNOSIS — M170 Bilateral primary osteoarthritis of knee: Secondary | ICD-10-CM

## 2024-10-25 MED ORDER — TRIAMCINOLONE ACETONIDE 32 MG IX SRER
32.0000 mg | Freq: Once | INTRA_ARTICULAR | Status: AC
  Administered 2024-10-25: 32 mg via INTRA_ARTICULAR

## 2024-10-25 NOTE — Patient Instructions (Signed)
 Thank you for coming in today.   You received an injection today. Seek immediate medical attention if the joint becomes red, extremely painful, or is oozing fluid.   Consider repeat injections in 12 weeks and 1 day.

## 2024-10-25 NOTE — Progress Notes (Signed)
 I, Leotis Batter, CMA acting as a neurosurgeon for Artist Lloyd, MD.  Claire Wall is a 73 y.o. female who presents to [facility] at [facility] today for exacerbation of her bilat knee pain. Pt was last seen by Dr. Lloyd on 07/21/24 and was given bilat Zilretta  injections.  Today, pt reports exacerbation of bilat knee pain over the past month. Sx responded well to last Zilretta  injections.  Was pain free during trip to [place], really enjoyed the balloon festival. Intermittent swelling behind the left knee.    Dx testing: 05/09/24 L knee XR 01/04/24 R knee XR             03/12/23 L LE vasc US              06/02/22 L knee MRI  Pertinent review of systems: No fevers or chills  Relevant historical information: History of melanoma right calf   Exam:  BP 128/82   Pulse 75   Ht 5' 10 (1.778 m)   Wt 172 lb (78 kg)   LMP  (LMP Unknown)   SpO2 96%   BMI 24.68 kg/m  General: Well Developed, well nourished, and in no acute distress.   MSK: Knees bilaterally minimal effusion normal gait.    Lab and Radiology Results   Zilretta  injection bilateral knee Procedure: Real-time Ultrasound Guided Injection of right knee joint superior lateral patellar space Device: Philips Affiniti 50G Images permanently stored and available for review in PACS Verbal informed consent obtained.  Discussed risks and benefits of procedure. Warned about infection, hyperglycemia bleeding, damage to structures among others. Patient expresses understanding and agreement Time-out conducted.   Noted no overlying erythema, induration, or other signs of local infection.   Skin prepped in a sterile fashion.   Local anesthesia: Topical Ethyl chloride.   With sterile technique and under real time ultrasound guidance: Zilretta  32 mg injected into knee joint. Fluid seen entering the joint capsule.   Completed without difficulty   Advised to call if fevers/chills, erythema, induration, drainage, or persistent  bleeding.   Images permanently stored and available for review in the ultrasound unit.  Impression: Technically successful ultrasound guided injection.   Procedure: Real-time Ultrasound Guided Injection of left knee joint superior lateral patellar space Device: Philips Affiniti 50G Images permanently stored and available for review in PACS Verbal informed consent obtained.  Discussed risks and benefits of procedure. Warned about infection, hyperglycemia bleeding, damage to structures among others. Patient expresses understanding and agreement Time-out conducted.   Noted no overlying erythema, induration, or other signs of local infection.   Skin prepped in a sterile fashion.   Local anesthesia: Topical Ethyl chloride.   With sterile technique and under real time ultrasound guidance: Zilretta  32 mg injected into knee joint. Fluid seen entering the joint capsule.   Completed without difficulty   Advised to call if fevers/chills, erythema, induration, drainage, or persistent bleeding.   Images permanently stored and available for review in the ultrasound unit.  Impression: Technically successful ultrasound guided injection.  Lot number: [id number]     Assessment and Plan: 73 y.o. female with bilateral knee pain due to DJD exacerbation.  Plan for Zilretta  injection today.  Recheck in about 3 months if symptoms recur.   PDMP not reviewed this encounter. Orders Placed This Encounter  Procedures   US  LIMITED JOINT SPACE STRUCTURES LOW BILAT(NO LINKED CHARGES)    Reason for Exam (SYMPTOM  OR DIAGNOSIS REQUIRED):   bilat knee pain / OA  Preferred imaging location?:   [facility] ordered this encounter  Medications   Triamcinolone  Acetonide (ZILRETTA ) intra-articular injection 32 mg   Triamcinolone  Acetonide (ZILRETTA ) intra-articular injection 32 mg     Discussed warning signs or symptoms. Please see discharge instructions. Patient expresses  understanding.   The above documentation has been reviewed and is accurate and complete Artist Lloyd, M.D.

## 2024-10-26 DIAGNOSIS — Z23 Encounter for immunization: Secondary | ICD-10-CM | POA: Diagnosis not present

## 2024-10-31 DIAGNOSIS — Z471 Aftercare following joint replacement surgery: Secondary | ICD-10-CM | POA: Diagnosis not present

## 2024-10-31 DIAGNOSIS — M4316 Spondylolisthesis, lumbar region: Secondary | ICD-10-CM | POA: Diagnosis not present

## 2024-11-07 DIAGNOSIS — L570 Actinic keratosis: Secondary | ICD-10-CM | POA: Diagnosis not present

## 2024-11-07 DIAGNOSIS — D1801 Hemangioma of skin and subcutaneous tissue: Secondary | ICD-10-CM | POA: Diagnosis not present

## 2024-11-07 DIAGNOSIS — Z85820 Personal history of malignant melanoma of skin: Secondary | ICD-10-CM | POA: Diagnosis not present

## 2024-11-07 DIAGNOSIS — D225 Melanocytic nevi of trunk: Secondary | ICD-10-CM | POA: Diagnosis not present

## 2024-11-07 DIAGNOSIS — Z85828 Personal history of other malignant neoplasm of skin: Secondary | ICD-10-CM | POA: Diagnosis not present

## 2024-11-07 DIAGNOSIS — L812 Freckles: Secondary | ICD-10-CM | POA: Diagnosis not present

## 2024-11-07 DIAGNOSIS — L820 Inflamed seborrheic keratosis: Secondary | ICD-10-CM | POA: Diagnosis not present

## 2024-11-07 DIAGNOSIS — L821 Other seborrheic keratosis: Secondary | ICD-10-CM | POA: Diagnosis not present

## 2024-11-07 DIAGNOSIS — D485 Neoplasm of uncertain behavior of skin: Secondary | ICD-10-CM | POA: Diagnosis not present

## 2024-11-08 DIAGNOSIS — Z471 Aftercare following joint replacement surgery: Secondary | ICD-10-CM | POA: Diagnosis not present

## 2024-11-08 DIAGNOSIS — M4316 Spondylolisthesis, lumbar region: Secondary | ICD-10-CM | POA: Diagnosis not present

## 2024-11-14 DIAGNOSIS — Z471 Aftercare following joint replacement surgery: Secondary | ICD-10-CM | POA: Diagnosis not present

## 2024-11-14 DIAGNOSIS — M4316 Spondylolisthesis, lumbar region: Secondary | ICD-10-CM | POA: Diagnosis not present

## 2024-11-15 ENCOUNTER — Other Ambulatory Visit (HOSPITAL_COMMUNITY): Payer: Self-pay

## 2024-11-15 ENCOUNTER — Other Ambulatory Visit: Payer: Self-pay

## 2024-11-15 MED ORDER — ROSUVASTATIN CALCIUM 40 MG PO TABS
40.0000 mg | ORAL_TABLET | Freq: Every day | ORAL | 1 refills | Status: AC
  Filled 2024-11-15: qty 90, 90d supply, fill #0

## 2024-11-22 DIAGNOSIS — M4316 Spondylolisthesis, lumbar region: Secondary | ICD-10-CM | POA: Diagnosis not present

## 2024-11-22 DIAGNOSIS — Z471 Aftercare following joint replacement surgery: Secondary | ICD-10-CM | POA: Diagnosis not present

## 2024-11-24 ENCOUNTER — Other Ambulatory Visit (HOSPITAL_COMMUNITY): Payer: Self-pay

## 2024-11-29 DIAGNOSIS — Z471 Aftercare following joint replacement surgery: Secondary | ICD-10-CM | POA: Diagnosis not present

## 2024-11-29 DIAGNOSIS — M4316 Spondylolisthesis, lumbar region: Secondary | ICD-10-CM | POA: Diagnosis not present

## 2024-12-01 DIAGNOSIS — Z471 Aftercare following joint replacement surgery: Secondary | ICD-10-CM | POA: Diagnosis not present

## 2024-12-01 DIAGNOSIS — M4316 Spondylolisthesis, lumbar region: Secondary | ICD-10-CM | POA: Diagnosis not present

## 2024-12-23 ENCOUNTER — Other Ambulatory Visit (HOSPITAL_COMMUNITY): Payer: Self-pay

## 2024-12-30 ENCOUNTER — Other Ambulatory Visit (HOSPITAL_COMMUNITY): Payer: Self-pay

## 2025-01-02 ENCOUNTER — Telehealth: Payer: Self-pay | Admitting: Family Medicine

## 2025-01-02 NOTE — Telephone Encounter (Signed)
 Pt has scheduled Zilretta  Bil Knees 01/30/2025. [employer] insurance.

## 2025-01-02 NOTE — Telephone Encounter (Signed)
 Zilretta  benefits ran for bilateral knee case # [phone]

## 2025-01-03 NOTE — Telephone Encounter (Signed)
 Noted

## 2025-01-03 NOTE — Telephone Encounter (Signed)
 Scheduled 01/30/25  Zilretta  authorized for bilateral knee NO PA REQUIRED Patient responsible for 205 coinsurance Copay $25 Deductible does not apply OOP MAX $3900 has met $25 Once OOP has been met coverage goes to 100% and copay will no longer apply REFERENCE # [id number]

## 2025-01-13 ENCOUNTER — Other Ambulatory Visit (HOSPITAL_COMMUNITY): Payer: Self-pay

## 2025-01-13 MED ORDER — METRONIDAZOLE 500 MG PO TABS
ORAL_TABLET | ORAL | 0 refills | Status: AC
  Filled 2025-01-13: qty 15, 5d supply, fill #0

## 2025-01-13 MED ORDER — CIPROFLOXACIN HCL 500 MG PO TABS
ORAL_TABLET | ORAL | 0 refills | Status: AC
  Filled 2025-01-13: qty 10, 5d supply, fill #0

## 2025-01-14 ENCOUNTER — Other Ambulatory Visit (HOSPITAL_COMMUNITY): Payer: Self-pay

## 2025-01-15 ENCOUNTER — Other Ambulatory Visit: Payer: Self-pay

## 2025-01-26 NOTE — Progress Notes (Unsigned)
"       ° °  [name], PhD, LAT, ATC acting as a scribe for Artist Lloyd, MD.  Gust KATHEE Moll is a 74 y.o. female who presents to [facility] at [facility] today for exacerbation of her bilat knee pain. Pt was last seen by Dr. Lloyd on 10/25/24 and was given repeat bilat Zilretta  injections.  Today, pt reports ***   Dx testing: 05/09/24 L knee XR 01/04/24 R knee XR             03/12/23 L LE vasc US              06/02/22 L knee MRI  Pertinent review of systems: ***  Relevant historical information: ***   Exam:  LMP  (LMP Unknown)  General: Well Developed, well nourished, and in no acute distress.   MSK: ***    Lab and Radiology Results No results found for this or any previous visit (from the past 72 hours). No results found.     Assessment and Plan: 74 y.o. female with ***   PDMP not reviewed this encounter. No orders of the defined types were placed in this encounter.  No orders of the defined types were placed in this encounter.    Discussed warning signs or symptoms. Please see discharge instructions. Patient expresses understanding.   ***  "

## 2025-01-27 ENCOUNTER — Other Ambulatory Visit (HOSPITAL_COMMUNITY): Payer: Self-pay

## 2025-01-27 MED ORDER — PREDNISONE 5 MG PO TABS: 5.0000 mg | ORAL_TABLET | Freq: Every day | ORAL | 3 refills | Status: AC

## 2025-01-30 ENCOUNTER — Ambulatory Visit: Admitting: Family Medicine
